# Patient Record
Sex: Male | Born: 1945 | Race: White | Hispanic: No | Marital: Married | Smoking: Former smoker
Health system: Southern US, Community
[De-identification: ages and names within clinical notes are randomized; demographics above are authoritative.]

## PROBLEM LIST (undated history)

## (undated) DIAGNOSIS — I10 Essential (primary) hypertension: Secondary | ICD-10-CM

## (undated) DIAGNOSIS — F32A Depression, unspecified: Secondary | ICD-10-CM

## (undated) DIAGNOSIS — I639 Cerebral infarction, unspecified: Secondary | ICD-10-CM

## (undated) DIAGNOSIS — K219 Gastro-esophageal reflux disease without esophagitis: Secondary | ICD-10-CM

## (undated) DIAGNOSIS — R339 Retention of urine, unspecified: Secondary | ICD-10-CM

## (undated) DIAGNOSIS — F329 Major depressive disorder, single episode, unspecified: Secondary | ICD-10-CM

## (undated) DIAGNOSIS — M549 Dorsalgia, unspecified: Secondary | ICD-10-CM

## (undated) DIAGNOSIS — H409 Unspecified glaucoma: Secondary | ICD-10-CM

## (undated) DIAGNOSIS — E785 Hyperlipidemia, unspecified: Secondary | ICD-10-CM

## (undated) HISTORY — PX: OTHER SURGICAL HISTORY: SHX169

## (undated) HISTORY — DX: Dorsalgia, unspecified: M54.9

## (undated) HISTORY — DX: Cerebral infarction, unspecified: I63.9

## (undated) HISTORY — DX: Essential (primary) hypertension: I10

## (undated) HISTORY — DX: Gastro-esophageal reflux disease without esophagitis: K21.9

## (undated) HISTORY — DX: Hyperlipidemia, unspecified: E78.5

## (undated) HISTORY — DX: Unspecified glaucoma: H40.9

## (undated) HISTORY — DX: Retention of urine, unspecified: R33.9

## (undated) HISTORY — PX: COLONOSCOPY: SHX174

## (undated) HISTORY — DX: Major depressive disorder, single episode, unspecified: F32.9

## (undated) HISTORY — DX: Depression, unspecified: F32.A

---

## 2003-05-21 ENCOUNTER — Encounter (INDEPENDENT_AMBULATORY_CARE_PROVIDER_SITE_OTHER): Payer: Self-pay | Admitting: Specialist

## 2003-05-21 ENCOUNTER — Ambulatory Visit (HOSPITAL_COMMUNITY): Admission: RE | Admit: 2003-05-21 | Discharge: 2003-05-21 | Payer: Self-pay | Admitting: Gastroenterology

## 2003-11-19 ENCOUNTER — Encounter: Admission: RE | Admit: 2003-11-19 | Discharge: 2003-11-19 | Payer: Self-pay | Admitting: Internal Medicine

## 2005-04-05 HISTORY — PX: KNEE ARTHROSCOPY: SUR90

## 2006-04-28 ENCOUNTER — Ambulatory Visit: Payer: Self-pay | Admitting: Family Medicine

## 2006-05-07 ENCOUNTER — Ambulatory Visit: Payer: Self-pay | Admitting: Family Medicine

## 2006-05-07 LAB — CONVERTED CEMR LAB
ALT: 19 units/L (ref 0–40)
AST: 25 units/L (ref 0–37)
Cholesterol: 250 mg/dL (ref 0–200)
Direct LDL: 157.7 mg/dL
Glucose, Bld: 89 mg/dL (ref 70–99)
HDL: 47.5 mg/dL (ref >39.0)
PSA: 3.21 ng/mL (ref 0.10–4.00)
Total CHOL/HDL Ratio: 5.3
Triglycerides: 115 mg/dL (ref 0–149)
VLDL: 23 mg/dL (ref 0–40)

## 2006-05-19 ENCOUNTER — Encounter (INDEPENDENT_AMBULATORY_CARE_PROVIDER_SITE_OTHER): Payer: Self-pay | Admitting: Family Medicine

## 2006-05-19 ENCOUNTER — Ambulatory Visit: Payer: Self-pay | Admitting: Family Medicine

## 2006-05-19 ENCOUNTER — Encounter: Payer: Self-pay | Admitting: Family Medicine

## 2006-05-20 ENCOUNTER — Ambulatory Visit (HOSPITAL_BASED_OUTPATIENT_CLINIC_OR_DEPARTMENT_OTHER): Admission: RE | Admit: 2006-05-20 | Discharge: 2006-05-20 | Payer: Self-pay | Admitting: Orthopedic Surgery

## 2006-06-18 ENCOUNTER — Telehealth (INDEPENDENT_AMBULATORY_CARE_PROVIDER_SITE_OTHER): Payer: Self-pay | Admitting: *Deleted

## 2006-07-27 ENCOUNTER — Ambulatory Visit: Payer: Self-pay | Admitting: Family Medicine

## 2006-08-02 ENCOUNTER — Telehealth (INDEPENDENT_AMBULATORY_CARE_PROVIDER_SITE_OTHER): Payer: Self-pay | Admitting: *Deleted

## 2006-08-02 LAB — CONVERTED CEMR LAB
ALT: 18 units/L (ref 0–53)
AST: 24 units/L (ref 0–37)
Cholesterol: 205 mg/dL (ref 0–200)
Direct LDL: 117 mg/dL
Glucose, Bld: 102 mg/dL — ABNORMAL HIGH (ref 70–99)
HDL: 48.2 mg/dL (ref >39.0)
PSA: 2.67 ng/mL (ref 0.10–4.00)
Total CHOL/HDL Ratio: 4.3
Triglycerides: 98 mg/dL (ref 0–149)
VLDL: 20 mg/dL (ref 0–40)

## 2006-10-14 ENCOUNTER — Ambulatory Visit: Payer: Self-pay | Admitting: Family Medicine

## 2006-10-14 DIAGNOSIS — M549 Dorsalgia, unspecified: Secondary | ICD-10-CM | POA: Insufficient documentation

## 2006-10-14 LAB — CONVERTED CEMR LAB: PSA: 1.92 ng/mL (ref 0.10–4.00)

## 2006-11-15 ENCOUNTER — Telehealth (INDEPENDENT_AMBULATORY_CARE_PROVIDER_SITE_OTHER): Payer: Self-pay | Admitting: *Deleted

## 2006-11-18 ENCOUNTER — Ambulatory Visit: Payer: Self-pay | Admitting: Family Medicine

## 2006-11-18 DIAGNOSIS — E785 Hyperlipidemia, unspecified: Secondary | ICD-10-CM | POA: Insufficient documentation

## 2006-11-18 LAB — CONVERTED CEMR LAB
Cholesterol, target level: 200 mg/dL
HDL goal, serum: 40 mg/dL
LDL Goal: 160 mg/dL

## 2007-01-19 ENCOUNTER — Telehealth (INDEPENDENT_AMBULATORY_CARE_PROVIDER_SITE_OTHER): Payer: Self-pay | Admitting: *Deleted

## 2008-01-30 ENCOUNTER — Ambulatory Visit: Payer: Self-pay | Admitting: Internal Medicine

## 2008-06-22 ENCOUNTER — Ambulatory Visit: Payer: Self-pay | Admitting: Internal Medicine

## 2008-12-24 ENCOUNTER — Encounter: Payer: Self-pay | Admitting: Family Medicine

## 2008-12-24 ENCOUNTER — Encounter: Payer: Self-pay | Admitting: Emergency Medicine

## 2008-12-24 ENCOUNTER — Ambulatory Visit: Payer: Self-pay | Admitting: Diagnostic Radiology

## 2008-12-25 ENCOUNTER — Inpatient Hospital Stay (HOSPITAL_COMMUNITY): Admission: EM | Admit: 2008-12-25 | Discharge: 2008-12-27 | Payer: Self-pay | Admitting: Emergency Medicine

## 2008-12-25 ENCOUNTER — Encounter: Payer: Self-pay | Admitting: Family Medicine

## 2008-12-25 ENCOUNTER — Telehealth: Payer: Self-pay | Admitting: Family Medicine

## 2008-12-25 DIAGNOSIS — Z8679 Personal history of other diseases of the circulatory system: Secondary | ICD-10-CM | POA: Insufficient documentation

## 2008-12-26 ENCOUNTER — Ambulatory Visit: Payer: Self-pay | Admitting: Vascular Surgery

## 2008-12-26 ENCOUNTER — Encounter (INDEPENDENT_AMBULATORY_CARE_PROVIDER_SITE_OTHER): Payer: Self-pay | Admitting: Internal Medicine

## 2008-12-26 ENCOUNTER — Encounter: Payer: Self-pay | Admitting: Family Medicine

## 2008-12-26 ENCOUNTER — Ambulatory Visit: Payer: Self-pay | Admitting: Cardiology

## 2008-12-26 ENCOUNTER — Telehealth (INDEPENDENT_AMBULATORY_CARE_PROVIDER_SITE_OTHER): Payer: Self-pay | Admitting: *Deleted

## 2008-12-27 ENCOUNTER — Ambulatory Visit: Payer: Self-pay | Admitting: Physical Medicine & Rehabilitation

## 2008-12-27 ENCOUNTER — Inpatient Hospital Stay (HOSPITAL_COMMUNITY)
Admission: RE | Admit: 2008-12-27 | Discharge: 2009-01-02 | Payer: Self-pay | Admitting: Physical Medicine & Rehabilitation

## 2009-01-02 ENCOUNTER — Encounter: Payer: Self-pay | Admitting: Family Medicine

## 2009-01-07 ENCOUNTER — Encounter
Admission: RE | Admit: 2009-01-07 | Discharge: 2009-03-05 | Payer: Self-pay | Admitting: Physical Medicine & Rehabilitation

## 2009-01-09 ENCOUNTER — Encounter (INDEPENDENT_AMBULATORY_CARE_PROVIDER_SITE_OTHER): Payer: Self-pay | Admitting: *Deleted

## 2009-01-18 ENCOUNTER — Ambulatory Visit: Payer: Self-pay | Admitting: Family Medicine

## 2009-01-18 DIAGNOSIS — I519 Heart disease, unspecified: Secondary | ICD-10-CM

## 2009-02-04 ENCOUNTER — Encounter
Admission: RE | Admit: 2009-02-04 | Discharge: 2009-02-05 | Payer: Self-pay | Admitting: Physical Medicine & Rehabilitation

## 2009-02-05 ENCOUNTER — Ambulatory Visit: Payer: Self-pay | Admitting: Physical Medicine & Rehabilitation

## 2009-02-05 ENCOUNTER — Encounter: Payer: Self-pay | Admitting: Family Medicine

## 2009-02-05 ENCOUNTER — Telehealth: Payer: Self-pay | Admitting: Family Medicine

## 2009-02-05 DIAGNOSIS — Z8659 Personal history of other mental and behavioral disorders: Secondary | ICD-10-CM

## 2009-02-06 ENCOUNTER — Ambulatory Visit: Payer: Self-pay | Admitting: Cardiology

## 2009-02-27 ENCOUNTER — Telehealth: Payer: Self-pay | Admitting: Cardiology

## 2009-03-18 ENCOUNTER — Ambulatory Visit: Payer: Self-pay | Admitting: Family Medicine

## 2009-03-22 ENCOUNTER — Telehealth: Payer: Self-pay | Admitting: Cardiology

## 2009-03-22 ENCOUNTER — Ambulatory Visit: Payer: Self-pay

## 2009-03-25 LAB — CONVERTED CEMR LAB
ALT: 27 units/L (ref 0–53)
AST: 30 units/L (ref 0–37)
Albumin: 3.6 g/dL (ref 3.5–5.2)
Alkaline Phosphatase: 64 units/L (ref 39–117)
BUN: 18 mg/dL (ref 6–23)
Bilirubin, Direct: 0.1 mg/dL (ref 0.0–0.3)
CO2: 31 meq/L (ref 19–32)
Calcium: 8.9 mg/dL (ref 8.4–10.5)
Chloride: 109 meq/L (ref 96–112)
Cholesterol: 150 mg/dL (ref 0–200)
Creatinine, Ser: 0.9 mg/dL (ref 0.4–1.5)
GFR calc non Af Amer: 89.91 mL/min (ref >60)
Glucose, Bld: 104 mg/dL — ABNORMAL HIGH (ref 70–99)
HDL: 61.6 mg/dL (ref >39.00)
LDL Cholesterol: 66 mg/dL (ref 0–99)
PSA: 2.59 ng/mL (ref 0.10–4.00)
Potassium: 4.2 meq/L (ref 3.5–5.1)
Sodium: 142 meq/L (ref 135–145)
Total Bilirubin: 0.8 mg/dL (ref 0.3–1.2)
Total CHOL/HDL Ratio: 2
Total Protein: 6.5 g/dL (ref 6.0–8.3)
Triglycerides: 110 mg/dL (ref 0.0–149.0)
VLDL: 22 mg/dL (ref 0.0–40.0)

## 2009-05-21 ENCOUNTER — Telehealth: Payer: Self-pay | Admitting: Cardiology

## 2009-05-23 ENCOUNTER — Encounter: Payer: Self-pay | Admitting: Cardiology

## 2009-05-30 ENCOUNTER — Encounter: Payer: Self-pay | Admitting: Family Medicine

## 2009-06-05 ENCOUNTER — Ambulatory Visit: Payer: Self-pay | Admitting: Cardiology

## 2009-06-05 ENCOUNTER — Ambulatory Visit (HOSPITAL_COMMUNITY): Admission: RE | Admit: 2009-06-05 | Discharge: 2009-06-05 | Payer: Self-pay | Admitting: Cardiology

## 2009-06-05 ENCOUNTER — Encounter: Payer: Self-pay | Admitting: Cardiology

## 2009-06-20 ENCOUNTER — Encounter: Payer: Self-pay | Admitting: Family Medicine

## 2009-07-01 ENCOUNTER — Telehealth (INDEPENDENT_AMBULATORY_CARE_PROVIDER_SITE_OTHER): Payer: Self-pay | Admitting: *Deleted

## 2009-07-09 ENCOUNTER — Ambulatory Visit: Payer: Self-pay | Admitting: Family Medicine

## 2009-07-15 LAB — CONVERTED CEMR LAB
ALT: 24 units/L (ref 0–53)
AST: 26 units/L (ref 0–37)
Albumin: 4 g/dL (ref 3.5–5.2)
Alkaline Phosphatase: 69 units/L (ref 39–117)
BUN: 25 mg/dL — ABNORMAL HIGH (ref 6–23)
Bilirubin, Direct: 0.2 mg/dL (ref 0.0–0.3)
CO2: 29 meq/L (ref 19–32)
Calcium: 9 mg/dL (ref 8.4–10.5)
Chloride: 106 meq/L (ref 96–112)
Cholesterol: 157 mg/dL (ref 0–200)
Creatinine, Ser: 0.9 mg/dL (ref 0.4–1.5)
GFR calc non Af Amer: 86.49 mL/min (ref >60)
Glucose, Bld: 102 mg/dL — ABNORMAL HIGH (ref 70–99)
HDL: 55.2 mg/dL (ref >39.00)
Hgb A1c MFr Bld: 5.8 % (ref 4.6–6.5)
LDL Cholesterol: 83 mg/dL (ref 0–99)
Potassium: 4.5 meq/L (ref 3.5–5.1)
Sodium: 141 meq/L (ref 135–145)
Total Bilirubin: 0.8 mg/dL (ref 0.3–1.2)
Total CHOL/HDL Ratio: 3
Total Protein: 6.5 g/dL (ref 6.0–8.3)
Triglycerides: 94 mg/dL (ref 0.0–149.0)
VLDL: 18.8 mg/dL (ref 0.0–40.0)

## 2009-07-19 ENCOUNTER — Ambulatory Visit: Payer: Self-pay | Admitting: Family Medicine

## 2009-07-19 DIAGNOSIS — M1990 Unspecified osteoarthritis, unspecified site: Secondary | ICD-10-CM | POA: Insufficient documentation

## 2009-07-19 DIAGNOSIS — I10 Essential (primary) hypertension: Secondary | ICD-10-CM | POA: Insufficient documentation

## 2009-07-25 ENCOUNTER — Encounter: Payer: Self-pay | Admitting: Cardiology

## 2009-07-26 ENCOUNTER — Encounter: Payer: Self-pay | Admitting: Family Medicine

## 2009-07-29 ENCOUNTER — Encounter: Payer: Self-pay | Admitting: Family Medicine

## 2009-07-29 ENCOUNTER — Emergency Department (HOSPITAL_COMMUNITY): Admission: EM | Admit: 2009-07-29 | Discharge: 2009-07-29 | Payer: Self-pay | Admitting: Emergency Medicine

## 2009-08-06 ENCOUNTER — Ambulatory Visit: Payer: Self-pay | Admitting: Family Medicine

## 2009-09-05 ENCOUNTER — Ambulatory Visit: Payer: Self-pay | Admitting: Family Medicine

## 2009-09-05 DIAGNOSIS — R350 Frequency of micturition: Secondary | ICD-10-CM

## 2009-09-05 HISTORY — PX: KNEE SURGERY: SHX244

## 2009-09-06 ENCOUNTER — Telehealth: Payer: Self-pay | Admitting: Family Medicine

## 2009-09-10 ENCOUNTER — Telehealth: Payer: Self-pay | Admitting: Family Medicine

## 2009-09-10 ENCOUNTER — Encounter: Payer: Self-pay | Admitting: Family Medicine

## 2009-09-13 ENCOUNTER — Ambulatory Visit: Payer: Self-pay | Admitting: Family Medicine

## 2009-09-13 ENCOUNTER — Telehealth (INDEPENDENT_AMBULATORY_CARE_PROVIDER_SITE_OTHER): Payer: Self-pay | Admitting: *Deleted

## 2009-09-16 LAB — CONVERTED CEMR LAB: Vit D, 25-Hydroxy: 34 ng/mL (ref 30–89)

## 2009-10-11 ENCOUNTER — Encounter: Payer: Self-pay | Admitting: Family Medicine

## 2009-12-10 ENCOUNTER — Ambulatory Visit: Payer: Self-pay | Admitting: Family Medicine

## 2009-12-10 DIAGNOSIS — M169 Osteoarthritis of hip, unspecified: Secondary | ICD-10-CM

## 2009-12-10 DIAGNOSIS — M1610 Unilateral primary osteoarthritis, unspecified hip: Secondary | ICD-10-CM | POA: Insufficient documentation

## 2009-12-10 DIAGNOSIS — R7309 Other abnormal glucose: Secondary | ICD-10-CM

## 2009-12-16 HISTORY — PX: TOTAL HIP ARTHROPLASTY: SHX124

## 2009-12-17 ENCOUNTER — Encounter: Payer: Self-pay | Admitting: Family Medicine

## 2009-12-18 ENCOUNTER — Telehealth (INDEPENDENT_AMBULATORY_CARE_PROVIDER_SITE_OTHER): Payer: Self-pay | Admitting: *Deleted

## 2009-12-19 ENCOUNTER — Encounter: Payer: Self-pay | Admitting: Family Medicine

## 2009-12-23 LAB — CONVERTED CEMR LAB
ALT: 19 units/L (ref 0–53)
AST: 27 units/L (ref 0–37)
Albumin: 3.8 g/dL (ref 3.5–5.2)
Alkaline Phosphatase: 53 units/L (ref 39–117)
BUN: 26 mg/dL — ABNORMAL HIGH (ref 6–23)
Basophils Absolute: 0.1 10*3/uL (ref 0.0–0.1)
Basophils Relative: 0.8 % (ref 0.0–3.0)
Bilirubin Urine: NEGATIVE
Bilirubin, Direct: 0.1 mg/dL (ref 0.0–0.3)
CO2: 28 meq/L (ref 19–32)
Calcium: 8.8 mg/dL (ref 8.4–10.5)
Chloride: 105 meq/L (ref 96–112)
Cholesterol: 156 mg/dL (ref 0–200)
Creatinine, Ser: 0.9 mg/dL (ref 0.4–1.5)
Creatinine,U: 183.4 mg/dL
Eosinophils Absolute: 0.2 10*3/uL (ref 0.0–0.7)
Eosinophils Relative: 3 % (ref 0.0–5.0)
GFR calc non Af Amer: 86.38 mL/min (ref >60.00)
Glucose, Bld: 105 mg/dL — ABNORMAL HIGH (ref 70–99)
HCT: 41.8 % (ref 39.0–52.0)
HDL: 51.6 mg/dL (ref >39.00)
Hemoglobin: 14.1 g/dL (ref 13.0–17.0)
Hgb A1c MFr Bld: 5.7 % (ref 4.6–6.5)
Ketones, ur: NEGATIVE mg/dL
LDL Cholesterol: 89 mg/dL (ref 0–99)
Leukocytes, UA: NEGATIVE
Lymphocytes Relative: 35.3 % (ref 12.0–46.0)
Lymphs Abs: 2.5 10*3/uL (ref 0.7–4.0)
MCHC: 33.8 g/dL (ref 30.0–36.0)
MCV: 93.3 fL (ref 78.0–100.0)
Microalb Creat Ratio: 0.7 mg/g (ref 0.0–30.0)
Microalb, Ur: 1.2 mg/dL (ref 0.0–1.9)
Monocytes Absolute: 0.6 10*3/uL (ref 0.1–1.0)
Monocytes Relative: 9.1 % (ref 3.0–12.0)
Neutro Abs: 3.6 10*3/uL (ref 1.4–7.7)
Neutrophils Relative %: 51.8 % (ref 43.0–77.0)
Nitrite: NEGATIVE
Platelets: 192 10*3/uL (ref 150.0–400.0)
Potassium: 4.3 meq/L (ref 3.5–5.1)
RBC: 4.49 M/uL (ref 4.22–5.81)
RDW: 13.9 % (ref 11.5–14.6)
Sodium: 141 meq/L (ref 135–145)
Specific Gravity, Urine: 1.025 (ref 1.000–1.030)
Total Bilirubin: 0.6 mg/dL (ref 0.3–1.2)
Total CHOL/HDL Ratio: 3
Total Protein, Urine: NEGATIVE mg/dL
Total Protein: 6.2 g/dL (ref 6.0–8.3)
Triglycerides: 76 mg/dL (ref 0.0–149.0)
Urine Glucose: NEGATIVE mg/dL
Urobilinogen, UA: 0.2 (ref 0.0–1.0)
VLDL: 15.2 mg/dL (ref 0.0–40.0)
WBC: 7 10*3/uL (ref 4.5–10.5)
pH: 6 (ref 5.0–8.0)

## 2009-12-24 ENCOUNTER — Ambulatory Visit: Payer: Self-pay | Admitting: Family Medicine

## 2010-01-30 ENCOUNTER — Encounter: Payer: Self-pay | Admitting: Family Medicine

## 2010-02-02 LAB — CONVERTED CEMR LAB
ALT: 23 units/L (ref 0–53)
AST: 27 units/L (ref 0–37)
Albumin: 3.8 g/dL (ref 3.5–5.2)
Alkaline Phosphatase: 58 units/L (ref 39–117)
BUN: 23 mg/dL (ref 6–23)
Basophils Absolute: 0 10*3/uL (ref 0.0–0.1)
Basophils Relative: 0.8 % (ref 0.0–3.0)
Bilirubin, Direct: 0.2 mg/dL (ref 0.0–0.3)
CO2: 29 meq/L (ref 19–32)
Calcium: 8.8 mg/dL (ref 8.4–10.5)
Chloride: 103 meq/L (ref 96–112)
Cholesterol: 135 mg/dL (ref 0–200)
Creatinine, Ser: 1 mg/dL (ref 0.4–1.5)
Eosinophils Absolute: 0.1 10*3/uL (ref 0.0–0.7)
Eosinophils Relative: 1.7 % (ref 0.0–5.0)
GFR calc non Af Amer: 84.35 mL/min (ref >60)
Glucose, Bld: 91 mg/dL (ref 70–99)
HCT: 42.3 % (ref 39.0–52.0)
HDL: 50.4 mg/dL (ref >39.00)
Hemoglobin: 14.4 g/dL (ref 13.0–17.0)
Hgb A1c MFr Bld: 5.8 % (ref 4.6–6.5)
LDL Cholesterol: 73 mg/dL (ref 0–99)
Lymphocytes Relative: 29.2 % (ref 12.0–46.0)
Lymphs Abs: 1.9 10*3/uL (ref 0.7–4.0)
MCHC: 34.1 g/dL (ref 30.0–36.0)
MCV: 93.6 fL (ref 78.0–100.0)
Monocytes Absolute: 0.6 10*3/uL (ref 0.1–1.0)
Monocytes Relative: 9.1 % (ref 3.0–12.0)
Neutro Abs: 3.8 10*3/uL (ref 1.4–7.7)
Neutrophils Relative %: 59.2 % (ref 43.0–77.0)
PSA: 2.64 ng/mL (ref 0.10–4.00)
Platelets: 201 10*3/uL (ref 150.0–400.0)
Potassium: 4.7 meq/L (ref 3.5–5.1)
RBC: 4.52 M/uL (ref 4.22–5.81)
RDW: 13.7 % (ref 11.5–14.6)
Sodium: 140 meq/L (ref 135–145)
TSH: 1.53 microintl units/mL (ref 0.35–5.50)
Total Bilirubin: 1 mg/dL (ref 0.3–1.2)
Total CHOL/HDL Ratio: 3
Total Protein: 6.2 g/dL (ref 6.0–8.3)
Triglycerides: 59 mg/dL (ref 0.0–149.0)
VLDL: 11.8 mg/dL (ref 0.0–40.0)
WBC: 6.4 10*3/uL (ref 4.5–10.5)

## 2010-02-04 NOTE — Assessment & Plan Note (Signed)
Summary: discuss issue from specialist/cbs   Vital Signs:  Patient profile:   65 year old male Height:      73.5 inches Weight:      196 pounds Temp:     98.4 degrees F oral Pulse rate:   68 / minute BP sitting:   130 / 100  (left arm)  Vitals Entered By: Jeremy Johann CMA (July 19, 2009 8:18 AM)  Serial Vital Signs/Assessments:  Time      Position  BP       Pulse  Resp  Temp     By 8:36 AM             128/100                        Loreen Freud DO  CC: discuss issue from specialist, blood sugars Comments REVIEWED MED LIST, PATIENT AGREED DOSE AND INSTRUCTION CORRECT    History of Present Illness: Pt here to discuss issues with ortho.  He is having surgery in [place], [place]---He is going to have hip resurfacing and no one here does that.  He will also need partial knee replacement on the Right.  No other complaints.  Preventive Screening-Counseling & Management  Alcohol-Tobacco     Alcohol drinks/day: <1     Smoking Status: never  Caffeine-Diet-Exercise     Does Patient Exercise: yes     Type of exercise: swimming, weights, bike     Exercise (avg: min/session): >60     Times/week: 5     Exercise Counseling: to improve exercise regimen  Current Medications (verified): 1)  Zoloft 100 Mg  Tabs (Sertraline Hcl) .... Take One Tablet Daily 2)  Lovaza 1 Gm Caps (Omega-3-Acid Ethyl Esters) .... 2 By Mouth Two Times A Day 3)  Glucosamine-Chondroitin 1500-1200 Mg/65ml  Liqd (Glucosamine-Chondroitin) 4)  Ecotrin 325 Mg Tbec (Aspirin) .Marland Kitchen.. 1 By Mouth Once Daily 5)  Sam-E 200 Mg Tabs (S-Adenosylmethionine) 6)  Multivitamins  Tabs (Multiple Vitamin) .Marland Kitchen.. 1 By Mouth Once Daily 7)  Crestor 20 Mg Tabs (Rosuvastatin Calcium) .Marland Kitchen.. 1 By Mouth At Bedtime. 8)  Lisinopril 10 Mg Tabs (Lisinopril) .Marland Kitchen.. 1 By Mouth Once Daily  Allergies (verified): No Known Drug Allergies  Past History:  Past medical, surgical, family and social histories (including risk factors) reviewed for relevance to  current acute and chronic problems.  Past Medical History: Reviewed history from 02/06/2009 and no changes required. 1. HYPERLIPIDEMIA (ICD-272.4) 2. 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  Carotid dopplers showed no evidence of stenosis.   3.  DEPRESSION, HX OF (ICD-V11.8) 4. BACK PAIN (ICD-724.5) 5. History of right eye retinal detachment.   6. Echo (12/10): Technically difficult study.  EF 50-55%.  Grade I diastolic dysfucntion.   Past Surgical History: Reviewed history from 02/05/2009 and no changes required.  Right knee arthroscopy.   Family History: Reviewed history from 02/05/2009 and no changes required. Family History of Stroke M 1st degree relative--  42s--- ICH Family History High cholesterol  Social History: Reviewed history from 02/06/2009 and no changes required. Married, lives at [place].  Quit tobacco 1969  Alcohol use-no Drug use-no Regular exercise-yes Occupation: Systems analyst  Review of Systems      See HPI  Physical Exam  General:  Well-developed,well-nourished,in no acute distress; alert,appropriate and cooperative throughout examination Lungs:  Normal respiratory effort, chest expands symmetrically. Lungs are clear to auscultation, no crackles or wheezes. Heart:  normal rate and no murmur.   Extremities:  No clubbing, cyanosis, edema, or deformity noted with normal full range of motion of all joints.   Psych:  Oriented X3 and normally interactive.     Impression & Recommendations:  Problem # 1:  UNSPECIFIED ESSENTIAL HYPERTENSION (ICD-401.9)  His updated medication list for this problem includes:    Lisinopril 10 Mg Tabs (Lisinopril) .Marland Kitchen... 1 by mouth once daily  BP today: 130/100 Prior BP: 138/90 (02/06/2009)  Prior 10 Yr Risk Heart Disease: Not enough information (11/18/2006)  Labs Reviewed: K+: 4.5  (07/09/2009) Creat: : 0.9 (07/09/2009)   Chol: 157 (07/09/2009)   HDL: 55.20 (07/09/2009)   LDL: 83 (07/09/2009)   TG: 94.0 (07/09/2009)  Problem # 2:  DEGENERATIVE JOINT DISEASE, ADVANCED (ICD-715.90)  rto for surgical clearance for R knee replacement and b/l hip resurfacing His updated medication list for this problem includes:    Ecotrin 325 Mg Tbec (Aspirin) .Marland Kitchen... 1 by mouth once daily  Discussed use of medications, application of heat or cold, and exercises.   Complete Medication List: 1)  Zoloft 100 Mg Tabs (Sertraline hcl) .... Take one tablet daily 2)  Lovaza 1 Gm Caps (Omega-3-acid ethyl esters) .... 2 by mouth two times a day 3)  Glucosamine-chondroitin 1500-1200 Mg/21ml Liqd (Glucosamine-chondroitin) 4)  Ecotrin 325 Mg Tbec (Aspirin) .Marland Kitchen.. 1 by mouth once daily 5)  Sam-e 200 Mg Tabs (S-adenosylmethionine) 6)  Multivitamins Tabs (Multiple vitamin) .Marland Kitchen.. 1 by mouth once daily 7)  Crestor 20 Mg Tabs (Rosuvastatin calcium) .Marland Kitchen.. 1 by mouth at bedtime. 8)  Lisinopril 10 Mg Tabs (Lisinopril) .Marland Kitchen.. 1 by mouth once daily  Patient Instructions: 1)  Please schedule a follow-up appointment in 2 weeks.  2)  Pt needs surgical clearance/CPE before september Prescriptions: LISINOPRIL 10 MG TABS (LISINOPRIL) 1 by mouth once daily  #30 x 2   Entered and Authorized by:   Loreen Freud DO   Signed by:   Loreen Freud DO on 07/19/2009   Method used:   Electronically to        [facility] [phone]* (retail)       [address]       [place], [place]  [address]       Ph: [phone]       Fax: [phone]   RxID:   [id number]

## 2010-02-04 NOTE — Progress Notes (Signed)
Summary: lab [phone]  Phone Note Outgoing Call   Call placed by: Army Fossa CMA,  July 01, 2009 8:41 AM Reason for Call: Confirm/change Appt Summary of Call: Pt needs to schedule labwork:  -272.4  790.6  bmp, hgba1c, hep, lipid    Follow-up for Phone Call        lm am to schedule appt  .Marland KitchenOkey [place]  June 30, 2009 10:43 PM  lmom to schedule fasting lab .Marland KitchenOkey [place]  July 03, 2009 1:43 PM  patient returned call - lab appt [phone] .Marland KitchenOkey [place]  July 04, 2009 9:27 AM

## 2010-02-04 NOTE — Miscellaneous (Signed)
Summary: Case Mgmt. Form/[facility]  Case Mgmt. Form/[facility]   Imported By: Lanelle Bal 01/10/2009 09:02:39  _____________________________________________________________________  External Attachment:    Type:   Image     Comment:   External Document

## 2010-02-04 NOTE — Letter (Signed)
Summary: Surgical Clearance  Surgical Clearance   Imported By: Marylou Mccoy 08/02/2009 15:57:30  _____________________________________________________________________  External Attachment:    Type:   Image     Comment:   External Document

## 2010-02-04 NOTE — Letter (Signed)
Summary: Claudette Laws MD  Claudette Laws MD   Imported By: Lanelle Bal 08/15/2009 09:37:35  _____________________________________________________________________  External Attachment:    Type:   Image     Comment:   External Document

## 2010-02-04 NOTE — Letter (Signed)
Summary: Surgical Clearance/[facility]  Surgical Clearance/[facility]   Imported By: Lanelle Bal 10/07/2009 13:03:30  _____________________________________________________________________  External Attachment:    Type:   Image     Comment:   External Document

## 2010-02-04 NOTE — Progress Notes (Signed)
Summary: NEEDS PRESCRIPTION FOR CRESTOR 20 MG  Phone Note Call from Patient Call back at Home Phone [phone]   Caller: Patient Summary of Call: PATIENT NEEDS PRESCRIPTION FOR CRESTOR 20 MG---GIVEN TO HIM WHEN HE WAS IN THE HOSPITAL  HE IS OUT OF MEDICATION --TOOK LAST ONE TWO NIGHTS AGO  PLEASE CALL IN [facility] ON [address], THEN CALL HIM TO CONFIRM THAT PRESCRIPTION HAS BEEN CALLED IN Initial call taken by: Jerolyn Shin,  February 05, 2009 11:47 AM  Follow-up for Phone Call        We have on lovaza? Army Fossa CMA  February 05, 2009 12:02 PM   Additional Follow-up for Phone Call Additional follow up Details #1::        He is probably on both--- ok to call in crestor 20 mg #30  2 refills--- ov 3 months with fasting labs Additional Follow-up by: Loreen Freud DO,  February 05, 2009 12:37 PM    Additional Follow-up for Phone Call Additional follow up Details #2::    Called in medication. Army Fossa CMA  February 05, 2009 3:32 PM  Pt is aware.   New/Updated Medications: CRESTOR 20 MG TABS (ROSUVASTATIN CALCIUM) 1 by mouth at bedtime. Prescriptions: CRESTOR 20 MG TABS (ROSUVASTATIN CALCIUM) 1 by mouth at bedtime.  #30 x 2   Entered by:   Army Fossa CMA   Authorized by:   Loreen Freud DO   Signed by:   Army Fossa CMA on 02/06/2009   Method used:   Electronically to        [facility] [phone]* (retail)       [address]       [place], [place]  [address]       Ph: [phone]       Fax: [phone]   RxID:   [id number]   Appended Document: NEEDS PRESCRIPTION FOR CRESTOR 20 MG Was not sent electronically- called in on 02/05/09. I called and cancelled the rx at [facility] [address].

## 2010-02-04 NOTE — Letter (Signed)
Summary: Generic Letter  [facility] [place]  [address] [place], [place] [address]   Phone: [phone]  Fax: [phone]    09/10/2009  RE: Antonio Stephens   DOB  12/12/1945 [address] [place], [place]  [address]  To Whom It May Concern:  The above patient is cleared for surgery.  Enclosed are all the records from his recent exam.  Feel free to call with any further questions.            Sincerely,   Loreen Freud DO

## 2010-02-04 NOTE — Progress Notes (Signed)
Summary: GASTRO REFERRAL  Phone Note Outgoing Call   Call placed by: Magdalen Spatz [facility],  September 06, 2009 3:07 PM Call placed to: Specialist Summary of Call: IN REFERENCE TO GASTROENTEROLOGY REFERRAL....Marland KitchenPER [facility] THIS PATIENT'S LAST COLON WAS WITH DR. GANEM IN 2005 & HE IS NOT DUE UNTIL 2015.  I S/W PT HE IS AWARE Magdalen Spatz [facility]  September 06, 2009 3:06 PM

## 2010-02-04 NOTE — Progress Notes (Signed)
Summary: ? schedule TEE  Phone Note Outgoing Call   Call placed by: Katina Dung, RN, BSN,  February 27, 2009 3:06 PM Call placed to: Patient Summary of Call: ?schedule TEE  Follow-up for Phone Call        discussed with pt--pt had appt with Dr Pearlean Brownie 02-21-09-Dr Pearlean Brownie was not available to see pt at that time and appt was reschedued to 03-08-09--pt will call me back after he sees [facility] 03-08-09 and let me know his decision about scheduling the TEE

## 2010-02-04 NOTE — Assessment & Plan Note (Signed)
Summary: rto 2 weeks/cbs   Vital Signs:  Patient profile:   65 year old male Height:      73.5 inches (186.69 cm) Weight:      194.25 pounds (88.30 kg) BMI:     25.37 Temp:     98.5 degrees F (36.94 degrees C) oral BP sitting:   122 / 86  (left arm) Cuff size:   regular  Vitals Entered By: Lucious Groves CMA (August 06, 2009 4:40 PM) CC: 2 eek rtn ov./kb Is Patient Diabetic? No Pain Assessment Patient in pain? no      Comments Patient ntoes that he is in a research study right now. He also made me aware that 1 week ago he had an episode of nausea and being off balance, at which time he took himself to the ER. Per patient, everything checked out fine./kb   History of Present Illness: Pt here f/u ER for dizziness---MRI of brain normal.  Pt has felt fine since.    Current Medications (verified): 1)  Zoloft 100 Mg  Tabs (Sertraline Hcl) .... Take One Tablet Daily 2)  Lovaza 1 Gm Caps (Omega-3-Acid Ethyl Esters) .... 2 By Mouth Two Times A Day 3)  Glucosamine-Chondroitin 1500-1200 Mg/47ml  Liqd (Glucosamine-Chondroitin) 4)  Ecotrin 325 Mg Tbec (Aspirin) .Marland Kitchen.. 1 By Mouth Once Daily 5)  Sam-E 200 Mg Tabs (S-Adenosylmethionine) 6)  Multivitamins  Tabs (Multiple Vitamin) .Marland Kitchen.. 1 By Mouth Once Daily 7)  Crestor 20 Mg Tabs (Rosuvastatin Calcium) .Marland Kitchen.. 1 By Mouth At Bedtime. 8)  Lisinopril 10 Mg Tabs (Lisinopril) .Marland Kitchen.. 1 By Mouth Once Daily  Allergies (verified): No Known Drug Allergies  Past History:  Past Surgical History: Last updated: 02/05/2009  Right knee arthroscopy.   Family History: Last updated: 02/05/2009 Family History of Stroke M 1st degree relative--  59s--- ICH Family History High cholesterol  Social History: Last updated: 02/06/2009 Married, lives at [place].  Quit tobacco 1969                                                                                Alcohol use-no Drug use-no Regular exercise-yes Occupation: Systems analyst  Risk Factors: Alcohol Use:  <1 (07/19/2009) Exercise: yes (07/19/2009)  Risk Factors: Smoking Status: never (07/19/2009)  Past Medical History: 1. HYPERLIPIDEMIA (ICD-272.4) 2. 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  Carotid dopplers showed no evidence of stenosis.   3.  DEPRESSION, HX OF (ICD-V11.8) 4. BACK PAIN (ICD-724.5) 5. History of right eye retinal detachment.   6. Echo (12/10): Technically difficult study.  EF 50-55%.  Grade I diastolic dysfucntion.  Hypertension  Family History: Reviewed history from 02/05/2009 and no changes required. Family History of Stroke M 1st degree relative--  94s--- ICH Family History High cholesterol  Social History: Reviewed history from 02/06/2009 and no changes required. Married, lives at [place].  Quit tobacco 1969  Alcohol use-no Drug use-no Regular exercise-yes Occupation: Systems analyst  Review of Systems      See HPI  Physical Exam  General:  Well-developed,well-nourished,in no acute distress; alert,appropriate and cooperative throughout examination Lungs:  Normal respiratory effort, chest expands symmetrically. Lungs are clear to auscultation, no crackles or wheezes. Heart:  Normal rate and regular rhythm. S1 and S2 normal without gallop, murmur, click, rub or other extra sounds. Extremities:  No clubbing, cyanosis, edema, or deformity noted with normal full range of motion of all joints.   Psych:  Oriented X3, normally interactive, good eye contact, not anxious appearing, and not depressed appearing.     Impression & Recommendations:  Problem # 1:  HYPERTENSION (ICD-401.9)  His updated medication list for this problem includes:    Lisinopril 10 Mg Tabs (Lisinopril) .Marland Kitchen... 1 by mouth once daily  BP today: 122/86 Prior BP: 130/100 (07/19/2009)  Prior 10 Yr Risk Heart Disease: Not enough information (11/18/2006)  Labs  Reviewed: K+: 4.5 (07/09/2009) Creat: : 0.9 (07/09/2009)   Chol: 157 (07/09/2009)   HDL: 55.20 (07/09/2009)   LDL: 83 (07/09/2009)   TG: 94.0 (07/09/2009)  Complete Medication List: 1)  Zoloft 100 Mg Tabs (Sertraline hcl) .... Take one tablet daily 2)  Lovaza 1 Gm Caps (Omega-3-acid ethyl esters) .... 2 by mouth two times a day 3)  Glucosamine-chondroitin 1500-1200 Mg/6ml Liqd (Glucosamine-chondroitin) 4)  Ecotrin 325 Mg Tbec (Aspirin) .Marland Kitchen.. 1 by mouth once daily 5)  Sam-e 200 Mg Tabs (S-adenosylmethionine) 6)  Multivitamins Tabs (Multiple vitamin) .Marland Kitchen.. 1 by mouth once daily 7)  Crestor 20 Mg Tabs (Rosuvastatin calcium) .Marland Kitchen.. 1 by mouth at bedtime. 8)  Lisinopril 10 Mg Tabs (Lisinopril) .Marland Kitchen.. 1 by mouth once daily  Patient Instructions: 1)  Please schedule a follow-up appointment in 3 months .

## 2010-02-04 NOTE — Progress Notes (Signed)
Summary: Surgical Clearance Statement  Phone Note From Other Clinic Call back at [phone], [phone]   Caller: Dr.Gross-Orthro, [place] Summary of Call: Needs a statment that patient is cleared for surgery faxed to: [phone], along with labs,ekg,last ov, and chest xray   Chrae Sharp [facility] CMA  September 13, 2009 10:20 AM   Follow-up for Phone Call        Per Kim(Dr.Lowne's assistant)she will fax all current info today Follow-up by: Shonna Chock CMA,  September 13, 2009 10:22 AM

## 2010-02-04 NOTE — Assessment & Plan Note (Signed)
Summary: follow up/drb   Vital Signs:  Patient profile:   65 year old male Height:      73.5 inches Weight:      191.50 pounds BMI:     25.01 Temp:     98.0 degrees F oral Pulse rate:   76 / minute Pulse rhythm:   regular BP sitting:   112 / 80  (left arm) Cuff size:   regular  Vitals Entered By: Army Fossa CMA (January 18, 2009 3:13 PM) CC: Follow up from hospital. (was started on crestor- will look for dosage in echart) pt states he still walks to the right some.    History of Present Illness: Pt here f/u hospital ---see d/c summary Pt in Rehab at [facility].   ECHO done in hospital---+ diastolic dysfunction----recc TEE Carotid dopplers done---normal  Preventive Screening-Counseling & Management  Alcohol-Tobacco     Alcohol drinks/day: <1     Smoking Status: never  Caffeine-Diet-Exercise     Does Patient Exercise: yes     Type of exercise: swimming, weights, bike     Exercise (avg: min/session): >60     Times/week: 5     Exercise Counseling: to improve exercise regimen      Drug Use:  no.    Current Medications (verified): 1)  Zoloft 100 Mg  Tabs (Sertraline Hcl) .... Take One Tablet Daily 2)  Lovaza 1 Gm Caps (Omega-3-Acid Ethyl Esters) .... 2 By Mouth Two Times A Day 3)  Glucosamine-Chondroitin 1500-1200 Mg/51ml  Liqd (Glucosamine-Chondroitin) 4)  Omega-3 350 Mg Caps (Omega-3 Fatty Acids) 5)  Ecotrin 325 Mg Tbec (Aspirin) .Marland Kitchen.. 1 By Mouth Once Daily 6)  Sam-E 200 Mg Tabs (S-Adenosylmethionine) 7)  Multivitamins  Tabs (Multiple Vitamin) .Marland Kitchen.. 1 By Mouth Once Daily  Allergies (verified): No Known Drug Allergies  Past History:  Family History: Last updated: 01/18/2009 Family History of Stroke M 1st degree relative--  7s--- ICH Family History High cholesterol  Social History: Last updated: 01/18/2009 Married Never Smoked Alcohol use-no Drug use-no Regular exercise-yes Occupation: Systems analyst  Risk Factors: Alcohol Use: <1 (01/18/2009) Exercise:  yes (01/18/2009)  Risk Factors: Smoking Status: never (01/18/2009)  Past medical, surgical, family and social histories (including risk factors) reviewed for relevance to current acute and chronic problems.  Past Medical History: Hyperlipidemia Cerebrovascular accident, hx of (12/25/2008)  Family History: Reviewed history and no changes required. Family History of Stroke M 1st degree relative--  27s--- ICH Family History High cholesterol  Social History: Reviewed history and no changes required. Married Never Smoked Alcohol use-no Drug use-no Regular exercise-yes Occupation: Systems analyst Drug Use:  no Does Patient Exercise:  yes Occupation:  employed  Review of Systems      See HPI  Physical Exam  General:  Well-developed,well-nourished,in no acute distress; alert,appropriate and cooperative throughout examination Eyes:  pupils equal, pupils round, and pupils reactive to light.   Lungs:  Normal respiratory effort, chest expands symmetrically. Lungs are clear to auscultation, no crackles or wheezes. Heart:  normal rate and no murmur.   Msk:  normal ROM.   Extremities:  No clubbing, cyanosis, edema, or deformity noted with normal full range of motion of all joints.   Neurologic:  alert & oriented X3, cranial nerves II-XII intact, strength normal in all extremities, and gait normal.   Psych:  Oriented X3, memory intact for recent and remote, normally interactive, good eye contact, not anxious appearing, and not depressed appearing.     Impression & Recommendations:  Problem # 1:  HYPERLIPIDEMIA (ICD-272.4) Assessment New Pt is on Crestor too---  we need dose His updated medication list for this problem includes:    Lovaza 1 Gm Caps (Omega-3-acid ethyl esters) .Marland Kitchen... 2 by mouth two times a day  Labs Reviewed: SGOT: 24 (07/27/2006)   SGPT: 18 (07/27/2006)  Lipid Goals: Chol Goal: 200 (11/18/2006)   HDL Goal: 40 (11/18/2006)   LDL Goal: 160 (11/18/2006)   TG Goal:  150 (11/18/2006)  Prior 10 Yr Risk Heart Disease: Not enough information (11/18/2006)   HDL:48.2 (07/27/2006), 47.5 (05/07/2006)  LDL:DEL (07/27/2006), DEL (05/07/2006)  Chol:205 (07/27/2006), 250 (05/07/2006)  Trig:98 (07/27/2006), 115 (05/07/2006)  Problem # 2:  CEREBROVASCULAR ACCIDENT, HX OF (ICD-V12.50) Assessment: Improved  f/u neuro  Orders: Cardiology Referral (Cardiology)  Complete Medication List: 1)  Zoloft 100 Mg Tabs (Sertraline hcl) .... Take one tablet daily 2)  Lovaza 1 Gm Caps (Omega-3-acid ethyl esters) .... 2 by mouth two times a day 3)  Glucosamine-chondroitin 1500-1200 Mg/60ml Liqd (Glucosamine-chondroitin) 4)  Omega-3 350 Mg Caps (Omega-3 fatty acids) 5)  Ecotrin 325 Mg Tbec (Aspirin) .Marland Kitchen.. 1 by mouth once daily 6)  Sam-e 200 Mg Tabs (S-adenosylmethionine) 7)  Multivitamins Tabs (Multiple vitamin) .Marland Kitchen.. 1 by mouth once daily  Patient Instructions: 1)  fasting labs  in March ----  272.4  lipid, hep, bmp  Prescriptions: LOVAZA 1 GM CAPS (OMEGA-3-ACID ETHYL ESTERS) 2 by mouth two times a day  #120 x 3   Entered and Authorized by:   Loreen Freud DO   Signed by:   Loreen Freud DO on 01/18/2009   Method used:   Electronically to        [facility] [phone]* (retail)       [address]       [place], [place]  [address]       Ph: [phone]       Fax: [phone]   RxID:   [id number]

## 2010-02-04 NOTE — Assessment & Plan Note (Signed)
Summary: cpx - lab/cbs   Vital Signs:  Patient profile:   65 year old male Height:      73.5 inches Weight:      194.4 pounds Temp:     98.8 degrees F oral Pulse rate:   72 / minute Pulse rhythm:   regular BP sitting:   142 / 90  (left arm)  Vitals Entered By: Almeta Monas CMA Duncan Dull) (September 05, 2009 9:11 AM) CC: cpx/fasting  Does patient need assistance? Functional Status Self care, Cook/clean, Shopping, Social activities Ambulation Normal Comments pt can do all ADLs and is able to read and write   Vision Screening:Left eye w/o correction: 20 / 15 Right Eye w/o correction: 20 / 20 Both eyes w/o correction:  20/ 15       Vision Comments: pt sees optho q1y wears reading glasses only Blurred vision on in the right eye  Vision Entered By: Almeta Monas CMA Duncan Dull) (September 05, 2009 10:10 AM) 40db HL: Left  Right  Audiometry Comment: grossly normal    History of Present Illness: Pt here for cpe and labs.  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  Pt c/o being able to feel ball of foot on Left foot and not the right ---it does not hurt.  It feels like there is a swelling there.      Preventive Screening-Counseling & Management  Alcohol-Tobacco     Alcohol drinks/day: <1     Smoking Status: never  Caffeine-Diet-Exercise     Does Patient Exercise: yes     Type of exercise: swimming, weights, bike     Exercise (avg: min/session): >60     Times/week: 5     Exercise Counseling: to improve exercise regimen  Hep-HIV-STD-Contraception     Dental Visit-last 6 months yes     Dental Care Counseling: to seek dental care; no dental care within six months  Safety-Violence-Falls     Seat Belt Use: yes     Firearms in the Home: firearms in the home     Firearm Counseling: not indicated; uses recommended firearm safety measures     Smoke Detectors: yes     Smoke Detector Counseling: no     Violence in the Home: no risk noted     Sexual Abuse: no  Fall Risk: no      Sexual History:  currently monogamous.    Current Medications (verified): 1)  Zoloft 100 Mg  Tabs (Sertraline Hcl) .... Take One Tablet Daily 2)  Lovaza 1 Gm Caps (Omega-3-Acid Ethyl Esters) .... 2 By Mouth Two Times A Day 3)  Glucosamine-Chondroitin 1500-1200 Mg/79ml  Liqd (Glucosamine-Chondroitin) 4)  Ecotrin 325 Mg Tbec (Aspirin) .Marland Kitchen.. 1 By Mouth Once Daily 5)  Sam-E 200 Mg Tabs (S-Adenosylmethionine) 6)  Multivitamins  Tabs (Multiple Vitamin) .Marland Kitchen.. 1 By Mouth Once Daily 7)  Crestor 20 Mg Tabs (Rosuvastatin Calcium) .Marland Kitchen.. 1 By Mouth At Bedtime. 8)  Lisinopril 10 Mg Tabs (Lisinopril) .Marland Kitchen.. 1 By Mouth Once Daily 9)  Fosamax 70 Mg Tabs (Alendronate Sodium) .Marland Kitchen.. 1 By Mouth Every Week 10)  Actos 45 Mg Tabs (Pioglitazone Hcl) .Marland Kitchen.. 1 By Mouth Daily  Allergies (verified): No Known Drug Allergies  Past History:  Past Surgical History:  Right knee arthroscopy. 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 Family History of Stroke M 1st degree relative--  28s--- ICH Family History High cholesterol  Social  History: Reviewed history from 02/06/2009 and no changes required. Married, lives at [place].  Quit tobacco 1969                                                                                Alcohol use-no Drug use-no Regular exercise-yes Occupation: Public librarian Care w/in 6 mos.:  yes Seat Belt Use:  yes Fall Risk:  no Sexual History:  currently monogamous  Review of Systems      See HPI General:  Denies chills, fatigue, fever, loss of appetite, malaise, sleep disorder, sweats, weakness, and weight loss. Eyes:  Denies blurring, discharge, double vision, eye irritation, eye pain, halos, itching, light sensitivity, red eye, vision loss-1 eye, and vision loss-both eyes. ENT:  Denies decreased hearing, difficulty swallowing, ear discharge, earache, hoarseness, nasal  congestion, nosebleeds, postnasal drainage, ringing in ears, sinus pressure, and sore throat. CV:  Denies bluish discoloration of lips or nails, chest pain or discomfort, difficulty breathing at night, difficulty breathing while lying down, fainting, fatigue, leg cramps with exertion, lightheadness, near fainting, palpitations, shortness of breath with exertion, swelling of feet, swelling of hands, and weight gain. Resp:  Denies chest discomfort, chest pain with inspiration, cough, coughing up blood, excessive snoring, hypersomnolence, morning headaches, pleuritic, shortness of breath, sputum productive, and wheezing. GI:  Denies abdominal pain, bloody stools, change in bowel habits, constipation, dark tarry stools, diarrhea, excessive appetite, gas, hemorrhoids, indigestion, loss of appetite, and nausea. GU:  Denies decreased libido, discharge, dysuria, erectile dysfunction, genital sores, hematuria, incontinence, nocturia, urinary frequency, and urinary hesitancy. MS:  Complains of joint pain; denies joint redness, joint swelling, loss of strength, low back pain, mid back pain, muscle aches, muscle , cramps, muscle weakness, stiffness, and thoracic pain. Derm:  Denies changes in color of skin, changes in nail beds, dryness, excessive perspiration, flushing, hair loss, insect bite(s), itching, lesion(s), poor wound healing, and rash. Neuro:  Denies brief paralysis, difficulty with concentration, disturbances in coordination, falling down, headaches, inability to speak, memory loss, numbness, poor balance, seizures, sensation of room spinning, tingling, tremors, visual disturbances, and weakness. Psych:  Denies alternate hallucination ( auditory/visual), anxiety, depression, easily angered, easily tearful, irritability, mental problems, panic attacks, sense of great danger, suicidal thoughts/plans, thoughts of violence, unusual visions or sounds, and thoughts /plans of harming others. Endo:  Denies cold  intolerance, excessive hunger, excessive thirst, excessive urination, heat intolerance, polyuria, and weight change. Heme:  Denies abnormal bruising, bleeding, enlarge lymph nodes, fevers, pallor, and skin discoloration. Allergy:  Denies hives or rash, itching eyes, persistent infections, seasonal allergies, and sneezing.  Physical Exam  General:  Well-developed,well-nourished,in no acute distress; alert,appropriate and cooperative throughout examination Head:  Normocephalic and atraumatic without obvious abnormalities. No apparent alopecia or balding. Eyes:  vision grossly intact, pupils equal, pupils round, pupils reactive to light, and no injection.  vision grossly intact, pupils equal, pupils round, pupils reactive to light, and no injection.   Ears:  External ear exam shows no significant lesions or deformities.  Otoscopic examination reveals clear canals, tympanic membranes are intact bilaterally without bulging, retraction, inflammation or discharge. Hearing is grossly normal bilaterally. Nose:  External nasal examination shows no deformity or inflammation. Nasal mucosa  are pink and moist without lesions or exudates. Mouth:  Oral mucosa and oropharynx without lesions or exudates.  Teeth in good repair. Neck:  No deformities, masses, or tenderness noted.no carotid bruits.  no carotid bruits.   Chest Wall:  No deformities, masses, tenderness or gynecomastia noted. Lungs:  Normal respiratory effort, chest expands symmetrically. Lungs are clear to auscultation, no crackles or wheezes. Heart:  Normal rate and regular rhythm. S1 and S2 normal without gallop, murmur, click, rub or other extra sounds. Abdomen:  Bowel sounds positive,abdomen soft and non-tender without masses, organomegaly or hernias noted. Rectal:  No external abnormalities noted. Normal sphincter tone. No rectal masses or tenderness.  heme negative brown stool Genitalia:  Testes bilaterally descended without nodularity, tenderness  or masses. No scrotal masses or lesions. No penis lesions or urethral discharge. Prostate:  Prostate gland firm and smooth, no enlargement, nodularity, tenderness, mass, asymmetry or induration. Msk:  normal ROM, no joint tenderness, no joint swelling, no joint warmth, no redness over joints, no joint deformities, no joint instability, and no crepitation.  normal ROM, no joint tenderness, no joint swelling, no joint warmth, no redness over joints, no joint deformities, no joint instability, and no crepitation.   Pulses:  R and L carotid,radial,femoral,dorsalis pedis and posterior tibial pulses are full and equal bilaterally Extremities:  No clubbing, cyanosis, edema, or deformity noted with normal full range of motion of all joints.   Neurologic:  No cranial nerve deficits noted. Station and gait are normal. Plantar reflexes are down-going bilaterally. DTRs are symmetrical throughout. Sensory, motor and coordinative functions appear intact. Skin:  Intact without suspicious lesions or rashes Cervical Nodes:  No lymphadenopathy noted Psych:  Cognition and judgment appear intact. Alert and cooperative with normal attention span and concentration. No apparent delusions, illusions, hallucinations   Impression & Recommendations:  Problem # 1:  PREVENTIVE HEALTH CARE (ICD-V70.0) ghm utd Orders: Venipuncture (52841) TLB-Lipid Panel (80061-LIPID) TLB-BMP (Basic Metabolic Panel-BMET) (80048-METABOL) TLB-CBC Platelet - w/Differential (85025-CBCD) TLB-Hepatic/Liver Function Pnl (80076-HEPATIC) TLB-TSH (Thyroid Stimulating Hormone) (32440-NUU) Gastroenterology Referral (GI) EKG w/ Interpretation (93000)  Reviewed preventive care protocols, scheduled due services, and updated immunizations.  Problem # 2:  FREQUENCY, URINARY (ICD-788.41)  Orders: TLB-PSA (Prostate Specific Antigen) (84153-PSA)  Problem # 3:  HYPERTENSION (ICD-401.9)  His updated medication list for this problem includes:     Lisinopril 20 Mg Tabs (Lisinopril) .Marland Kitchen... 1 by mouth once daily  Orders: Venipuncture (72536) TLB-Lipid Panel (80061-LIPID) TLB-BMP (Basic Metabolic Panel-BMET) (80048-METABOL) TLB-CBC Platelet - w/Differential (85025-CBCD) TLB-Hepatic/Liver Function Pnl (80076-HEPATIC) TLB-TSH (Thyroid Stimulating Hormone) (84443-TSH) EKG w/ Interpretation (93000)  His updated medication list for this problem includes:    Lisinopril 10 Mg Tabs (Lisinopril) .Marland Kitchen... 1 by mouth once daily  BP today: 142/90 Prior BP: 122/86 (08/06/2009)  Prior 10 Yr Risk Heart Disease: Not enough information (11/18/2006)  Labs Reviewed: K+: 4.5 (07/09/2009) Creat: : 0.9 (07/09/2009)   Chol: 157 (07/09/2009)   HDL: 55.20 (07/09/2009)   LDL: 83 (07/09/2009)   TG: 94.0 (07/09/2009)  Problem # 4:  DIASTOLIC DYSFUNCTION (ICD-429.9)  Orders: EKG w/ Interpretation (93000)  Problem # 5:  HYPERLIPIDEMIA (ICD-272.4)  His updated medication list for this problem includes:    Lovaza 1 Gm Caps (Omega-3-acid ethyl esters) .Marland Kitchen... 2 by mouth two times a day    Crestor 20 Mg Tabs (Rosuvastatin calcium) .Marland Kitchen... 1 by mouth at bedtime.  Orders: Venipuncture (64403) TLB-Lipid Panel (80061-LIPID) TLB-BMP (Basic Metabolic Panel-BMET) (80048-METABOL) TLB-CBC Platelet - w/Differential (85025-CBCD) TLB-Hepatic/Liver Function Pnl (80076-HEPATIC) TLB-TSH (  Thyroid Stimulating Hormone) (84443-TSH) EKG w/ Interpretation (93000)  His updated medication list for this problem includes:    Lovaza 1 Gm Caps (Omega-3-acid ethyl esters) .Marland Kitchen... 2 by mouth two times a day    Crestor 20 Mg Tabs (Rosuvastatin calcium) .Marland Kitchen... 1 by mouth at bedtime.  Labs Reviewed: SGOT: 26 (07/09/2009)   SGPT: 24 (07/09/2009)  Lipid Goals: Chol Goal: 200 (11/18/2006)   HDL Goal: 40 (11/18/2006)   LDL Goal: 160 (11/18/2006)   TG Goal: 150 (11/18/2006)  Prior 10 Yr Risk Heart Disease: Not enough information (11/18/2006)   HDL:55.20 (07/09/2009), 61.60 (03/18/2009)   LDL:83 (07/09/2009), 66 (03/18/2009)  Chol:157 (07/09/2009), 150 (03/18/2009)  Trig:94.0 (07/09/2009), 110.0 (03/18/2009)  Problem # 6:  CEREBROVASCULAR ACCIDENT, HX OF (ICD-V12.50)  Orders: EKG w/ Interpretation (93000)  Complete Medication List: 1)  Zoloft 100 Mg Tabs (Sertraline hcl) .... Take one tablet daily 2)  Lovaza 1 Gm Caps (Omega-3-acid ethyl esters) .... 2 by mouth two times a day 3)  Glucosamine-chondroitin 1500-1200 Mg/67ml Liqd (Glucosamine-chondroitin) 4)  Ecotrin 325 Mg Tbec (Aspirin) .Marland Kitchen.. 1 by mouth once daily 5)  Sam-e 200 Mg Tabs (S-adenosylmethionine) 6)  Multivitamins Tabs (Multiple vitamin) .Marland Kitchen.. 1 by mouth once daily 7)  Crestor 20 Mg Tabs (Rosuvastatin calcium) .Marland Kitchen.. 1 by mouth at bedtime. 8)  Lisinopril 20 Mg Tabs (Lisinopril) .Marland Kitchen.. 1 by mouth once daily 9)  Fosamax 70 Mg Tabs (Alendronate sodium) .Marland Kitchen.. 1 by mouth every week 10)  Actos 45 Mg Tabs (Pioglitazone hcl) .Marland Kitchen.. 1 by mouth daily  Other Orders: Tdap => 30yrs IM (40102) Admin 1st Vaccine (72536) Pneumococcal Vaccine (64403) Admin of Any Addtl Vaccine (47425)  Patient Instructions: 1)  Please schedule a follow-up appointment in 3 months .  Prescriptions: LISINOPRIL 20 MG TABS (LISINOPRIL) 1 by mouth once daily  #30 x 2   Entered and Authorized by:   Loreen Freud DO   Signed by:   Loreen Freud DO on 09/05/2009   Method used:   Electronically to        [id number] (retail)       [address]       [place], [place]  [address]       Ph: [phone]       Fax: [phone]   RxID:   [id number]    EKG  Procedure date:  09/05/2009  Findings:      Normal sinus rhythm with rate of:  68 bpm ,   Left axis deviation.     Flu Vaccine Next Due:  Refused     Orders Added: 1)  Venipuncture [36415] 2)  TLB-Lipid Panel [80061-LIPID] 3)  TLB-BMP (Basic Metabolic Panel-BMET) [80048-METABOL] 4)  TLB-CBC Platelet - w/Differential [85025-CBCD] 5)   TLB-Hepatic/Liver Function Pnl [80076-HEPATIC] 6)  TLB-TSH (Thyroid Stimulating Hormone) [84443-TSH] 7)  Gastroenterology Referral [GI] 8)  TLB-PSA (Prostate Specific Antigen) [84153-PSA] 9)  Tdap => 81yrs IM [90715] 10)  Admin 1st Vaccine [90471] 11)  Pneumococcal Vaccine [90732] 12)  Admin of Any Addtl Vaccine [90472] 13)  Est. Patient 65& > [99397] 14)  EKG w/ Interpretation [93000]    Immunizations Administered:  Tetanus Vaccine:    Vaccine Type: Tdap    Site: right deltoid    Mfr: Merck    Dose: 0.5 ml    Route: IM    Given by: Almeta Monas CMA (AAMA)    Exp. Date: 09/26/2011    Lot #: [id number]  VIS given: 11/23/07 version given September 05, 2009.  Pneumonia Vaccine:    Vaccine Type: Pneumovax    Site: left deltoid    Mfr: Merck    Dose: 0.5 ml    Route: IM    Given by: Almeta Monas CMA (AAMA)    Exp. Date: 03/20/2011    Lot #: [id number]    VIS given: 08/03/95 version given September 05, 2009.   Appended Document: cpx - lab/cbs  Laboratory Results   Urine Tests   Date/Time Reported: September 05, 2009 10:28 AM   Routine Urinalysis   Color: yellow Appearance: Clear Glucose: negative   (Normal Range: Negative) Bilirubin: negative   (Normal Range: Negative) Ketone: negative   (Normal Range: Negative) Spec. Gravity: 1.020   (Normal Range: 1.003-1.035) Blood: negative   (Normal Range: Negative) pH: 5.0   (Normal Range: 5.0-8.0) Protein: negative   (Normal Range: Negative) Urobilinogen: negative   (Normal Range: 0-1) Nitrite: negative   (Normal Range: Negative) Leukocyte Esterace: negative   (Normal Range: Negative)    Comments: Floydene Flock  September 05, 2009 10:28 AM      Appended Document: Orders Update    Clinical Lists Changes  Problems: Added new problem of PREOPERATIVE EXAMINATION (ICD-V72.84) Orders: Added new Service order of Venipuncture [phone]) - Signed Added new Test order of T-Vitamin D (25-Hydroxy) (84132-44010) -  Signed Added new Test order of T-2 View CXR (71020TC) - Signed

## 2010-02-04 NOTE — Progress Notes (Signed)
Summary: CALLING ABOUT PROCDURE--TEE  Phone Note Call from Patient Call back at [phone]   Summary of Call: PT REQUEST CALL ABOUT A PROCDEURE. Initial call taken by: Judie Grieve,  March 22, 2009 10:37 AM  Follow-up for Phone Call        discussed with Dr Almon Hercules recently saw Dr Pearlean Brownie 03-18-09 and a TTE was ordered and not a TEE--I am trying to get records from Dr Pearlean Brownie and  have left a message at his office--I have talked with pt and he knows I am working on this and will follow-up with him after Dr Shirlee Latch has had an opportunity to review the records Katina Dung, RN, BSN  March 22, 2009 2:44 PM  received note from Dr Pearlean Brownie from 03-18-09--note reviewed by Dr Shirlee Latch and TEE is recommended-I discussed scheduling TEE with pt-pt wants to complete monitor(around the middle of April per pt) and think about scheduling TEE-I left it with pt that he would call me if he decided he wanted to proceed with TEE--Dr Shirlee Latch aware pf pt's decision--I will cancel TTE already scheduled for April 11(pt had TTE echo 12/26/08 at [facility])

## 2010-02-04 NOTE — Letter (Signed)
Summary: Primary Care Appointment Letter  [facility] [place]  [address] [place], [place] [address]   Phone: [phone]  Fax: [phone]    01/09/2009 MRN: [id number]  Antonio Stephens [address] [place], [place]  [address]  Dear Mr. Roskelley,   Your Primary Care Physician Loreen Freud DO has indicated that:    ___X____it is time to schedule an appointment. (To follow up on your hospital visit.)    _______you missed your appointment on______ and need to call and          reschedule.    _______you need to have lab work done.    _______you need to schedule an appointment discuss lab or test results.    _______you need to call to reschedule your appointment that is                       scheduled on _________.     Please call our office as soon as possible. Our phone number is [phone]. Please press option 1. Our office is open 8a-12noon and 1p-5p, Monday through Friday.     Thank you,    [facility] Scheduler

## 2010-02-04 NOTE — Letter (Signed)
Summary: Generic Letter  [facility] [place]  [address] [place], [place] [address]   Phone: [phone]  Fax: [phone]    07/26/2009  re: Antonio Stephens DOB [birth year] [address] [place], [place]  [address]  Dear Dr Earlene Plater,  The above patient is medically cleared for surgery. I have discussed this with his cardiology office as well and since Antonio Stephens has had no new symptoms / chest pain etc they agree he can have surgery.  If you need to discuss this further feel free to call us [phone].  His cardiologist is Dr Elly Modena  [phone].           Sincerely,   Loreen Freud DO

## 2010-02-04 NOTE — Progress Notes (Signed)
Summary: Pt needs vit-d and chest xray (lmom 9/6, 9/7)  Phone Note Outgoing Call   Call placed by: Almeta Monas CMA Duncan Dull),  September 10, 2009 12:41 PM Details for Reason: Pt needs addtl labs and Chest xray for Surgery Summary of Call: Recieved letter from Dr. Michaell Cowing requesting clearance for surgery, current labs, EKG and Chest X-ray. Left message to call back  pt needs a chest x-ray and Vit-d levels drawn. Almeta Monas CMA Duncan Dull)  September 10, 2009 12:45 PM  [email or web address] Barban  September 11, 2009 9:26 AM  Spk with pt and he declined x-ray, said he had one done in July at [facility]. Scheduled appt for lab visit in the morning for Vit-d check. Adv pt that I have the order for his x-ray so If the one done at [facility] was unacceptable then we will send him for another. Pt voiced understanding. X-ray report printed. Please advise Almeta Monas CMA Duncan Dull)  September 12, 2009 8:29 AM    Follow-up for Phone Call        as long as surgeon is ok with everything he has that is fine.  Follow-up by: Loreen Freud DO,  September 12, 2009 9:52 AM

## 2010-02-04 NOTE — Letter (Signed)
Summary: TEE Instructions  [facility], Main Office  [address]   [place], [place] [address]   Phone: [phone]  Fax: [phone]      TEE Instructions    You are scheduled for a TEE on  Wednesday June1 ,2011 with Dr. Shirlee Latch.  Please arrive at the [facility] at 9:30 a.m.  on the day of your procedure.  1)   Diet:     A)  Nothing to eat or drink after midnight except your medications with a sip of water.    2)  Must have a responsible person to drive you home.  3)   Bring your current insurance cards and current list of all your medications.   *Special Note:  Every effort is made to have your procedure done on time.  Occasionally there are emergencies that present themselves at the hospital that may cause delays.  Please be patient if a delay does occur.  *If you have any questions after you get home, please call the office at [phone].

## 2010-02-04 NOTE — Progress Notes (Signed)
Summary: TEE  Phone Note From Other Clinic   Caller: Nurse Summary of Call: Per Andrey Campanile from [facility] neuro. Pt wants to schedule TEE. Please call office with info [phone] Initial call taken by: Edman Circle,  May 21, 2009 3:45 PM  Follow-up for Phone Call        [facility] [place] [facility] Katina Dung, RN, BSN  May 21, 2009 3:52 PM  [facility] pt--pt had declined to schedule TEE in the past--see notes in EMR  from 03-22-09 Katina Dung, RN, BSN  May 21, 2009 4:10 PM  [facility] Katina Dung, RN, BSN  May 22, 2009 12:38 PM  talked with Andrey Campanile at [facility] by telephone--she states pt is willing to schedule TEE at this time-I will continue to try to reach by to get this scheduled Katina Dung, RN, BSN  May 22, 2009 4:55 PM  Katina Dung, RN, BSN  May 22, 2009 4:55 PM   Additional Follow-up for Phone Call Additional follow up Details #1::        LMTCB pt--Anne Sharren Bridge, RN, BSN  May 23, 2009 9:39 AM  TEE scheduled for 06-05-09 at 10:30am--LMTCB for pt--I talked with pt by telephone--he is aware TEE scheduled for 06-05-09--I have mailed instructions to pt talked with Andrey Campanile at [facility] -she is aware TEE scheduled for 06-05-09

## 2010-02-04 NOTE — Letter (Signed)
Summary: [facility]   Imported By: Lanelle Bal 10/17/2009 13:44:59  _____________________________________________________________________  External Attachment:    Type:   Image     Comment:   External Document

## 2010-02-04 NOTE — Letter (Signed)
Summary: Letter Regarding Insulin Resistance Intervention After Stroke Tr  Letter Regarding Insulin Resistance Intervention After Stroke Trial/[facility]   Imported By: Lanelle Bal 07/11/2009 11:19:24  _____________________________________________________________________  External Attachment:    Type:   Image     Comment:   External Document

## 2010-02-04 NOTE — Assessment & Plan Note (Signed)
Summary: np6/diastolic dysfunction/jml   Primary Provider:  Loreen Freud DO  CC:  new patient/diastolic dysfunction/ .  History of Present Illness: 65 yo with recent stroke presents for cardiology evaluation.  Patient developed a flu-like illness in 12/10 with severe coughing.  During this illness, he developed right-sided weakness and was admitted to the hospital. 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  He underwent rehab and has been doing quite well with minimal residual limitation.  While in the hospital, he had an echo showing EF 50-55% with mild diastolic dysfunction.  He has questions about diastolic dysfunction.   Currently, patient is doing very well.  He is walking for exercise with no exertional chest pain and no shortness of breath.   ECG: NSR, normal  Labs (12/10): creatinine 0.97, LDL 116, HDL 39  Current Medications (verified): 1)  Zoloft 100 Mg  Tabs (Sertraline Hcl) .... Take One Tablet Daily 2)  Lovaza 1 Gm Caps (Omega-3-Acid Ethyl Esters) .... 2 By Mouth Two Times A Day 3)  Glucosamine-Chondroitin 1500-1200 Mg/69ml  Liqd (Glucosamine-Chondroitin) 4)  Ecotrin 325 Mg Tbec (Aspirin) .Marland Kitchen.. 1 By Mouth Once Daily 5)  Sam-E 200 Mg Tabs (S-Adenosylmethionine) 6)  Multivitamins  Tabs (Multiple Vitamin) .Marland Kitchen.. 1 By Mouth Once Daily 7)  Crestor 20 Mg Tabs (Rosuvastatin Calcium) .Marland Kitchen.. 1 By Mouth At Bedtime.  Allergies (verified): No Known Drug Allergies  Past History:  Past Medical History: 1. HYPERLIPIDEMIA (ICD-272.4) 2. 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  Carotid dopplers showed no evidence of stenosis.   3.  DEPRESSION, HX OF (ICD-V11.8) 4. BACK PAIN (ICD-724.5) 5. History of right eye retinal detachment.   6. Echo (12/10): Technically difficult study.  EF 50-55%.  Grade I diastolic dysfucntion.   Family History: Reviewed history from 02/05/2009 and no changes required. Family History of Stroke M 1st  degree relative--  38s--- I[facility] Family History High cholesterol  Social History: Married, lives at [place].  Quit tobacco 1969                                                                                Alcohol use-no Drug use-no Regular exercise-yes Occupation: Systems analyst  Review of Systems       All systems reviewed and negative except as per HPI.   Vital Signs:  Patient profile:   65 year old male Height:      73.5 inches Weight:      188 pounds Pulse rate:   83 / minute Pulse rhythm:   regular BP sitting:   138 / 90  (left arm) Cuff size:   large  Vitals Entered By: Judithe Modest CMA (February 06, 2009 3:05 PM)  Physical Exam  General:  Well developed, well nourished, in no acute distress. Head:  normocephalic and atraumatic Nose:  no deformity, discharge, inflammation, or lesions Mouth:  Teeth, gums and palate normal. Oral mucosa normal. Neck:  Neck supple, no JVD. No masses, thyromegaly or abnormal cervical nodes. Lungs:  Clear bilaterally to auscultation and percussion. Heart:  Non-displaced PMI, chest non-tender; regular rate and rhythm, S1, S2 without murmurs, rubs or gallops. Carotid upstroke normal, no bruit.  pulses,  no bruits. Pedals normal pulses. No edema, no varicosities. Abdomen:  Bowel sounds positive; abdomen soft and non-tender without masses, organomegaly, or hernias noted. No hepatosplenomegaly. Msk:  Back normal, normal gait. Muscle strength and tone normal. Extremities:  No clubbing or cyanosis. Neurologic:  Alert and oriented x 3. Skin:  Intact without lesions or rashes. Psych:  Normal affect.   Impression & Recommendations:  Problem # 1:  DIASTOLIC DYSFUNCTION (ICD-429.9) The patient has questions about diastolic dysfunction.  He has only grade I diastolic dysfunction (delayed relaxation).  This is very common in his age group and should not cause any exertional symptoms. EF is 50-55% with global low normal systolic function.  I am  unsure of the significance of the borderline systolic function but it is also unlikely to cause any uneaerl def  Problem # 2:  CEREBROVASCULAR ACCIDENT, HX OF (ICD-V12.50) Patient had a recent stroke.  Carotid dopplers and intracranial MRA showed no significant atherosclerotic disease.  Interestingly, the patient was coughing hard due to acute bronchitis at the time of the event.  It is possible that coughing could have pushed open a PFO, allowing potential paradoxical embolism/right to left shunt.  I think that it will be a good idea to do a TEE to look for PFO and also to get a better view of the heart.  He and his wife will think about this.  They may want to see Dr. Pearlean Brownie before doing the study.   Problem # 3:  HYPERLIPIDEMIA (ICD-272.4) Presumed vascular disease given CVA.  Would treat lipids aggressively.   Other Orders: EKG w/ Interpretation (93000)  Patient Instructions: 1)  Call and let me know your decision about the TEE --Luana Shu [phone]

## 2010-02-04 NOTE — Letter (Signed)
Summary: [facility]  [facility]   Imported By: Lanelle Bal 03/25/2009 08:53:45  _____________________________________________________________________  External Attachment:    Type:   Image     Comment:   External Document

## 2010-02-04 NOTE — Letter (Signed)
Summary: Surgical Clearance/[facility]  Surgical Clearance/[facility]   Imported By: Lanelle Bal 08/22/2009 09:34:53  _____________________________________________________________________  External Attachment:    Type:   Image     Comment:   External Document

## 2010-02-04 NOTE — Letter (Signed)
Summary: Physician's Orders  [facility]ician's Orders   Imported By: Debby Freiberg 06/28/2009 16:30:18  _____________________________________________________________________  External Attachment:    Type:   Image     Comment:   External Document

## 2010-02-06 NOTE — Letter (Signed)
Summary: [facility]   Imported By: Lanelle Bal 12/26/2009 11:45:29  _____________________________________________________________________  External Attachment:    Type:   Image     Comment:   External Document

## 2010-02-06 NOTE — Progress Notes (Signed)
----   Converted from flag ---- ---- 12/10/2009 9:47 AM, Doristine Devoid CMA wrote: fax labs to [facility] fax:[phone] ------------------------------  done

## 2010-02-06 NOTE — Medication Information (Signed)
Summary: Noncompliance with Lovaza/[employer]  Noncompliance with Lovaza/[employer]   Imported By: Lanelle Bal 12/27/2009 12:44:31  _____________________________________________________________________  External Attachment:    Type:   Image     Comment:   External Document

## 2010-02-06 NOTE — Assessment & Plan Note (Signed)
Summary: discuss med//fd   Vital Signs:  Patient profile:   65 year old male Weight:      197 pounds BMI:     25.73 Pulse rate:   100 / minute Pulse rhythm:   regular BP sitting:   120 / 70  (left arm) Cuff size:   regular  Vitals Entered By: Almeta Monas CMA Duncan Dull) (December 24, 2009 2:07 PM) CC: here to discuss meds   History of Present Illness: Pt here to review labs.  Current Medications (verified): 1)  Zoloft 100 Mg  Tabs (Sertraline Hcl) .... Take One Tablet Daily 2)  Lovaza 1 Gm Caps (Omega-3-Acid Ethyl Esters) .... 2 By Mouth Two Times A Day 3)  Glucosamine-Chondroitin 1500-1200 Mg/53ml  Liqd (Glucosamine-Chondroitin) 4)  Ecotrin 325 Mg Tbec (Aspirin) .Marland Kitchen.. 1 By Mouth Once Daily 5)  Sam-E 200 Mg Tabs (S-Adenosylmethionine) 6)  Multivitamins  Tabs (Multiple Vitamin) .Marland Kitchen.. 1 By Mouth Once Daily 7)  Crestor 20 Mg Tabs (Rosuvastatin Calcium) .Marland Kitchen.. 1 By Mouth At Bedtime. 8)  Lisinopril 20 Mg Tabs (Lisinopril) .Marland Kitchen.. 1 By Mouth Once Daily 9)  Fosamax 70 Mg Tabs (Alendronate Sodium) .Marland Kitchen.. 1 By Mouth Every Week 10)  Actos 45 Mg Tabs (Pioglitazone Hcl) .Marland Kitchen.. 1 By Mouth Daily 11)  Calcium Antacid Ultra Max St 1000 Mg Chew (Calcium Carbonate Antacid) .Marland Kitchen.. 1 By Mouth Two Times A Day 12)  Iron 28 Mg Tabs (Ferrous Sulfate) .... 3 By Mouth Once Daily 13)  Protonix 40 Mg Tbec (Pantoprazole Sodium) .Marland Kitchen.. 1 By Mouth Once Daily 14)  Xarelto 10 Mg Tabs (Rivaroxaban) .Marland Kitchen.. 1 By Mouth Once Daily 15)  Celebrex 200 Mg Caps (Celecoxib) .Marland Kitchen.. 1 By Mouth Once Daily 16)  Nucynta Er 50 Mg Xr12h-Tab (Tapentadol Hcl) .Marland Kitchen.. 1 By Mouth Q12 H  Allergies (verified): No Known Drug Allergies  Past History:  Past medical, surgical, family and social histories (including risk factors) reviewed for relevance to current acute and chronic problems.  Past Medical History: Reviewed history from 08/06/2009 and no changes required. 1. HYPERLIPIDEMIA (ICD-272.4) 2. CEREBROVASCULAR ACCIDENT, HX OF (ICD-V12.50): Right  lateral medullary stroke (12/10).  Carotid dopplers showed no evidence of stenosis.   3.  DEPRESSION, HX OF (ICD-V11.8) 4. BACK PAIN (ICD-724.5) 5. History of right eye retinal detachment.   6. Echo (12/10): Technically difficult study.  EF 50-55%.  Grade I diastolic dysfucntion.  Hypertension  Past Surgical History:  Right knee arthroscopy. 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 Family History of Stroke M 1st degree relative--  36s--- I[facility] Family History High cholesterol  Social History: Reviewed history from 02/06/2009 and no changes required. Married, lives at [place].  Quit tobacco 1969                                                                                Alcohol use-no Drug use-no Regular exercise-yes Occupation: Systems analyst  Review of Systems      See HPI  Physical Exam  General:  Well-developed,well-nourished,in no acute distress; alert,appropriate and cooperative throughout examination Psych:  Oriented X3 and good eye contact.  Impression & Recommendations:  Problem # 1:  HYPERTENSION (ICD-401.9)  His updated medication list for this problem includes:    Lisinopril 20 Mg Tabs (Lisinopril) .Marland Kitchen... 1 by mouth once daily  BP today: 120/70 Prior BP: 142/90 (09/05/2009)  Prior 10 Yr Risk Heart Disease: Not enough information (11/18/2006)  Labs Reviewed: K+: 4.3 (12/10/2009) Creat: : 0.9 (12/10/2009)   Chol: 156 (12/10/2009)   HDL: 51.60 (12/10/2009)   LDL: 89 (12/10/2009)   TG: 76.0 (12/10/2009)  Problem # 2:  HYPERLIPIDEMIA (ICD-272.4)  His updated medication list for this problem includes:    Lovaza 1 Gm Caps (Omega-3-acid ethyl esters) .Marland Kitchen... 2 by mouth two times a day    Crestor 20 Mg Tabs (Rosuvastatin calcium) .Marland Kitchen... 1 by mouth at bedtime.  Labs Reviewed: SGOT: 27 (12/10/2009)   SGPT: 19 (12/10/2009)  Lipid  Goals: Chol Goal: 200 (11/18/2006)   HDL Goal: 40 (11/18/2006)   LDL Goal: 160 (11/18/2006)   TG Goal: 150 (11/18/2006)  Prior 10 Yr Risk Heart Disease: Not enough information (11/18/2006)   HDL:51.60 (12/10/2009), 50.40 (09/05/2009)  LDL:89 (12/10/2009), 73 (09/05/2009)  Chol:156 (12/10/2009), 135 (09/05/2009)  Trig:76.0 (12/10/2009), 59.0 (09/05/2009)  Problem # 3:  CEREBROVASCULAR ACCIDENT, HX OF (ICD-V12.50) pt is in a study with actos with Dr Pearlean Brownie  Complete Medication List: 1)  Zoloft 100 Mg Tabs (Sertraline hcl) .... Take one tablet daily 2)  Lovaza 1 Gm Caps (Omega-3-acid ethyl esters) .... 2 by mouth two times a day 3)  Glucosamine-chondroitin 1500-1200 Mg/30ml Liqd (Glucosamine-chondroitin) 4)  Ecotrin 325 Mg Tbec (Aspirin) .Marland Kitchen.. 1 by mouth once daily 5)  Sam-e 200 Mg Tabs (S-adenosylmethionine) 6)  Multivitamins Tabs (Multiple vitamin) .Marland Kitchen.. 1 by mouth once daily 7)  Crestor 20 Mg Tabs (Rosuvastatin calcium) .Marland Kitchen.. 1 by mouth at bedtime. 8)  Lisinopril 20 Mg Tabs (Lisinopril) .Marland Kitchen.. 1 by mouth once daily 9)  Fosamax 70 Mg Tabs (Alendronate sodium) .Marland Kitchen.. 1 by mouth every week 10)  Actos 45 Mg Tabs (Pioglitazone hcl) .Marland Kitchen.. 1 by mouth daily 11)  Calcium Antacid Ultra Max St 1000 Mg Chew (Calcium carbonate antacid) .Marland Kitchen.. 1 by mouth two times a day 12)  Iron 28 Mg Tabs (Ferrous sulfate) .... 3 by mouth once daily 13)  Protonix 40 Mg Tbec (Pantoprazole sodium) .Marland Kitchen.. 1 by mouth once daily 14)  Xarelto 10 Mg Tabs (Rivaroxaban) .Marland Kitchen.. 1 by mouth once daily 15)  Celebrex 200 Mg Caps (Celecoxib) .Marland Kitchen.. 1 by mouth once daily 16)  Nucynta Er 50 Mg Xr12h-tab (Tapentadol hcl) .Marland Kitchen.. 1 by mouth q12 h Prescriptions: PROTONIX 40 MG TBEC (PANTOPRAZOLE SODIUM) 1 by mouth once daily  #30 x 0   Entered and Authorized by:   Loreen Freud DO   Signed by:   Loreen Freud DO on 12/24/2009   Method used:   Historical   RxID:   [id number]    Orders Added: 1)  Est. Patient Level III [28413]

## 2010-02-20 NOTE — Letter (Signed)
Summary: [facility]   Imported By: Sherian Rein 02/11/2010 11:15:01  _____________________________________________________________________  External Attachment:    Type:   Image     Comment:   External Document

## 2010-03-22 LAB — URINE CULTURE
Colony Count: NO GROWTH
Culture: NO GROWTH

## 2010-03-22 LAB — POCT I-STAT, CHEM 8
BUN: 16 mg/dL (ref 6–23)
Calcium, Ion: 1.14 mmol/L (ref 1.12–1.32)
Creatinine, Ser: 0.9 mg/dL (ref 0.4–1.5)
Sodium: 139 mEq/L (ref 135–145)
TCO2: 28 mmol/L (ref 0–100)

## 2010-03-22 LAB — CBC
HCT: 44.2 % (ref 39.0–52.0)
Hemoglobin: 15 g/dL (ref 13.0–17.0)
MCH: 31.7 pg (ref 26.0–34.0)
MCHC: 34 g/dL (ref 30.0–36.0)
MCV: 93.2 fL (ref 78.0–100.0)
Platelets: 191 10*3/uL (ref 150–400)
RBC: 4.75 MIL/uL (ref 4.22–5.81)
RDW: 13.7 % (ref 11.5–15.5)
WBC: 7.6 10*3/uL (ref 4.0–10.5)

## 2010-03-22 LAB — DIFFERENTIAL
Basophils Absolute: 0 10*3/uL (ref 0.0–0.1)
Basophils Relative: 0 % (ref 0–1)
Eosinophils Absolute: 0.1 10*3/uL (ref 0.0–0.7)
Eosinophils Relative: 1 % (ref 0–5)
Lymphocytes Relative: 26 % (ref 12–46)
Lymphs Abs: 2 10*3/uL (ref 0.7–4.0)
Monocytes Absolute: 0.6 10*3/uL (ref 0.1–1.0)
Monocytes Relative: 8 % (ref 3–12)
Neutro Abs: 4.9 10*3/uL (ref 1.7–7.7)
Neutrophils Relative %: 64 % (ref 43–77)

## 2010-03-22 LAB — POCT CARDIAC MARKERS
Myoglobin, poc: 51.3 ng/mL (ref 12–200)
Troponin i, poc: <0.05 ng/mL (ref 0.00–0.09)

## 2010-03-22 LAB — URINALYSIS, ROUTINE W REFLEX MICROSCOPIC
Bilirubin Urine: NEGATIVE
Glucose, UA: NEGATIVE mg/dL
Hgb urine dipstick: NEGATIVE
Ketones, ur: NEGATIVE mg/dL
Nitrite: NEGATIVE
Protein, ur: NEGATIVE mg/dL
Specific Gravity, Urine: 1.012 (ref 1.005–1.030)
Urobilinogen, UA: 0.2 mg/dL (ref 0.0–1.0)
pH: 7 (ref 5.0–8.0)

## 2010-04-07 LAB — APTT: aPTT: 28 seconds (ref 24–37)

## 2010-04-07 LAB — CK TOTAL AND CKMB (NOT AT ARMC)
CK, MB: 1.5 ng/mL (ref 0.3–4.0)
Relative Index: INVALID (ref 0.0–2.5)
Total CK: 46 U/L (ref 7–232)

## 2010-04-07 LAB — COMPREHENSIVE METABOLIC PANEL
ALT: 24 U/L (ref 0–53)
ALT: 31 U/L (ref 0–53)
AST: 21 U/L (ref 0–37)
AST: 21 U/L (ref 0–37)
Albumin: 3.3 g/dL — ABNORMAL LOW (ref 3.5–5.2)
Alkaline Phosphatase: 67 U/L (ref 39–117)
Alkaline Phosphatase: 74 U/L (ref 39–117)
BUN: 18 mg/dL (ref 6–23)
CO2: 25 mEq/L (ref 19–32)
CO2: 31 mEq/L (ref 19–32)
Calcium: 8.9 mg/dL (ref 8.4–10.5)
Chloride: 103 mEq/L (ref 96–112)
Chloride: 107 mEq/L (ref 96–112)
Creatinine, Ser: 0.9 mg/dL (ref 0.4–1.5)
Creatinine, Ser: 0.97 mg/dL (ref 0.4–1.5)
GFR calc Af Amer: >60 mL/min (ref >60)
GFR calc Af Amer: >60 mL/min (ref >60)
GFR calc non Af Amer: >60 mL/min (ref >60)
GFR calc non Af Amer: >60 mL/min (ref >60)
Glucose, Bld: 107 mg/dL — ABNORMAL HIGH (ref 70–99)
Potassium: 3.8 mEq/L (ref 3.5–5.1)
Potassium: 3.9 mEq/L (ref 3.5–5.1)
Sodium: 142 mEq/L (ref 135–145)
Total Bilirubin: 0.8 mg/dL (ref 0.3–1.2)
Total Bilirubin: 1 mg/dL (ref 0.3–1.2)
Total Protein: 6 g/dL (ref 6.0–8.3)

## 2010-04-07 LAB — PROTIME-INR
INR: 1 (ref 0.00–1.49)
Prothrombin Time: 13.1 seconds (ref 11.6–15.2)

## 2010-04-07 LAB — HEMOGLOBIN A1C: Mean Plasma Glucose: 114 mg/dL

## 2010-04-07 LAB — CBC
HCT: 43.5 % (ref 39.0–52.0)
HCT: 43.8 % (ref 39.0–52.0)
Hemoglobin: 14.8 g/dL (ref 13.0–17.0)
Hemoglobin: 15.1 g/dL (ref 13.0–17.0)
MCHC: 34 g/dL (ref 30.0–36.0)
MCHC: 34.6 g/dL (ref 30.0–36.0)
MCHC: 34.6 g/dL (ref 30.0–36.0)
MCV: 92.1 fL (ref 78.0–100.0)
MCV: 92.3 fL (ref 78.0–100.0)
MCV: 92.6 fL (ref 78.0–100.0)
MCV: 93.8 fL (ref 78.0–100.0)
Platelets: 236 10*3/uL (ref 150–400)
Platelets: 239 10*3/uL (ref 150–400)
Platelets: 248 10*3/uL (ref 150–400)
RBC: 4.64 MIL/uL (ref 4.22–5.81)
RBC: 4.71 MIL/uL (ref 4.22–5.81)
RBC: 4.75 MIL/uL (ref 4.22–5.81)
RBC: 4.78 MIL/uL (ref 4.22–5.81)
RDW: 12.9 % (ref 11.5–15.5)
RDW: 13 % (ref 11.5–15.5)
RDW: 13.1 % (ref 11.5–15.5)
WBC: 9.1 10*3/uL (ref 4.0–10.5)
WBC: 9.7 10*3/uL (ref 4.0–10.5)
WBC: 9.8 10*3/uL (ref 4.0–10.5)

## 2010-04-07 LAB — DIFFERENTIAL
Basophils Absolute: 0 10*3/uL (ref 0.0–0.1)
Basophils Absolute: 0 10*3/uL (ref 0.0–0.1)
Basophils Relative: 0 % (ref 0–1)
Basophils Relative: 1 % (ref 0–1)
Basophils Relative: 1 % (ref 0–1)
Eosinophils Absolute: 0 10*3/uL (ref 0.0–0.7)
Eosinophils Absolute: 0 10*3/uL (ref 0.0–0.7)
Eosinophils Absolute: 0.2 10*3/uL (ref 0.0–0.7)
Eosinophils Absolute: 0.2 10*3/uL (ref 0.0–0.7)
Eosinophils Relative: 0 % (ref 0–5)
Eosinophils Relative: 0 % (ref 0–5)
Eosinophils Relative: 2 % (ref 0–5)
Eosinophils Relative: 2 % (ref 0–5)
Lymphocytes Relative: 24 % (ref 12–46)
Lymphocytes Relative: 7 % — ABNORMAL LOW (ref 12–46)
Lymphocytes Relative: 7 % — ABNORMAL LOW (ref 12–46)
Lymphs Abs: 0.7 10*3/uL (ref 0.7–4.0)
Lymphs Abs: 2.2 10*3/uL (ref 0.7–4.0)
Lymphs Abs: 2.3 10*3/uL (ref 0.7–4.0)
Monocytes Absolute: 0.4 10*3/uL (ref 0.1–1.0)
Monocytes Absolute: 0.9 10*3/uL (ref 0.1–1.0)
Monocytes Absolute: 0.9 10*3/uL (ref 0.1–1.0)
Monocytes Relative: 1 % — ABNORMAL LOW (ref 3–12)
Monocytes Relative: 10 % (ref 3–12)
Monocytes Relative: 9 % (ref 3–12)
Neutro Abs: 5.8 10*3/uL (ref 1.7–7.7)
Neutrophils Relative %: 63 % (ref 43–77)
Neutrophils Relative %: 92 % — ABNORMAL HIGH (ref 43–77)

## 2010-04-07 LAB — BASIC METABOLIC PANEL
CO2: 34 mEq/L — ABNORMAL HIGH (ref 19–32)
Chloride: 103 mEq/L (ref 96–112)
Creatinine, Ser: 0.94 mg/dL (ref 0.4–1.5)
GFR calc Af Amer: >60 mL/min (ref >60)
Potassium: 3.7 mEq/L (ref 3.5–5.1)

## 2010-04-07 LAB — URINALYSIS, ROUTINE W REFLEX MICROSCOPIC
Bilirubin Urine: NEGATIVE
Ketones, ur: 40 mg/dL — AB
Leukocytes, UA: NEGATIVE
Nitrite: NEGATIVE
Protein, ur: 30 mg/dL — AB
Urobilinogen, UA: 1 mg/dL (ref 0.0–1.0)

## 2010-04-07 LAB — LIPID PANEL
Cholesterol: 177 mg/dL (ref 0–200)
LDL Cholesterol: 116 mg/dL — ABNORMAL HIGH (ref 0–99)
Total CHOL/HDL Ratio: 4.5 RATIO

## 2010-04-07 LAB — TROPONIN I: Troponin I: 0.02 ng/mL (ref 0.00–0.06)

## 2010-05-23 NOTE — Op Note (Signed)
NAME:  Antonio Stephens, Antonio Stephens NO.:  [id number]   MEDICAL RECORD NO.:  [id number]          PATIENT TYPE:  AMB   LOCATION:  [facility]                         FACILITY:  [facility]   PHYSICIAN:  Deidre Ala, M.D.    DATE OF BIRTH:  [birth year]   DATE OF PROCEDURE:  05/20/2006  DATE OF DISCHARGE:                               OPERATIVE REPORT   PREOPERATIVE DIAGNOSIS:  Degenerative osteoarthritis, medial right knee,  with some significant degenerative medial meniscus tear.   POSTOPERATIVE DIAGNOSES:  1. Posterior medial meniscus tear, stellate, unstable.  2. Lateral degenerative meniscal fraying.  3. Degenerative joint disease, grade 3-4, medial tibial plateau and      posterior patella.  4. Tight lateral retinaculum.  5. Medial and lateral plicas.   PROCEDURES:  1. Right knee operative arthroscopy with partial medial lateral      meniscectomies.  2. Abrasion and ablation chondroplasties.  3. Lateral retinacular release.  4. Medial and lateral plica excision.   SURGEON:  1. Charlesetta Shanks, M.D.   ASSISTANT:  Phineas Semen, P.A.   ANESTHESIA:  General with LMA.   CULTURES:  None.   DRAINS:  None.   ESTIMATED BLOOD LOSS:  Minimal.   TOURNIQUET TIME:  35 minutes.   PATHOLOGIC FINDINGS AND HISTORY:  Antonio Stephens is a 65 year old active male  who came in with knee pain with some catching and giving away.  We  treated him with cortisone injection.  He was sent by Sharlet Salina,  M.D., at [facility].  Due to persistence of symptoms, an MRI scan  was obtained showing a significant degenerative medial meniscus tear  with osteoarthritis of the medial compartment with a Baker cyst present  but not enlarged or distended.  He elected to proceed with diagnostic  and operative arthroscopy.  At surgery he had a stellate posterior horn  unstable medial meniscus tear with grade 3-4 changes on the medial  tibial plateau, posterior medial femoral condyle, and this was all  debrided  and smoothed with the ablator and shaver.  The lateral meniscus  had degenerative inner rim fraying.  ACL was intact.  He had osteophytes  off the patella with grade 2-3 changes, posterior patella, with a tight  lateral retinaculum, huge medial and lateral plicas, and superior pouch  synovitis.  All of this was debrided, abrasion and ablation  chondroplasty carried out and menisci smoothed to stable rims.   PROCEDURE:  With adequate anesthesia obtained using LMA technique, 1 g  Ancef given IV prophylaxis, the patient was placed in the supine  position.  The right lower extremity was prepped from the malleoli to  the leg holder in the standard fashion.  After standard prepping and  draping, Esmarch exsanguination was used.  The tourniquet was let up to  350 mmHg.  Superior lateral inflow portal was made.  The knee was  insufflated with normal saline with arthroscopic pump.  Medial and  lateral scope portals were then made and the joint was thoroughly  inspected.  I then shaved the medial plica back to the sidewall and  lysed the medial  band.  I then evaluated the medial meniscus and probed  it using a basket.  I saucerized the posterior horn and used a shaver to  smooth and used the ablator on 1 to smooth the rim and the medial  femoral condyle and medial tibial plateau.  I checked the ACL.  I then  reversed portals.  I shaved the inner rim lateral meniscus, smoothed  with the ablator, shaved out the lateral plica.  I then assessed tilt  and track and did an arthroscopic lateral retinacular release from  vastus lateralis to the joint line.  I then shaved out the pouch of  synovitis and smoothed the posterior patella with the ablator on 1.  The  knee was then irrigated through the scope, 0.5% Marcaine with morphine  was injected in and about the joint.  The portals were left open.  A  bulky sterile compressive dressing was applied with a lateral foam pad  for tamponade and Easy Rap placed.   The patient then having tolerated  the procedure well was awakened, taken to the recovery room in  satisfactory condition, to be discharged per outpatient routine, given  Percocet for pain and told call the office for appointment for recheck  tomorrow.           ______________________________  V. Charlesetta Shanks, M.D.     VEP/MEDQ  D:  05/20/2006  T:  05/20/2006  Job:  [id number]   cc:   Sharlet Salina, M.D.  Fax: [phone]

## 2010-05-23 NOTE — Op Note (Signed)
NAME:  Antonio Stephens, Antonio Stephens                   ACCOUNT NO.:  [id number]   MEDICAL RECORD NO.:  [id number]                   PATIENT TYPE:  AMB   LOCATION:  [facility]O                                 FACILITY:  [facility]   PHYSICIAN:  Graylin Shiver, M.D.                DATE OF BIRTH:  12/02/1945   DATE OF PROCEDURE:  05/21/2003  DATE OF DISCHARGE:                                 OPERATIVE REPORT   PROCEDURE:  Colonoscopy with biopsies.   INDICATION:  Screening.   Informed consent was obtained after explanation of the risks of bleeding,  infection, and perforation.   PREMEDICATION:  Fentanyl 100 mcg IV, Versed 8 mg IV.   DESCRIPTION OF PROCEDURE:  With the patient in the left lateral decubitus  position a rectal exam was performed and no masses were felt.  The Olympus  colonoscope was inserted into the rectum and advanced around the colon to  the cecum.  Cecal landmarks were identified.  The cecum revealed a small 4-  mm sessile polyp biopsied with cold forceps.  The ascending colon showed a  small 3-mm sessile polyp biopsied off with cold forceps.  The transverse  colon looked normal.  The descending colon, sigmoid and rectum looked  normal.  He tolerated the procedure well without complications.   IMPRESSION:  Two small colon polyps.   PLAN:  The pathology will be checked.   Diagnosis code is 211.3.                                               Graylin Shiver, M.D.    SFG/MEDQ  D:  05/21/2003  T:  05/21/2003  Job:  [id number]   cc:   Sharlet Salina, M.D.  [address]  [place]  [place] [address]  Fax: [phone]

## 2010-05-30 ENCOUNTER — Encounter: Payer: Self-pay | Admitting: Internal Medicine

## 2010-05-30 ENCOUNTER — Ambulatory Visit (INDEPENDENT_AMBULATORY_CARE_PROVIDER_SITE_OTHER): Payer: 59 | Admitting: Internal Medicine

## 2010-05-30 DIAGNOSIS — M549 Dorsalgia, unspecified: Secondary | ICD-10-CM

## 2010-05-30 MED ORDER — CYCLOBENZAPRINE HCL 10 MG PO TABS: 10.0000 mg | ORAL_TABLET | Freq: Two times a day (BID) | ORAL | Status: DC | PRN

## 2010-05-30 NOTE — Progress Notes (Signed)
  Subjective:    Patient ID: Antonio Stephens, male    DOB: Dec 01, 1945, 65 y.o.   MRN: [id number]  HPI Developed lower back pain bilaterally today after he leg presses. Historycally,  muscle relaxant helps, would like a Rx. Overall, the pain is getting better.  Past Medical History  Diagnosis Date  . Hyperlipidemia   . 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 Depression   . Back pain   . Hypertension    Past Surgical History  Procedure Date  . Knee arthroscopy 04/2005    right  . Inguinal herniorrhaphy   . 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   . Total hip arthroplasty 12/16/09  . Knee surgery 09-2009    partial replacemente , R     Review of Systems No bladder or bowel incontinence. No lower extremity edema No lower extremity paresthesias.     Objective:   Physical Exam Alert, oriented, no apparent distress. Abdomen not distended, not tender to palpation. Extremities without edema. No TTP on the lower back Neurological exam: Gait is normal, mild distress when he stands up or lay down in the table. Lower extremities with normal motor exam and normal DTRs.       Assessment & Plan:

## 2010-05-30 NOTE — Assessment & Plan Note (Signed)
Back pain after he did leg presses. Pain is getting better, neurological exam normal, the patient requested a muscle relaxant and I think that's reasonable. Drowsiness discussed. See instructions. Recommend gradual return to exercise, the patient is considering a personal trainer I think that's a good idea as he needs to build up his core muscles

## 2010-05-30 NOTE — Patient Instructions (Signed)
Rest, no heavy lifting Flexeril twice d ay as needed  (drowsiness) Tylenol as needed for pain Call if no better in 10 days to 2 weeks

## 2010-06-05 ENCOUNTER — Other Ambulatory Visit: Payer: Self-pay | Admitting: Family Medicine

## 2010-08-04 ENCOUNTER — Other Ambulatory Visit: Payer: Self-pay | Admitting: Family Medicine

## 2010-09-12 ENCOUNTER — Other Ambulatory Visit: Payer: Self-pay

## 2010-09-12 MED ORDER — LISINOPRIL 20 MG PO TABS: 20.0000 mg | ORAL_TABLET | Freq: Every day | ORAL | Status: DC

## 2010-09-15 ENCOUNTER — Encounter: Payer: Self-pay | Admitting: Family Medicine

## 2010-09-15 ENCOUNTER — Ambulatory Visit (INDEPENDENT_AMBULATORY_CARE_PROVIDER_SITE_OTHER): Payer: Managed Care, Other (non HMO) | Admitting: Family Medicine

## 2010-09-15 VITALS — BP 120/80 | HR 69 | Temp 98.6°F | Ht 73.5 in | Wt 195.0 lb

## 2010-09-15 DIAGNOSIS — E785 Hyperlipidemia, unspecified: Secondary | ICD-10-CM

## 2010-09-15 DIAGNOSIS — Z0000 Encounter for general adult medical examination without abnormal findings: Secondary | ICD-10-CM

## 2010-09-15 DIAGNOSIS — R7309 Other abnormal glucose: Secondary | ICD-10-CM

## 2010-09-15 DIAGNOSIS — R739 Hyperglycemia, unspecified: Secondary | ICD-10-CM

## 2010-09-15 LAB — CBC WITH DIFFERENTIAL/PLATELET
Basophils Relative: 0.8 % (ref 0.0–3.0)
Eosinophils Relative: 1.8 % (ref 0.0–5.0)
HCT: 42.7 % (ref 39.0–52.0)
Hemoglobin: 14.4 g/dL (ref 13.0–17.0)
MCV: 93.2 fl (ref 78.0–100.0)
Monocytes Absolute: 0.5 10*3/uL (ref 0.1–1.0)
Neutrophils Relative %: 63.7 % (ref 43.0–77.0)
RBC: 4.58 Mil/uL (ref 4.22–5.81)
WBC: 7.2 10*3/uL (ref 4.5–10.5)

## 2010-09-15 LAB — PSA: PSA: 2.39 ng/mL (ref 0.10–4.00)

## 2010-09-15 LAB — LIPID PANEL
Cholesterol: 159 mg/dL (ref 0–200)
LDL Cholesterol: 96 mg/dL (ref 0–99)
Total CHOL/HDL Ratio: 3

## 2010-09-15 LAB — POCT URINALYSIS DIPSTICK
Clarity, UA: NEGATIVE
Glucose, UA: NEGATIVE
Leukocytes, UA: NEGATIVE
Nitrite, UA: NEGATIVE
Spec Grav, UA: 1.005
Urobilinogen, UA: 0.2

## 2010-09-15 LAB — HEPATIC FUNCTION PANEL
ALT: 21 U/L (ref 0–53)
AST: 27 U/L (ref 0–37)
Total Bilirubin: 1 mg/dL (ref 0.3–1.2)

## 2010-09-15 LAB — BASIC METABOLIC PANEL
Chloride: 104 mEq/L (ref 96–112)
Creatinine, Ser: 1 mg/dL (ref 0.4–1.5)
Potassium: 4.5 mEq/L (ref 3.5–5.1)
Sodium: 140 mEq/L (ref 135–145)

## 2010-09-15 NOTE — Patient Instructions (Signed)
Preventative Care for Adults, Male   MAINTAIN REGULAR HEALTH EXAMS   A routine yearly physical is a good way to check in with your primary care provider about your health and preventive screening. It is also an opportunity to share updates about your health and any concerns you have and receive a thorough all-over exam.   If you smoke or chew tobacco, find out from your caregiver how to quit. It can literally save your life, no matter how long you have been a tobacco user. If you do not use tobacco, never begin.   Maintain a healthy diet and normal weight. Increased weight leads to problems with blood pressure and diabetes. Decrease saturated fat in the diet and increase regular exercise. Get information about proper diet from your caregiver if necessary. Eat a variety of foods, including fruit, vegetables, animal or vegetable protein, such as meat, fish, chicken, and eggs, or beans, lentils, tofu, and grains, such as rice.   High blood pressure causes heart and blood vessel problems. Fat leaves deposits in your arteries that can block them. This causes heart disease and vessel disease elsewhere in your body. Check your blood pressure regularly and keep it within normal limits. Men over age 50 or those who have a family history of high blood pressure should have it checked at least every year.   Aerobic exercise helps maintain good heart health. 30 minutes of moderate-intensity exercise is recommended. For example, a brisk walk that increases your heart rate and breathing. This walk should be done on most days of the week. Persistent high blood pressure should be treated with medicine if weight loss and exercise do not work.   For many men aged 20 and older, having a cholesterol test of the blood every 5 years is recommended. If your cholesterol is found to be borderline high, or if you have heart disease or certain other medical conditions, then you may need to have it monitored more frequently.   Avoid smoking,  drinking alcohol in excess (more than two drinks per day) or use of street drugs. Do not share needles with anyone. Ask for professional help if you need assistance or instructions on stopping the use of alcohol, cigarettes, and/or drugs.   Maintain normal blood lipids and cholesterol by minimizing your intake of saturated fat. Eat a well rounded diet otherwise, with plenty of fruit and vegetables. The National Institutes of Health encourage men to eat 5-9 servings of fruit and vegetables each day. Your caregiver can help you keep your risk of heart disease or stroke at a lower level.   Ask your caregiver if you are in need of earlier testing because of: a strong family history of heart disease, or you have signs of elevated testosterone (male sex hormone) levels. This can predispose you to earlier heart disease. Ask if you should have a stress test if your history suggests this. A stress test is a test done on a treadmill that looks for heart disease. This test can find disease prior to there being a problem.   Diabetes screening assesses your blood sugar level after a fasting once every 3 years after age 45 if previous tests were normal.   Most routine colon cancer screening begins at the age of 50. On a yearly basis, doctors may provide special easy to use take-home tests to check for hidden blood in the stool. Sigmoidoscopy or colonoscopy can detect the earliest forms of colon cancer and is life saving. These test use a   small camera at the end of a tube to directly examine the colon. Speak to your caregiver about this at age 50, when routine screening begins (and is repeated every 5 years unless early forms of pre-cancerous polyps or small growths are found).   At the age of 50 men usually start screening for prostate cancer every year. Screening may begin at a younger age for those with higher risk. Those at higher risk include African-Americans or having a family history of prostate cancer. There are two types  of tests for prostate cancer:   Prostate-specific antigen (PSA) testing. Recent studies raise questions about prostate cancer using PSA and you should discuss this with your caregiver.   Digital rectal exam (in which your doctor’s lubricated and gloved finger feels for enlargement of the prostate through the anus).   Practice safe sex. Use condoms. Condoms are used for birth control and to help reduce the spread of sexually transmitted infections (or STIs). Unsafe sex is having an unprotected physical relationship with someone who is bisexual, homosexual, uses intravenous street drugs, or going with someone who has sexual relations with high-risk groups. Practicing safe sex helps you avoid getting an STI. Some of the STIs are gonorrhea (the clap), chlamydia, syphilis, trichimonas, herpes, HPV (human papiloma virus) and HIV (human immunodeficiency virus) which causes AIDS. The herpes, HIV and HPV are viral illnesses that have no cure. These can result in disability, cancer and death.   It is not safe for someone who has AIDS or is HIV positive to have unprotected sex with someone else who is positive. The reason for this is the fact that there are many different strains of HIV. If you have a strain that is readily treated with medications and then suddenly introduce a strain from a partner that has no further treatment options, you may suddenly have a strain of HIV that is untreatable. Even if you are both positive for HIV, it is still necessary to practice safe sex.   Use sunscreen with a SPF (or skin protection factor) of 15 or greater. Apply sunscreen liberally and repeatedly throughout the day. Being outside in the sun when your shadow caused by the sun is shorter than you are, means you are being exposed to sun at greater intensity. Lighter skinned people are at a greater risk of skin cancer. Don’t forget to also wear sunglasses in order to protect your eyes from too much damaging sunlight. Damaging sunlight can  accelerate cataract formation.   Once a month do a whole body skin exam or review, using a mirror to look at your back. Notify your caregivers of changes in moles, especially if there are changes in shapes, colors, a size larger than a pencil eraser, an irregular border, or development of new moles.   Keep carbon monoxide and smoke detectors in your home functioning at all times. Change the batteries every 6 months or use a model that plugs into the wall.   Do a monthly exam of your testicles. Gently roll each testicle between your thumb and fingers, feeling for any abnormal lumps. The best time to do this is after a hot shower or bath when the tissues are looser. Notify your caregivers of any lumps, tenderness or changes in size or shape immediately.   Stay up to date with your tetanus shots and other required immunizations. You should have a booster for tetanus every 10 years. Be sure to get your flu shot every year, since 5%-20% of the [place] population   comes down with the flu. The flu vaccine changes each year, so being vaccinated once is not enough. Get your shot in the fall, before the flu season peaks. The table below lists important vaccines to get. Other vaccines to consider include:   Hepatitis A virus (to prevent a form of infection of the liver by a virus acquired from food), Varicella Zoster (a virus that causes shingles).   Meningococcal (against bacteria which cause a form of meningitis).   Brush your teeth twice a day with fluoride toothpaste, and floss once a day. Good oral hygiene prevents tooth decay and gum disease. The problems can be painful, unattractive, and can cause other health problems. Visit your dentist for a routine oral and dental check up and preventive care every 6-12 months.   The Body Mass Index or BMI is a way of measuring how much of your body is fat. Having a BMI above 27 increases the risk of heart disease, diabetes, hypertension, stroke and other problems related to obesity.  Your caregiver can help determine your BMI and based on it develop an exercise and dietary program to help you achieve or maintain this important measurement at a healthful level.   Wear seat belts whenever in a vehicle, whether a passenger or driver, and even for very short drives of a few minutes.   If you bicycle, wear a helmet at all times.   Below is a summary of the most important preventative healthcare services that adult males should seek on a regular basis throughout their lives:   Preventative Care for Adult Males    Preventative Service  Ages 19-39  Ages 40-64  Ages 65 and over    Schedule of medical visits  Every 5 years  Every 5 years     Schedule of dental visits  Every 6-12 months  Every 6-12 months     Health risk assessment and lifestyle counseling  Every 3-5 years  Every 3-5 years  Every 3-5 years    Blood pressure check**  Every 2 years  Every 2 years  Every 2 years    Total cholesterol check including HDL**  Every 5 years beginning at age 35  Every 5 years  Every 5 years through age 75, then optional.    Flexible sigmoidoscopy or colonoscopy**   Every 5 years beginning at age 50  Every 5 years through age 80, then optional.    Prostate screening   Every year beginning at age 50  Every Year    Testicular exam  Monthly  Monthly  Monthly    FOBT (fecal occult blood test)   Every year beginning at age 50  Every year until 80, then optional.    Skin self-exam  Monthly  Monthly  Monthly    Tetanus-diphtheria (Td) immunization  Every 10 years  Every 10 years  Every 10 years    Influenza immunization**  Every year  Every year  Every year    Pneumococcal immunization**  Optional  Optional  Every 5 years    Hepatitis B immunization**  Series of 3 immunizations   (if not done previously, usually given at 0, 1 to 2, and 4 to 6 months)  Check with your caregiver if vaccination not previously given.  Check with your caregiver if vaccination not previously given.    **Family history and personal history of  risk and conditions may change your physician's recommendations.    Document Released: 02/17/2001 Document Re-Released: 03/18/2009   Exit[employer]® Patient Information ©  2011 [employer].

## 2010-09-15 NOTE — Progress Notes (Signed)
Subjective:    Antonio Stephens is a 65 y.o. male who presents for Medicare Annual/Subsequent preventive examination.   Preventive Screening-Counseling & Management  Tobacco History  Smoking status  . Former Smoker -- .2 years  . Types: Cigarettes  . Quit date: 09/15/1967  Smokeless tobacco  . Not on file    Problems Prior to Visit 1.   Current Problems (verified) Patient Active Problem List  Diagnoses  . HYPERLIPIDEMIA  . Unspecified essential hypertension  . DIASTOLIC DYSFUNCTION  . LOC OSTEOARTHROS NOT SPEC PRIM/SEC PELV RGN&THI  . DEGENERATIVE JOINT DISEASE, ADVANCED  . BACK PAIN  . FREQUENCY, URINARY  . HYPERGLYCEMIA, FASTING  . DEPRESSION, HX OF  . CEREBROVASCULAR ACCIDENT, HX OF    Medications Prior to Visit Current Outpatient Prescriptions on File Prior to Visit  Medication Sig Dispense Refill  . alendronate (FOSAMAX) 70 MG tablet Take 70 mg by mouth every 7 (seven) days. Take with a full glass of water on an empty stomach.       Marland Kitchen aspirin 325 MG EC tablet Take 325 mg by mouth daily.        . CRESTOR 20 MG tablet TAKE 1 TABLET BY MOUTH AT BEDTIME  90 tablet  0  . Glucosamine-Chondroit-Vit C-Mn (GLUCOSAMINE CHONDR 1500 COMPLX PO) Take 1 tablet by mouth 2 (two) times daily.       Marland Kitchen lisinopril (PRINIVIL,ZESTRIL) 20 MG tablet Take 1 tablet (20 mg total) by mouth daily.  30 tablet  2  . multivitamin (THERAGRAN) per tablet Take 1 tablet by mouth daily.        Marland Kitchen omega-3 acid ethyl esters (LOVAZA) 1 G capsule 2 by mouth two times a day.       . sertraline (ZOLOFT) 100 MG tablet Take 100 mg by mouth daily.          Current Medications (verified) Current Outpatient Prescriptions  Medication Sig Dispense Refill  . alendronate (FOSAMAX) 70 MG tablet Take 70 mg by mouth every 7 (seven) days. Take with a full glass of water on an empty stomach.       Marland Kitchen aspirin 325 MG EC tablet Take 325 mg by mouth daily.        . Calcium Carbonate-Vit D-Min (CALCIUM 1200 PO) Take 1  tablet by mouth 2 (two) times daily.        . cholecalciferol (VITAMIN D) 1000 UNITS tablet Take 1,000 Units by mouth 2 (two) times daily.        . CRESTOR 20 MG tablet TAKE 1 TABLET BY MOUTH AT BEDTIME  90 tablet  0  . Glucosamine-Chondroit-Vit C-Mn (GLUCOSAMINE CHONDR 1500 COMPLX PO) Take 1 tablet by mouth 2 (two) times daily.       Marland Kitchen lisinopril (PRINIVIL,ZESTRIL) 20 MG tablet Take 1 tablet (20 mg total) by mouth daily.  30 tablet  2  . multivitamin (THERAGRAN) per tablet Take 1 tablet by mouth daily.        Marland Kitchen omega-3 acid ethyl esters (LOVAZA) 1 G capsule 2 by mouth two times a day.       . sertraline (ZOLOFT) 100 MG tablet Take 100 mg by mouth daily.           Allergies (verified) Review of patient's allergies indicates no known allergies.   PAST HISTORY  Family History Family History  Problem Relation Age of Onset  . Alzheimer's disease Mother 66  . Cancer Father 66    sm cell carcinoma lung  . Cancer Sister  58    breast  . Hyperlipidemia Paternal Grandfather     Social History History  Substance Use Topics  . Smoking status: Former Smoker -- .2 years    Types: Cigarettes    Quit date: 09/15/1967  . Smokeless tobacco: Not on file  . Alcohol Use: Not on file    Are there smokers in your home (other than you)?  No  Risk Factors Current exercise habits: 2-3 x a week Dietary issues discussed: none Cardiac risk factors: dyslipidemia, hypertension and male gender.  Depression Screen (Note: if answer to either of the following is "Yes", a more complete depression screening is indicated)   Q1: Over the past two weeks, have you felt down, depressed or hopeless? No  Q2: Over the past two weeks, have you felt [place] interest or pleasure in doing things? No  Have you lost interest or pleasure in daily life? No  Do you often feel hopeless? No  Do you cry easily over simple problems? No  Activities of Daily Living In your present state of health, do you have any difficulty  performing the following activities?:  Driving? No Managing money?  No Feeding yourself? No Getting from bed to chair? No Climbing a flight of stairs? No Preparing food and eating?: No Bathing or showering? No Getting dressed: No Getting to the toilet? No Using the toilet:No Moving around from place to place: No In the past year have you fallen or had a near fall?:No   Are you sexually active?  Yes  Do you have more than one partner?  No  Hearing Difficulties: No Do you often ask people to speak up or repeat themselves? No Do you experience ringing or noises in your ears? No Do you have difficulty understanding soft or whispered voices? No   Do you feel that you have a problem with memory? No  Do you often misplace items? No  Do you feel safe at home?  No  Cognitive Testing  Alert? Yes  Normal Appearance?Yes  Oriented to person? Yes  Place? Yes   Time? Yes  Recall of three objects?  Yes  Can perform simple calculations? Yes  Displays appropriate judgment?Yes  Can read the correct time from a watch face?Yes   Advanced Directives have been discussed with the patient? Yes   List the Names of Other Physician/Practitioners you currently use: 1.  na  Indicate any recent Medical Services you may have received from other than [facility] providers in the past year (date may be approximate).  Immunization History  Administered Date(s) Administered  . Pneumococcal Polysaccharide 09/05/2009  . Td 09/05/2009    Screening Tests Health Maintenance  Topic Date Due  . Colonoscopy  11/10/1993  . Zostavax  11/11/2003  . Influenza Vaccine  10/06/2010  . Tetanus/tdap  09/06/2019  . Pneumococcal Polysaccharide Vaccine Age 14 And Over  Completed    All answers were reviewed with the patient and necessary referrals were made:  Loreen Freud, DO   09/15/2010   History reviewed: allergies, current medications, past family history, past medical history, past social history, past surgical  history and problem list  Review of Systems  Review of Systems  Constitutional: Negative for activity change, appetite change and fatigue.  HENT: Negative for hearing loss, congestion, tinnitus and ear discharge.   Eyes: Negative for visual disturbance (see optho q1y -- vision corrected to 20/20 with glasses).  Respiratory: Negative for cough, chest tightness and shortness of breath.   Cardiovascular: Negative for chest  pain, palpitations and leg swelling.  Gastrointestinal: Negative for abdominal pain, diarrhea, constipation and abdominal distention.  Genitourinary: Negative for urgency, frequency, decreased urine volume and difficulty urinating.  Musculoskeletal: Negative for back pain, arthralgias and gait problem.  Skin: Negative for color change, pallor and rash.  Neurological: Negative for dizziness, light-headedness, numbness and headaches.  Hematological: Negative for adenopathy. Does not bruise/bleed easily.  Psychiatric/Behavioral: Negative for suicidal ideas, confusion, sleep disturbance, self-injury, dysphoric mood, decreased concentration and agitation.  Pt is able to read and write and can do all ADLs No risk for falling No abuse/ violence in home    Objective:     Vision by Snellen chart: right ZOX:WRUEA, left VWU:JWJXB Blood pressure 120/80, pulse 69, temperature 98.6 F (37 C), temperature source Oral, height 6' 1.5" (1.867 m), weight 195 lb (88.451 kg), SpO2 97.00%. Body mass index is 25.38 kg/(m^2).  BP 120/80  Pulse 69  Temp(Src) 98.6 F (37 C) (Oral)  Ht 6' 1.5" (1.867 m)  Wt 195 lb (88.451 kg)  BMI 25.38 kg/m2  SpO2 97%  General Appearance:    Alert, cooperative, no distress, appears stated age  Head:    Normocephalic, without obvious abnormality, atraumatic  Eyes:    PERRL, conjunctiva/corneas clear, EOM's intact, fundi    benign, both eyes       Ears:    Normal TM's and external ear canals, both ears  Nose:   Nares normal, septum midline, mucosa  normal, no drainage    or sinus tenderness  Throat:   Lips, mucosa, and tongue normal; teeth and gums normal  Neck:   Supple, symmetrical, trachea midline, no adenopathy;       thyroid:  No enlargement/tenderness/nodules; no carotid   bruit or JVD  Back:     Symmetric, no curvature, ROM normal, no CVA tenderness  Lungs:     Clear to auscultation bilaterally, respirations unlabored  Chest wall:    No tenderness or deformity  Heart:    Regular rate and rhythm, S1 and S2 normal, no murmur, rub   or gallop  Abdomen:     Soft, non-tender, bowel sounds active all four quadrants,    no masses, no organomegaly  Genitalia:    Normal male without lesion, discharge or tenderness  Rectal:    Normal tone, normal prostate, no masses or tenderness;   guaiac negative stool  Extremities:   Extremities normal, atraumatic, no cyanosis or edema  Pulses:   2+ and symmetric all extremities  Skin:   Skin color, texture, turgor normal, no rashes or lesions  Lymph nodes:   Cervical, supraclavicular, and axillary nodes normal  Neurologic:   CNII-XII intact. Normal strength, sensation and reflexes      throughout       Assessment:     preventative care  hyperlipidemia-- con't meds,  Check labs   hyperglycemia-- check labs   Plan:     During the course of the visit the patient was educated and counseled about appropriate screening and preventive services including:    Pneumococcal vaccine   Screening electrocardiogram  Prostate cancer screening  Advanced directives: has an advanced directive - a copy HAS NOT been provided.  Diet review for nutrition referral? Yes ____  Not Indicated ___x_   Patient Instructions (the written plan) was given to the patient.  Medicare Attestation I have personally reviewed: The patient's medical and social history Their use of alcohol, tobacco or illicit drugs Their current medications and supplements The patient's functional ability including  ADLs,fall risks,  home safety risks, cognitive, and hearing and visual impairment Diet and physical activities Evidence for depression or mood disorders  The patient's weight, height, BMI, and visual acuity have been recorded in the chart.  I have made referrals, counseling, and provided education to the patient based on review of the above and I have provided the patient with a written personalized care plan for preventive services.     Loreen Freud, DO   09/15/2010

## 2010-09-21 ENCOUNTER — Other Ambulatory Visit: Payer: Self-pay | Admitting: Family Medicine

## 2010-11-10 ENCOUNTER — Other Ambulatory Visit: Payer: Self-pay | Admitting: Family Medicine

## 2010-12-10 ENCOUNTER — Ambulatory Visit (INDEPENDENT_AMBULATORY_CARE_PROVIDER_SITE_OTHER): Payer: Managed Care, Other (non HMO) | Admitting: *Deleted

## 2010-12-10 DIAGNOSIS — Z23 Encounter for immunization: Secondary | ICD-10-CM

## 2010-12-12 ENCOUNTER — Telehealth: Payer: Self-pay

## 2010-12-12 NOTE — Telephone Encounter (Signed)
Call from patient and he stated that he was given the shingles vaccine and now he is having a soreness in the throat and nasal drainage and wanted to know if he was having an allergic reaction. I advise it sounded like he was coming down with something else and if he would like an apt he could see Dr.Hopper today at 4:45 and patient declined. No fever and no other symptoms I advised he is not better he could follow up at the Saturday clinic and he agreed.    Kp

## 2010-12-21 ENCOUNTER — Other Ambulatory Visit: Payer: Self-pay | Admitting: Family Medicine

## 2011-05-26 ENCOUNTER — Other Ambulatory Visit: Payer: Self-pay | Admitting: Family Medicine

## 2011-06-23 ENCOUNTER — Other Ambulatory Visit: Payer: Self-pay | Admitting: Family Medicine

## 2011-08-26 ENCOUNTER — Other Ambulatory Visit: Payer: Self-pay | Admitting: Family Medicine

## 2011-09-23 ENCOUNTER — Other Ambulatory Visit: Payer: Self-pay | Admitting: Family Medicine

## 2011-09-23 DIAGNOSIS — Z125 Encounter for screening for malignant neoplasm of prostate: Secondary | ICD-10-CM

## 2011-09-23 DIAGNOSIS — E785 Hyperlipidemia, unspecified: Secondary | ICD-10-CM

## 2011-09-23 DIAGNOSIS — R739 Hyperglycemia, unspecified: Secondary | ICD-10-CM

## 2011-09-23 DIAGNOSIS — Z0000 Encounter for general adult medical examination without abnormal findings: Secondary | ICD-10-CM

## 2011-09-23 DIAGNOSIS — I10 Essential (primary) hypertension: Secondary | ICD-10-CM

## 2011-09-24 ENCOUNTER — Other Ambulatory Visit (INDEPENDENT_AMBULATORY_CARE_PROVIDER_SITE_OTHER): Payer: Managed Care, Other (non HMO)

## 2011-09-24 DIAGNOSIS — R739 Hyperglycemia, unspecified: Secondary | ICD-10-CM

## 2011-09-24 DIAGNOSIS — R7309 Other abnormal glucose: Secondary | ICD-10-CM

## 2011-09-24 DIAGNOSIS — I10 Essential (primary) hypertension: Secondary | ICD-10-CM

## 2011-09-24 DIAGNOSIS — Z125 Encounter for screening for malignant neoplasm of prostate: Secondary | ICD-10-CM

## 2011-09-24 DIAGNOSIS — E785 Hyperlipidemia, unspecified: Secondary | ICD-10-CM

## 2011-09-24 LAB — URINALYSIS
Bilirubin Urine: NEGATIVE
Hgb urine dipstick: NEGATIVE
Ketones, ur: NEGATIVE
Leukocytes, UA: NEGATIVE
Nitrite: NEGATIVE

## 2011-09-24 LAB — CBC WITH DIFFERENTIAL/PLATELET
Eosinophils Absolute: 0.3 10*3/uL (ref 0.0–0.7)
Lymphocytes Relative: 29 % (ref 12.0–46.0)
MCHC: 33.2 g/dL (ref 30.0–36.0)
MCV: 93.8 fl (ref 78.0–100.0)
Monocytes Absolute: 0.8 10*3/uL (ref 0.1–1.0)
Neutrophils Relative %: 57.9 % (ref 43.0–77.0)
Platelets: 236 10*3/uL (ref 150.0–400.0)
RBC: 4.55 Mil/uL (ref 4.22–5.81)
WBC: 8.6 10*3/uL (ref 4.5–10.5)

## 2011-09-24 LAB — HEMOGLOBIN A1C: Hgb A1c MFr Bld: 5.6 % (ref 4.6–6.5)

## 2011-09-24 LAB — HEPATIC FUNCTION PANEL
Bilirubin, Direct: 0.1 mg/dL (ref 0.0–0.3)
Total Bilirubin: 0.8 mg/dL (ref 0.3–1.2)

## 2011-09-24 LAB — LIPID PANEL
HDL: 46.5 mg/dL (ref >39.00)
LDL Cholesterol: 90 mg/dL (ref 0–99)
VLDL: 18 mg/dL (ref 0.0–40.0)

## 2011-09-24 LAB — BASIC METABOLIC PANEL
BUN: 23 mg/dL (ref 6–23)
Calcium: 8.6 mg/dL (ref 8.4–10.5)
Chloride: 103 mEq/L (ref 96–112)
Creatinine, Ser: 1 mg/dL (ref 0.4–1.5)

## 2011-09-24 LAB — PSA: PSA: 3.97 ng/mL (ref 0.10–4.00)

## 2011-09-24 NOTE — Progress Notes (Signed)
Labs only

## 2011-10-05 ENCOUNTER — Other Ambulatory Visit: Payer: Self-pay | Admitting: Family Medicine

## 2011-10-05 MED ORDER — LISINOPRIL 20 MG PO TABS: ORAL_TABLET | ORAL | Status: DC

## 2011-10-05 MED ORDER — ROSUVASTATIN CALCIUM 20 MG PO TABS: 20.0000 mg | ORAL_TABLET | Freq: Every day | ORAL | Status: DC

## 2011-10-05 NOTE — Telephone Encounter (Signed)
refill lisniopril & crestor both last fill 9.19.13---last ov 9.10.12 V70

## 2011-10-16 ENCOUNTER — Ambulatory Visit (INDEPENDENT_AMBULATORY_CARE_PROVIDER_SITE_OTHER): Payer: Managed Care, Other (non HMO) | Admitting: Family Medicine

## 2011-10-16 ENCOUNTER — Encounter: Payer: Self-pay | Admitting: Family Medicine

## 2011-10-16 VITALS — BP 124/82 | HR 85 | Temp 97.9°F | Ht 72.0 in | Wt 195.2 lb

## 2011-10-16 DIAGNOSIS — B731 Onchocerciasis without eye disease: Secondary | ICD-10-CM

## 2011-10-16 DIAGNOSIS — B7309 Onchocerciasis with other eye involvement: Secondary | ICD-10-CM

## 2011-10-16 DIAGNOSIS — B351 Tinea unguium: Secondary | ICD-10-CM

## 2011-10-16 DIAGNOSIS — H3090 Unspecified chorioretinal inflammation, unspecified eye: Secondary | ICD-10-CM

## 2011-10-16 NOTE — Assessment & Plan Note (Signed)
Refer to podiatry for eval of foot

## 2011-10-16 NOTE — Patient Instructions (Addendum)
Ringworm, Nail  A fungal infection of the nail (tinea unguium/onychomycosis) is common. It is common as the visible part of the nail is composed of dead cells which have no blood supply to help prevent infection. It occurs because fungi are everywhere and will pick any opportunity to grow on any dead material.  Because nails are very slow growing they require up to 2 years of treatment with anti-fungal medications. The entire nail back to the base is infected. This includes approximately  of the nail which you cannot see.  If your caregiver has prescribed a medication by mouth, take it every day and as directed. No progress will be seen for at least 6 to 9 months. Do not be disappointed! Because fungi live on dead cells with [place] or no exposure to blood supply, medication delivery to the infection is slow; thus the cure is slow. It is also why you can observe no progress in the first 6 months. The nail becoming cured is the base of the nail, as it has the blood supply. Topical medication such as creams and ointments are usually not effective. Important in successful treatment of nail fungus is closely following the medication regimen that your doctor prescribes.  Sometimes you and your caregiver may elect to speed up this process by surgical removal of all the nails. Even this may still require 6 to 9 months of additional oral medications.  See your caregiver as directed. Remember there will be no visible improvement for at least 6 months. See your caregiver sooner if other signs of infection (redness and swelling) develop.  Document Released: 12/20/1999 Document Revised: 03/16/2011 Document Reviewed: 02/28/2008  [employer] Patient Information 2013 [employer].

## 2011-10-16 NOTE — Progress Notes (Signed)
  Subjective:    Patient ID: Antonio Stephens, male    DOB: [birth year], 66 y.o.   MRN: [id number]  HPI Pt here c/o numb or full feeling in ball of his L foot.  He also c/o possible fungus and tenderness big toe on same foot.   No other complaints.     Review of Systems As above    Objective:   Physical Exam  Constitutional: He is oriented to person, place, and time. He appears well-developed.  Cardiovascular: Regular rhythm.   Musculoskeletal:       L foot---probably fungus big toenail              No swelling ball of foot or tenderness with palp              Monofilament neg  Neurological: He is alert and oriented to person, place, and time.  Psychiatric: He has a normal mood and affect. His behavior is normal. Thought content normal.          Assessment & Plan:

## 2011-10-21 ENCOUNTER — Other Ambulatory Visit: Payer: Self-pay | Admitting: Family Medicine

## 2011-10-21 MED ORDER — LISINOPRIL 20 MG PO TABS: ORAL_TABLET | ORAL | Status: DC

## 2011-10-21 NOTE — Telephone Encounter (Signed)
Refill lisinopril 20mg  tablets #30 TK 1 T PO D --last fill 10.13.13-last ov 10.11.13 acute

## 2011-10-21 NOTE — Telephone Encounter (Signed)
Rx sent.    MW 

## 2011-10-27 ENCOUNTER — Encounter: Payer: Self-pay | Admitting: Family Medicine

## 2011-11-04 ENCOUNTER — Ambulatory Visit (INDEPENDENT_AMBULATORY_CARE_PROVIDER_SITE_OTHER): Payer: Managed Care, Other (non HMO) | Admitting: Family Medicine

## 2011-11-04 ENCOUNTER — Telehealth: Payer: Self-pay | Admitting: Family Medicine

## 2011-11-04 ENCOUNTER — Encounter: Payer: Self-pay | Admitting: Family Medicine

## 2011-11-04 VITALS — BP 122/66 | HR 70 | Temp 98.6°F | Wt 193.6 lb

## 2011-11-04 DIAGNOSIS — I10 Essential (primary) hypertension: Secondary | ICD-10-CM

## 2011-11-04 DIAGNOSIS — Z0000 Encounter for general adult medical examination without abnormal findings: Secondary | ICD-10-CM

## 2011-11-04 DIAGNOSIS — E785 Hyperlipidemia, unspecified: Secondary | ICD-10-CM

## 2011-11-04 MED ORDER — ROSUVASTATIN CALCIUM 20 MG PO TABS: 20.0000 mg | ORAL_TABLET | Freq: Every day | ORAL | Status: DC

## 2011-11-04 NOTE — Telephone Encounter (Signed)
Last lipid check was 09/24/11- instructions indicate recheck in 6 months (03/2012). RX ok'd til March

## 2011-11-04 NOTE — Assessment & Plan Note (Signed)
Check labs Cont' meds 

## 2011-11-04 NOTE — Progress Notes (Deleted)
Subjective:    Antonio Stephens is a 66 y.o. male who presents for Medicare Annual/Subsequent preventive examination.   Preventive Screening-Counseling & Management  Tobacco History  Smoking status  . Former Smoker -- .2 years  . Types: Cigarettes  . Quit date: 09/15/1967  Smokeless tobacco  . Not on file    Problems Prior to Visit 1.   Current Problems (verified) Patient Active Problem List  Diagnosis  . HYPERLIPIDEMIA  . Unspecified essential hypertension  . DIASTOLIC DYSFUNCTION  . LOC OSTEOARTHROS NOT SPEC PRIM/SEC PELV RGN&THI  . DEGENERATIVE JOINT DISEASE, ADVANCED  . BACK PAIN  . FREQUENCY, URINARY  . HYPERGLYCEMIA, FASTING  . DEPRESSION, HX OF  . CEREBROVASCULAR ACCIDENT, HX OF  . Onychomycosis    Medications Prior to Visit Current Outpatient Prescriptions on File Prior to Visit  Medication Sig Dispense Refill  . alendronate (FOSAMAX) 70 MG tablet Take 70 mg by mouth every 7 (seven) days. Take with a full glass of water on an empty stomach.       Marland Kitchen aspirin 325 MG EC tablet Take 325 mg by mouth daily.        . Calcium Carbonate-Vit D-Min (CALCIUM 1200 PO) Take 1 tablet by mouth 2 (two) times daily.        . cholecalciferol (VITAMIN D) 1000 UNITS tablet Take 1,000 Units by mouth 2 (two) times daily.        . Glucosamine-Chondroit-Vit C-Mn (GLUCOSAMINE CHONDR 1500 COMPLX PO) Take 1 tablet by mouth 2 (two) times daily.       Marland Kitchen lisinopril (PRINIVIL,ZESTRIL) 20 MG tablet 1 tab by mouth daily---office visit due now  30 tablet  0  . LOVAZA 1 G capsule TAKE 2 CAPSULES TWICE A DAY  120 capsule  6  . multivitamin (THERAGRAN) per tablet Take 1 tablet by mouth daily.        . rosuvastatin (CRESTOR) 20 MG tablet Take 1 tablet (20 mg total) by mouth daily.  30 tablet  0  . sertraline (ZOLOFT) 100 MG tablet Take 100 mg by mouth daily.          Current Medications (verified) Current Outpatient Prescriptions  Medication Sig Dispense Refill  . alendronate (FOSAMAX) 70 MG  tablet Take 70 mg by mouth every 7 (seven) days. Take with a full glass of water on an empty stomach.       Marland Kitchen aspirin 325 MG EC tablet Take 325 mg by mouth daily.        . Calcium Carbonate-Vit D-Min (CALCIUM 1200 PO) Take 1 tablet by mouth 2 (two) times daily.        . cholecalciferol (VITAMIN D) 1000 UNITS tablet Take 1,000 Units by mouth 2 (two) times daily.        . Glucosamine-Chondroit-Vit C-Mn (GLUCOSAMINE CHONDR 1500 COMPLX PO) Take 1 tablet by mouth 2 (two) times daily.       Marland Kitchen lisinopril (PRINIVIL,ZESTRIL) 20 MG tablet 1 tab by mouth daily---office visit due now  30 tablet  0  . LOVAZA 1 G capsule TAKE 2 CAPSULES TWICE A DAY  120 capsule  6  . multivitamin (THERAGRAN) per tablet Take 1 tablet by mouth daily.        . rosuvastatin (CRESTOR) 20 MG tablet Take 1 tablet (20 mg total) by mouth daily.  30 tablet  0  . sertraline (ZOLOFT) 100 MG tablet Take 100 mg by mouth daily.           Allergies (verified) Review of  patient's allergies indicates no known allergies.   PAST HISTORY  Family History Family History  Problem Relation Age of Onset  . Alzheimer's disease Mother 66  . Cancer Father 66    sm cell carcinoma lung  . Cancer Sister 56    breast  . Hyperlipidemia Paternal Grandfather     Social History History  Substance Use Topics  . Smoking status: Former Smoker -- .2 years    Types: Cigarettes    Quit date: 09/15/1967  . Smokeless tobacco: Not on file  . Alcohol Use: Not on file    Are there smokers in your home (other than you)?  No  Risk Factors Current exercise habits: Home exercise routine includes gym and pool.  Dietary issues discussed: na   Cardiac risk factors: advanced age (older than 28 for men, 77 for women), dyslipidemia, hypertension and male gender.  Depression Screen (Note: if answer to either of the following is "Yes", a more complete depression screening is indicated)   Q1: Over the past two weeks, have you felt down, depressed or hopeless?  No  Q2: Over the past two weeks, have you felt [place] interest or pleasure in doing things? No  Have you lost interest or pleasure in daily life? No  Do you often feel hopeless? No  Do you cry easily over simple problems? No  Activities of Daily Living In your present state of health, do you have any difficulty performing the following activities?:  Driving? No Managing money?  No Feeding yourself? No Getting from bed to chair? No Climbing a flight of stairs? No Preparing food and eating?: No Bathing or showering? No Getting dressed: No Getting to the toilet? No Using the toilet:No Moving around from place to place: No In the past year have you fallen or had a near fall?:No   Are you sexually active?  Yes  Do you have more than one partner?  No  Hearing Difficulties: No Do you often ask people to speak up or repeat themselves? No Do you experience ringing or noises in your ears? No Do you have difficulty understanding soft or whispered voices? No   Do you feel that you have a problem with memory? No  Do you often misplace items? No  Do you feel safe at home?  Yes  Cognitive Testing  Alert? Yes  Normal Appearance?Yes  Oriented to person? Yes  Place? Yes   Time? Yes  Recall of three objects?  Yes  Can perform simple calculations? Yes  Displays appropriate judgment?Yes  Can read the correct time from a watch face?Yes   Advanced Directives have been discussed with the patient? Yes   List the Names of Other Physician/Practitioners you currently use: 1.    Indicate any recent Medical Services you may have received from other than [facility] providers in the past year (date may be approximate).  Immunization History  Administered Date(s) Administered  . Pneumococcal Polysaccharide 09/05/2009  . Td 09/05/2009  . Zoster 12/10/2010    Screening Tests Health Maintenance  Topic Date Due  . Colonoscopy  11/10/1993  . Influenza Vaccine  09/06/2011  . Tetanus/tdap  09/06/2019  .  Pneumococcal Polysaccharide Vaccine Age 46 And Over  Completed  . Zostavax  Completed    All answers were reviewed with the patient and necessary referrals were made:  Loreen Freud, DO   11/04/2011   History reviewed: allergies, current medications, past family history, past medical history, past social history, past surgical history and problem list  Review of Systems Review of Systems  Constitutional: Negative for activity change, appetite change and fatigue.  HENT: Negative for hearing loss, congestion, tinnitus and ear discharge.  dentist q8m Eyes: Negative for visual disturbance (see optho q1y -- vision corrected to 20/20 with glasses).  Respiratory: Negative for cough, chest tightness and shortness of breath.   Cardiovascular: Negative for chest pain, palpitations and leg swelling.  Gastrointestinal: Negative for abdominal pain, diarrhea, constipation and abdominal distention.  Genitourinary: Negative for urgency, frequency, decreased urine volume and difficulty urinating.  Musculoskeletal: Negative for back pain, arthralgias and gait problem.  Skin: Negative for color change, pallor and rash.  Neurological: Negative for dizziness, light-headedness, numbness and headaches.  Hematological: Negative for adenopathy. Does not bruise/bleed easily.  Psychiatric/Behavioral: Negative for suicidal ideas, confusion, sleep disturbance, self-injury, dysphoric mood, decreased concentration and agitation.        Objective:     Vision by Snellen chart: right eye: Blood pressure 122/66, pulse 70, temperature 98.6 F (37 C), temperature source Oral, weight 193 lb 9.6 oz (87.816 kg), SpO2 95.00%. There is no height on file to calculate BMI.  {Exam, AVWU:98119}     Assessment:     ***     Plan:     During the course of the visit the patient was educated and counseled about appropriate screening and preventive services including:    {plan:19837}  Diet review for nutrition  referral? Yes ____  Not Indicated ____   Patient Instructions (the written plan) was given to the patient.  Medicare Attestation I have personally reviewed: The patient's medical and social history Their use of alcohol, tobacco or illicit drugs Their current medications and supplements The patient's functional ability including ADLs,fall risks, home safety risks, cognitive, and hearing and visual impairment Diet and physical activities Evidence for depression or mood disorders  The patient's weight, height, BMI, and visual acuity have been recorded in the chart.  I have made referrals, counseling, and provided education to the patient based on review of the above and I have provided the patient with a written personalized care plan for preventive services.     Loreen Freud, DO   11/04/2011

## 2011-11-04 NOTE — Telephone Encounter (Signed)
Refill: Crestor 20 mg tablets. TK 1 T PO D. Qty 30. Last fill 10-31-11

## 2011-11-04 NOTE — Patient Instructions (Addendum)
Preventive Care for Adults, Male A healthy lifestyle and preventive care can promote health and wellness. Preventive health guidelines for men include the following key practices:  A routine yearly physical is a good way to check with your caregiver about your health and preventative screening. It is a chance to share any concerns and updates on your health, and to receive a thorough exam.  Visit your dentist for a routine exam and preventative care every 6 months. Brush your teeth twice a day and floss once a day. Good oral hygiene prevents tooth decay and gum disease.  The frequency of eye exams is based on your age, health, family medical history, use of contact lenses, and other factors. Follow your caregiver's recommendations for frequency of eye exams.  Eat a healthy diet. Foods like vegetables, fruits, whole grains, low-fat dairy products, and lean protein foods contain the nutrients you need without too many calories. Decrease your intake of foods high in solid fats, added sugars, and salt. Eat the right amount of calories for you.Get information about a proper diet from your caregiver, if necessary.  Regular physical exercise is one of the most important things you can do for your health. Most adults should get at least 150 minutes of moderate-intensity exercise (any activity that increases your heart rate and causes you to sweat) each week. In addition, most adults need muscle-strengthening exercises on 2 or more days a week.  Maintain a healthy weight. The body mass index (BMI) is a screening tool to identify possible weight problems. It provides an estimate of body fat based on height and weight. Your caregiver can help determine your BMI, and can help you achieve or maintain a healthy weight.For adults 20 years and older:  A BMI below 18.5 is considered underweight.  A BMI of 18.5 to 24.9 is normal.  A BMI of 25 to 29.9 is considered overweight.  A BMI of 30 and above is  considered obese.  Maintain normal blood lipids and cholesterol levels by exercising and minimizing your intake of saturated fat. Eat a balanced diet with plenty of fruit and vegetables. Blood tests for lipids and cholesterol should begin at age 20 and be repeated every 5 years. If your lipid or cholesterol levels are high, you are over 50, or you are a high risk for heart disease, you may need your cholesterol levels checked more frequently.Ongoing high lipid and cholesterol levels should be treated with medicines if diet and exercise are not effective.  If you smoke, find out from your caregiver how to quit. If you do not use tobacco, do not start.  If you choose to drink alcohol, do not exceed 2 drinks per day. One drink is considered to be 12 ounces (355 mL) of beer, 5 ounces (148 mL) of wine, or 1.5 ounces (44 mL) of liquor.  Avoid use of street drugs. Do not share needles with anyone. Ask for help if you need support or instructions about stopping the use of drugs.  High blood pressure causes heart disease and increases the risk of stroke. Your blood pressure should be checked at least every 1 to 2 years. Ongoing high blood pressure should be treated with medicines, if weight loss and exercise are not effective.  If you are 45 to 68 years old, ask your caregiver if you should take aspirin to prevent heart disease.  Diabetes screening involves taking a blood sample to check your fasting blood sugar level. This should be done once every 3 years,   after age 45, if you are within normal weight and without risk factors for diabetes. Testing should be considered at a younger age or be carried out more frequently if you are overweight and have at least 1 risk factor for diabetes.  Colorectal cancer can be detected and often prevented. Most routine colorectal cancer screening begins at the age of 50 and continues through age 75. However, your caregiver may recommend screening at an earlier age if you  have risk factors for colon cancer. On a yearly basis, your caregiver may provide home test kits to check for hidden blood in the stool. Use of a small camera at the end of a tube, to directly examine the colon (sigmoidoscopy or colonoscopy), can detect the earliest forms of colorectal cancer. Talk to your caregiver about this at age 50, when routine screening begins. Direct examination of the colon should be repeated every 5 to 10 years through age 75, unless early forms of pre-cancerous polyps or small growths are found.  Hepatitis C blood testing is recommended for all people born from 1945 through 1965 and any individual with known risks for hepatitis C.  Practice safe sex. Use condoms and avoid high-risk sexual practices to reduce the spread of sexually transmitted infections (STIs). STIs include gonorrhea, chlamydia, syphilis, trichomonas, herpes, HPV, and human immunodeficiency virus (HIV). Herpes, HIV, and HPV are viral illnesses that have no cure. They can result in disability, cancer, and death.  A one-time screening for abdominal aortic aneurysm (AAA) and surgical repair of large AAAs by sound wave imaging (ultrasonography) is recommended for ages 65 to 75 years who are current or former smokers.  Healthy men should no longer receive prostate-specific antigen (PSA) blood tests as part of routine cancer screening. Consult with your caregiver about prostate cancer screening.  Testicular cancer screening is not recommended for adult males who have no symptoms. Screening includes self-exam, caregiver exam, and other screening tests. Consult with your caregiver about any symptoms you have or any concerns you have about testicular cancer.  Use sunscreen with skin protection factor (SPF) of 30 or more. Apply sunscreen liberally and repeatedly throughout the day. You should seek shade when your shadow is shorter than you. Protect yourself by wearing long sleeves, pants, a wide-brimmed hat, and  sunglasses year round, whenever you are outdoors.  Once a month, do a whole body skin exam, using a mirror to look at the skin on your back. Notify your caregiver of new moles, moles that have irregular borders, moles that are larger than a pencil eraser, or moles that have changed in shape or color.  Stay current with required immunizations.  Influenza. You need a dose every fall (or winter). The composition of the flu vaccine changes each year, so being vaccinated once is not enough.  Pneumococcal polysaccharide. You need 1 to 2 doses if you smoke cigarettes or if you have certain chronic medical conditions. You need 1 dose at age 65 (or older) if you have never been vaccinated.  Tetanus, diphtheria, pertussis (Tdap, Td). Get 1 dose of Tdap vaccine if you are younger than age 65 years, are over 65 and have contact with an infant, are a healthcare worker, or simply want to be protected from whooping cough. After that, you need a Td booster dose every 10 years. Consult your caregiver if you have not had at least 3 tetanus and diphtheria-containing shots sometime in your life or have a deep or dirty wound.  HPV. This vaccine is recommended   for males 13 through 68 years of age. This vaccine may be given to men 22 through 68 years of age who have not completed the 3 dose series. It is recommended for men through age 26 who have sex with men or whose immune system is weakened because of HIV infection, other illness, or medications. The vaccine is given in 3 doses over 6 months.  Measles, mumps, rubella (MMR). You need at least 1 dose of MMR if you were born in 1957 or later. You may also need a 2nd dose.  Meningococcal. If you are age 19 to 21 years and a first-year college student living in a residence hall, or have one of several medical conditions, you need to get vaccinated against meningococcal disease. You may also need additional booster doses.  Zoster (shingles). If you are age 60 years or  older, you should get this vaccine.  Varicella (chickenpox). If you have never had chickenpox or you were vaccinated but received only 1 dose, talk to your caregiver to find out if you need this vaccine.  Hepatitis A. You need this vaccine if you have a specific risk factor for hepatitis A virus infection, or you simply wish to be protected from this disease. The vaccine is usually given as 2 doses, 6 to 18 months apart.  Hepatitis B. You need this vaccine if you have a specific risk factor for hepatitis B virus infection or you simply wish to be protected from this disease. The vaccine is given in 3 doses, usually over 6 months. Preventative Service / Frequency Ages 19 to 39  Blood pressure check.** / Every 1 to 2 years.  Lipid and cholesterol check.** / Every 5 years beginning at age 20.  Hepatitis C blood test.** / For any individual with known risks for hepatitis C.  Skin self-exam. / Monthly.  Influenza immunization.** / Every year.  Pneumococcal polysaccharide immunization.** / 1 to 2 doses if you smoke cigarettes or if you have certain chronic medical conditions.  Tetanus, diphtheria, pertussis (Tdap,Td) immunization. / A one-time dose of Tdap vaccine. After that, you need a Td booster dose every 10 years.  HPV immunization. / 3 doses over 6 months, if 26 and younger.  Measles, mumps, rubella (MMR) immunization. / You need at least 1 dose of MMR if you were born in 1957 or later. You may also need a 2nd dose.  Meningococcal immunization. / 1 dose if you are age 19 to 21 years and a first-year college student living in a residence hall, or have one of several medical conditions, you need to get vaccinated against meningococcal disease. You may also need additional booster doses.  Varicella immunization.** / Consult your caregiver.  Hepatitis A immunization.** / Consult your caregiver. 2 doses, 6 to 18 months apart.  Hepatitis B immunization.** / Consult your caregiver. 3 doses  usually over 6 months. Ages 40 to 64  Blood pressure check.** / Every 1 to 2 years.  Lipid and cholesterol check.** / Every 5 years beginning at age 20.  Fecal occult blood test (FOBT) of stool. / Every year beginning at age 50 and continuing until age 75. You may not have to do this test if you get colonoscopy every 10 years.  Flexible sigmoidoscopy** or colonoscopy.** / Every 5 years for a flexible sigmoidoscopy or every 10 years for a colonoscopy beginning at age 50 and continuing until age 75.  Hepatitis C blood test.** / For all people born from 1945 through 1965 and any   individual with known risks for hepatitis C.  Skin self-exam. / Monthly.  Influenza immunization.** / Every year.  Pneumococcal polysaccharide immunization.** / 1 to 2 doses if you smoke cigarettes or if you have certain chronic medical conditions.  Tetanus, diphtheria, pertussis (Tdap/Td) immunization.** / A one-time dose of Tdap vaccine. After that, you need a Td booster dose every 10 years.  Measles, mumps, rubella (MMR) immunization. / You need at least 1 dose of MMR if you were born in 1957 or later. You may also need a 2nd dose.  Varicella immunization.**/ Consult your caregiver.  Meningococcal immunization.** / Consult your caregiver.  Hepatitis A immunization.** / Consult your caregiver. 2 doses, 6 to 18 months apart.  Hepatitis B immunization.** / Consult your caregiver. 3 doses, usually over 6 months. Ages 65 and over  Blood pressure check.** / Every 1 to 2 years.  Lipid and cholesterol check.**/ Every 5 years beginning at age 20.  Fecal occult blood test (FOBT) of stool. / Every year beginning at age 50 and continuing until age 75. You may not have to do this test if you get colonoscopy every 10 years.  Flexible sigmoidoscopy** or colonoscopy.** / Every 5 years for a flexible sigmoidoscopy or every 10 years for a colonoscopy beginning at age 50 and continuing until age 75.  Hepatitis C blood  test.** / For all people born from 1945 through 1965 and any individual with known risks for hepatitis C.  Abdominal aortic aneurysm (AAA) screening.** / A one-time screening for ages 65 to 75 years who are current or former smokers.  Skin self-exam. / Monthly.  Influenza immunization.** / Every year.  Pneumococcal polysaccharide immunization.** / 1 dose at age 65 (or older) if you have never been vaccinated.  Tetanus, diphtheria, pertussis (Tdap, Td) immunization. / A one-time dose of Tdap vaccine if you are over 65 and have contact with an infant, are a healthcare worker, or simply want to be protected from whooping cough. After that, you need a Td booster dose every 10 years.  Varicella immunization. ** / Consult your caregiver.  Meningococcal immunization.** / Consult your caregiver.  Hepatitis A immunization. ** / Consult your caregiver. 2 doses, 6 to 18 months apart.  Hepatitis B immunization.** / Check with your caregiver. 3 doses, usually over 6 months. **Family history and personal history of risk and conditions may change your caregiver's recommendations. Document Released: 02/17/2001 Document Revised: 03/16/2011 Document Reviewed: 05/19/2010 [employer] Patient Information 2013 [employer].  

## 2011-11-04 NOTE — Assessment & Plan Note (Signed)
Stable con't meds 

## 2011-11-04 NOTE — Progress Notes (Signed)
Subjective:    Patient ID: Antonio Stephens, male    DOB: 1945-09-08, 66 y.o.   MRN: [id number]  HPI Pt here for cpe and labs.  No complaints.   Review of Systems    Review of Systems  Constitutional: Negative for activity change, appetite change and fatigue.  HENT: Negative for hearing loss, congestion, tinnitus and ear discharge.  dentist q44m Eyes: Negative for visual disturbance (see optho q1y -- vision corrected to 20/20 with glasses).  Respiratory: Negative for cough, chest tightness and shortness of breath.   Cardiovascular: Negative for chest pain, palpitations and leg swelling.  Gastrointestinal: Negative for abdominal pain, diarrhea, constipation and abdominal distention.  Genitourinary: Negative for urgency, frequency, decreased urine volume and difficulty urinating.  Musculoskeletal: Negative for back pain, arthralgias and gait problem.  Skin: Negative for color change, pallor and rash.  Neurological: Negative for dizziness, light-headedness, numbness and headaches.  Hematological: Negative for adenopathy. Does not bruise/bleed easily.  Psychiatric/Behavioral: Negative for suicidal ideas, confusion, sleep disturbance, self-injury, dysphoric mood, decreased concentration and agitation.     Past Medical History  Diagnosis Date  . Hyperlipidemia   . 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 Depression   . Back pain   . Hypertension    History  Substance Use Topics  . Smoking status: Former Smoker -- .2 years    Types: Cigarettes    Quit date: 09/15/1967  . Smokeless tobacco: Not on file  . Alcohol Use: Not on file   Family History  Problem Relation Age of Onset  . Alzheimer's disease Mother 26  . Cancer Father 66    sm cell carcinoma lung  . Cancer Sister 97    breast  . Hyperlipidemia Paternal Grandfather    Current Outpatient Prescriptions on File Prior to Visit  Medication Sig  Dispense Refill  . alendronate (FOSAMAX) 70 MG tablet Take 70 mg by mouth every 7 (seven) days. Take with a full glass of water on an empty stomach.       Marland Kitchen aspirin 325 MG EC tablet Take 325 mg by mouth daily.        . Calcium Carbonate-Vit D-Min (CALCIUM 1200 PO) Take 1 tablet by mouth 2 (two) times daily.        . cholecalciferol (VITAMIN D) 1000 UNITS tablet Take 1,000 Units by mouth 2 (two) times daily.        . Glucosamine-Chondroit-Vit C-Mn (GLUCOSAMINE CHONDR 1500 COMPLX PO) Take 1 tablet by mouth 2 (two) times daily.       Marland Kitchen lisinopril (PRINIVIL,ZESTRIL) 20 MG tablet 1 tab by mouth daily---office visit due now  30 tablet  0  . LOVAZA 1 G capsule TAKE 2 CAPSULES TWICE A DAY  120 capsule  6  . multivitamin (THERAGRAN) per tablet Take 1 tablet by mouth daily.        . sertraline (ZOLOFT) 100 MG tablet Take 100 mg by mouth daily.        Marland Kitchen DISCONTD: rosuvastatin (CRESTOR) 20 MG tablet Take 1 tablet (20 mg total) by mouth daily.  30 tablet  0   Past Surgical History  Procedure Date  . Knee arthroscopy 04/2005    right  . Inguinal herniorrhaphy   . 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   . Total hip arthroplasty 12/16/09  . Knee surgery 09-2009    partial replacemente , R   Family History  Problem Relation  Age of Onset  . Alzheimer's disease Mother 55  . Cancer Father 66    sm cell carcinoma lung  . Cancer Sister 20    breast  . Hyperlipidemia Paternal Grandfather     Objective:   Physical Exam  BP 122/66  Pulse 70  Temp 98.6 F (37 C) (Oral)  Wt 193 lb 9.6 oz (87.816 kg)  SpO2 95%  General Appearance:    Alert, cooperative, no distress, appears stated age  Head:    Normocephalic, without obvious abnormality, atraumatic  Eyes:    PERRL, conjunctiva/corneas clear, EOM's intact, fundi    benign, both eyes       Ears:    Normal TM's and external ear canals, both ears  Nose:   Nares normal, septum midline, mucosa normal, no drainage   or sinus tenderness  Throat:   Lips, mucosa, and  tongue normal; teeth and gums normal  Neck:   Supple, symmetrical, trachea midline, no adenopathy;       thyroid:  No enlargement/tenderness/nodules; no carotid   bruit or JVD  Back:     Symmetric, no curvature, ROM normal, no CVA tenderness  Lungs:     Clear to auscultation bilaterally, respirations unlabored  Chest wall:    No tenderness or deformity  Heart:    Regular rate and rhythm, S1 and S2 normal, no murmur, rub   or gallop  Abdomen:     Soft, non-tender, bowel sounds active all four quadrants,    no masses, no organomegaly  Genitalia:    Normal male without lesion, discharge or tenderness  Rectal:    Normal tone, normal prostate, no masses or tenderness;   guaiac negative stool  Extremities:   Extremities normal, atraumatic, no cyanosis or edema  Pulses:   2+ and symmetric all extremities  Skin:   Skin color, texture, turgor normal, no rashes or lesions  Lymph nodes:   Cervical, supraclavicular, and axillary nodes normal  Neurologic:   CNII-XII intact. Normal strength, sensation and reflexes      throughout        Assessment & Plan:  cpe--  Check labs             ghm utd

## 2011-11-05 ENCOUNTER — Other Ambulatory Visit (INDEPENDENT_AMBULATORY_CARE_PROVIDER_SITE_OTHER): Payer: Managed Care, Other (non HMO)

## 2011-11-05 DIAGNOSIS — I10 Essential (primary) hypertension: Secondary | ICD-10-CM

## 2011-11-05 DIAGNOSIS — E785 Hyperlipidemia, unspecified: Secondary | ICD-10-CM

## 2011-11-05 LAB — CBC WITH DIFFERENTIAL/PLATELET
Basophils Absolute: 0 10*3/uL (ref 0.0–0.1)
Eosinophils Absolute: 0.2 10*3/uL (ref 0.0–0.7)
Hemoglobin: 14.6 g/dL (ref 13.0–17.0)
Lymphocytes Relative: 29.5 % (ref 12.0–46.0)
MCHC: 32.8 g/dL (ref 30.0–36.0)
Neutro Abs: 4.6 10*3/uL (ref 1.4–7.7)
Platelets: 211 10*3/uL (ref 150.0–400.0)
RDW: 14.6 % (ref 11.5–14.6)

## 2011-11-05 LAB — BASIC METABOLIC PANEL
BUN: 28 mg/dL — ABNORMAL HIGH (ref 6–23)
CO2: 27 mEq/L (ref 19–32)
Calcium: 8.9 mg/dL (ref 8.4–10.5)
Creatinine, Ser: 1.2 mg/dL (ref 0.4–1.5)
Glucose, Bld: 92 mg/dL (ref 70–99)
Sodium: 138 mEq/L (ref 135–145)

## 2011-11-05 LAB — LIPID PANEL
Total CHOL/HDL Ratio: 3
Triglycerides: 93 mg/dL (ref 0.0–149.0)

## 2011-11-05 LAB — HEPATIC FUNCTION PANEL
AST: 26 U/L (ref 0–37)
Alkaline Phosphatase: 51 U/L (ref 39–117)
Total Bilirubin: 0.7 mg/dL (ref 0.3–1.2)

## 2011-11-12 ENCOUNTER — Telehealth: Payer: Self-pay

## 2011-11-12 NOTE — Telephone Encounter (Signed)
After reviewing pt paper chart I seen he was scheduled for colonoscopy 05/21/03 but I didn't see any results. I called and advised pt of this info and gave him [phone] the number of the  GI. Pt stated understanding. I also gave pt lab results and mailed them.     MW

## 2011-11-12 NOTE — Telephone Encounter (Signed)
See labs See colon-- I believe it was 2005

## 2011-11-12 NOTE — Telephone Encounter (Signed)
Pt wanted lab results from 11/05/11 but not showing resulted. Pt asking when last colonoscopy?  Plz advise     MW  BJ:[phone]

## 2011-11-12 NOTE — Telephone Encounter (Signed)
Mailed pt results.   MW

## 2011-12-10 ENCOUNTER — Encounter: Payer: Managed Care, Other (non HMO) | Admitting: Family Medicine

## 2011-12-14 ENCOUNTER — Other Ambulatory Visit: Payer: Managed Care, Other (non HMO)

## 2011-12-18 ENCOUNTER — Encounter: Payer: Managed Care, Other (non HMO) | Admitting: Family Medicine

## 2011-12-22 ENCOUNTER — Other Ambulatory Visit: Payer: Self-pay | Admitting: Family Medicine

## 2012-04-20 ENCOUNTER — Telehealth: Payer: Self-pay | Admitting: Family Medicine

## 2012-04-20 MED ORDER — ROSUVASTATIN CALCIUM 20 MG PO TABS: ORAL_TABLET | ORAL | Status: DC

## 2012-04-20 NOTE — Telephone Encounter (Signed)
CRESTOR 20 MG TABLETS QTY: 3 LAST REFILL: 4.7.14 TK 1 T PO D

## 2012-05-04 ENCOUNTER — Encounter: Payer: Self-pay | Admitting: Family Medicine

## 2012-05-04 ENCOUNTER — Ambulatory Visit (INDEPENDENT_AMBULATORY_CARE_PROVIDER_SITE_OTHER): Payer: Medicare HMO | Admitting: Family Medicine

## 2012-05-04 VITALS — BP 120/70 | HR 76 | Temp 98.0°F | Wt 191.8 lb

## 2012-05-04 DIAGNOSIS — F329 Major depressive disorder, single episode, unspecified: Secondary | ICD-10-CM

## 2012-05-04 DIAGNOSIS — R252 Cramp and spasm: Secondary | ICD-10-CM

## 2012-05-04 DIAGNOSIS — F32A Depression, unspecified: Secondary | ICD-10-CM

## 2012-05-04 DIAGNOSIS — Z8679 Personal history of other diseases of the circulatory system: Secondary | ICD-10-CM

## 2012-05-04 DIAGNOSIS — E785 Hyperlipidemia, unspecified: Secondary | ICD-10-CM

## 2012-05-04 DIAGNOSIS — I10 Essential (primary) hypertension: Secondary | ICD-10-CM

## 2012-05-04 LAB — BASIC METABOLIC PANEL
CO2: 31 mEq/L (ref 19–32)
Chloride: 103 mEq/L (ref 96–112)
Sodium: 138 mEq/L (ref 135–145)

## 2012-05-04 LAB — HEPATIC FUNCTION PANEL
ALT: 19 U/L (ref 0–53)
Alkaline Phosphatase: 44 U/L (ref 39–117)
Bilirubin, Direct: 0.1 mg/dL (ref 0.0–0.3)
Total Protein: 6.6 g/dL (ref 6.0–8.3)

## 2012-05-04 LAB — LIPID PANEL
HDL: 55.7 mg/dL (ref >39.00)
Total CHOL/HDL Ratio: 3

## 2012-05-04 LAB — MAGNESIUM: Magnesium: 2.2 mg/dL (ref 1.5–2.5)

## 2012-05-04 MED ORDER — LISINOPRIL 20 MG PO TABS: ORAL_TABLET | ORAL | Status: DC

## 2012-05-04 MED ORDER — ROSUVASTATIN CALCIUM 20 MG PO TABS: ORAL_TABLET | ORAL | Status: DC

## 2012-05-04 NOTE — Patient Instructions (Addendum)

## 2012-05-04 NOTE — Progress Notes (Signed)
  Subjective:    Patient here for follow-up of elevated blood pressure.  He is exercising and is adherent to a low-salt diet.  Blood pressure is well controlled at home. Cardiac symptoms: none. Patient denies: chest pain, chest pressure/discomfort, claudication, dyspnea, exertional chest pressure/discomfort, fatigue, irregular heart beat, lower extremity edema, near-syncope, orthopnea, palpitations, paroxysmal nocturnal dyspnea, syncope and tachypnea. Cardiovascular risk factors: advanced age (older than 68 for men, 56 for women), dyslipidemia, hypertension and male gender. Use of agents associated with hypertension: none. History of target organ damage: stroke.  The following portions of the patient's history were reviewed and updated as appropriate: allergies, current medications, past family history, past medical history, past social history, past surgical history and problem list.  Review of Systems Pertinent items are noted in HPI.     Objective:    BP 120/70  Pulse 76  Temp(Src) 98 F (36.7 C) (Oral)  Wt 191 lb 12.8 oz (87 kg)  BMI 26.01 kg/m2  SpO2 97% General appearance: alert, cooperative, appears stated age and no distress Lungs: clear to auscultation bilaterally Heart: S1, S2 normal Extremities: extremities normal, atraumatic, no cyanosis or edema    Assessment:    Hypertension, normal blood pressure . Evidence of target organ damage: stroke.    Plan:    Medication: no change. Dietary sodium restriction. Regular aerobic exercise. Check blood pressures 2-3 times weekly and record. Follow up: 6 months and as needed.

## 2012-05-05 NOTE — Assessment & Plan Note (Signed)
con't meds  Check labs 

## 2012-05-05 NOTE — Assessment & Plan Note (Signed)
Cont meds   

## 2012-05-25 ENCOUNTER — Ambulatory Visit (INDEPENDENT_AMBULATORY_CARE_PROVIDER_SITE_OTHER): Payer: Self-pay | Admitting: *Deleted

## 2012-05-25 DIAGNOSIS — Z8679 Personal history of other diseases of the circulatory system: Secondary | ICD-10-CM

## 2012-05-25 NOTE — Progress Notes (Signed)
Participant in the office today for IRIS 3rd. Annual Visit. MMSE was successfully completed.  Blood was drawn and shipped. Participant to continue with follow-up per IRIS protocol.

## 2012-12-31 ENCOUNTER — Other Ambulatory Visit: Payer: Self-pay | Admitting: Family Medicine

## 2013-01-02 ENCOUNTER — Other Ambulatory Visit: Payer: Self-pay | Admitting: Family Medicine

## 2013-01-29 ENCOUNTER — Other Ambulatory Visit: Payer: Self-pay | Admitting: Family Medicine

## 2013-01-31 ENCOUNTER — Telehealth: Payer: Self-pay

## 2013-01-31 NOTE — Telephone Encounter (Signed)
Left a message for call back. Identifiable.  Flu-due Tdap- 09/05/09 PNA- 09/05/09 Shingles- 12/10/10 CCS- 05/21/03 PSA- 11/05/11- 3.76

## 2013-02-02 ENCOUNTER — Encounter: Payer: Self-pay | Admitting: Family Medicine

## 2013-02-02 ENCOUNTER — Ambulatory Visit (INDEPENDENT_AMBULATORY_CARE_PROVIDER_SITE_OTHER): Payer: Medicare PPO | Admitting: Family Medicine

## 2013-02-02 VITALS — BP 110/74 | HR 84 | Temp 98.6°F | Ht 72.0 in | Wt 196.0 lb

## 2013-02-02 DIAGNOSIS — Z8679 Personal history of other diseases of the circulatory system: Secondary | ICD-10-CM

## 2013-02-02 DIAGNOSIS — E785 Hyperlipidemia, unspecified: Secondary | ICD-10-CM

## 2013-02-02 DIAGNOSIS — Z0000 Encounter for general adult medical examination without abnormal findings: Secondary | ICD-10-CM

## 2013-02-02 DIAGNOSIS — D171 Benign lipomatous neoplasm of skin and subcutaneous tissue of trunk: Secondary | ICD-10-CM

## 2013-02-02 DIAGNOSIS — I10 Essential (primary) hypertension: Secondary | ICD-10-CM

## 2013-02-02 DIAGNOSIS — Z125 Encounter for screening for malignant neoplasm of prostate: Secondary | ICD-10-CM

## 2013-02-02 DIAGNOSIS — D1779 Benign lipomatous neoplasm of other sites: Secondary | ICD-10-CM

## 2013-02-02 NOTE — Assessment & Plan Note (Signed)
Check labs con't meds 

## 2013-02-02 NOTE — Assessment & Plan Note (Signed)
Check lab con't meds See avs

## 2013-02-02 NOTE — Assessment & Plan Note (Signed)
con't meds No new symptoms

## 2013-02-02 NOTE — Progress Notes (Signed)
Subjective:    Antonio Stephens is a 68 y.o. male who presents for Medicare Annual/Subsequent preventive examination.   Preventive Screening-Counseling & Management  Tobacco History  Smoking status  . Former Smoker -- .2 years  . Types: Cigarettes  . Quit date: 09/15/1967  Smokeless tobacco  . Not on file    Problems Prior to Visit 1. Lipoma -- R side under scapula  Current Problems (verified) Patient Active Problem List   Diagnosis Date Noted  . Onychomycosis 10/16/2011  . LOC OSTEOARTHROS NOT SPEC PRIM/SEC PELV RGN&THI 12/10/2009  . HYPERGLYCEMIA, FASTING 12/10/2009  . FREQUENCY, URINARY 09/05/2009  . Unspecified essential hypertension 07/19/2009  . DEGENERATIVE JOINT DISEASE, ADVANCED 07/19/2009  . DEPRESSION, HX OF 02/05/2009  . DIASTOLIC DYSFUNCTION 0000000  . CEREBROVASCULAR ACCIDENT, HX OF 12/25/2008  . HYPERLIPIDEMIA 11/18/2006  . BACK PAIN 10/14/2006    Medications Prior to Visit Current Outpatient Prescriptions on File Prior to Visit  Medication Sig Dispense Refill  . aspirin 325 MG EC tablet Take 325 mg by mouth daily.        . Calcium Carbonate-Vit D-Min (CALCIUM 1200 PO) Take 1 tablet by mouth 2 (two) times daily.        . cholecalciferol (VITAMIN D) 1000 UNITS tablet Take 1,000 Units by mouth 2 (two) times daily.        . CRESTOR 20 MG tablet TAKE 1 TABLET BY MOUTH EVERY DAY  30 tablet  0  . DENAVIR 1 % cream Apply 1 application topically. As needed for cold sores      . Glucosamine-Chondroit-Vit C-Mn (GLUCOSAMINE CHONDR 1500 COMPLX PO) Take 1 tablet by mouth 2 (two) times daily.       Marland Kitchen lisinopril (PRINIVIL,ZESTRIL) 20 MG tablet 1 tab by mouth daily---office visit due now  30 tablet  0  . multivitamin (THERAGRAN) per tablet Take 1 tablet by mouth daily.        . sertraline (ZOLOFT) 100 MG tablet Take 100 mg by mouth daily.        . Turmeric 500 MG CAPS Take 1 capsule by mouth daily.       No current facility-administered medications on file prior  to visit.    Current Medications (verified) Current Outpatient Prescriptions  Medication Sig Dispense Refill  . aspirin 325 MG EC tablet Take 325 mg by mouth daily.        . Calcium Carbonate-Vit D-Min (CALCIUM 1200 PO) Take 1 tablet by mouth 2 (two) times daily.        . cholecalciferol (VITAMIN D) 1000 UNITS tablet Take 1,000 Units by mouth 2 (two) times daily.        . CRESTOR 20 MG tablet TAKE 1 TABLET BY MOUTH EVERY DAY  30 tablet  0  . DENAVIR 1 % cream Apply 1 application topically. As needed for cold sores      . Garlic-Calcium (BL GARLIC PO) Take 99991111 mg by mouth daily.      . Glucosamine-Chondroit-Vit C-Mn (GLUCOSAMINE CHONDR 1500 COMPLX PO) Take 1 tablet by mouth 2 (two) times daily.       Marland Kitchen lisinopril (PRINIVIL,ZESTRIL) 20 MG tablet 1 tab by mouth daily---office visit due now  30 tablet  0  . multivitamin (THERAGRAN) per tablet Take 1 tablet by mouth daily.        . Omega 3 1000 MG CAPS Take 1 capsule by mouth 2 (two) times daily.      . sertraline (ZOLOFT) 100 MG tablet Take 100 mg by  mouth daily.        . Turmeric 500 MG CAPS Take 1 capsule by mouth daily.       No current facility-administered medications for this visit.     Allergies (verified) Review of patient's allergies indicates no known allergies.   PAST HISTORY  Family History Family History  Problem Relation Age of Onset  . Alzheimer's disease Mother 37  . Cancer Father 14    sm cell carcinoma lung  . Cancer Sister 73    breast  . Hyperlipidemia Paternal Grandfather     Social History History  Substance Use Topics  . Smoking status: Former Smoker -- .2 years    Types: Cigarettes    Quit date: 09/15/1967  . Smokeless tobacco: Not on file  . Alcohol Use: Not on file    Are there smokers in your home (other than you)?  No  Risk Factors Current exercise habits: Gym/ health club routine includes trainer.  Dietary issues discussed: na   Cardiac risk factors: advanced age (older than 76 for men,  6 for women), dyslipidemia, hypertension and male gender.  Depression Screen (Note: if answer to either of the following is "Yes", a more complete depression screening is indicated)   Q1: Over the past two weeks, have you felt down, depressed or hopeless? No  Q2: Over the past two weeks, have you felt [place] interest or pleasure in doing things? No  Have you lost interest or pleasure in daily life? No  Do you often feel hopeless? No  Do you cry easily over simple problems? No  Activities of Daily Living In your present state of health, do you have any difficulty performing the following activities?:  Driving? No Managing money?  No Feeding yourself? No Getting from bed to chair? No Climbing a flight of stairs? No Preparing food and eating?: No Bathing or showering? No Getting dressed: No Getting to the toilet? No Using the toilet:No Moving around from place to place: No In the past year have you fallen or had a near fall?:No   Are you sexually active?  No  Do you have more than one partner?  No  Hearing Difficulties: No Do you often ask people to speak up or repeat themselves? No Do you experience ringing or noises in your ears? No Do you have difficulty understanding soft or whispered voices? No   Do you feel that you have a problem with memory? No  Do you often misplace items? No  Do you feel safe at home?  Yes  Cognitive Testing  Alert? Yes  Normal Appearance?Yes  Oriented to person? Yes  Place? Yes   Time? Yes  Recall of three objects?  Yes  Can perform simple calculations? Yes  Displays appropriate judgment?Yes  Can read the correct time from a watch face?Yes   Advanced Directives have been discussed with the patient? Yes   List the Names of Other Physician/Practitioners you currently use: 1.  oph-- beavis 2  Dentist-- [facility]kerson 3. OrthoAlmetta Lovely, [place]  Indicate any recent Medical Services you may have received from other than [facility] providers in the  past year (date may be approximate).  Immunization History  Administered Date(s) Administered  . Pneumococcal Polysaccharide-23 09/05/2009  . Td 09/05/2009  . Zoster 12/10/2010    Screening Tests Health Maintenance  Topic Date Due  . Influenza Vaccine  02/02/2014  . Colonoscopy  05/20/2013  . Tetanus/tdap  09/06/2019  . Pneumococcal Polysaccharide Vaccine Age 7 And Over  Completed  .  Zostavax  Completed    All answers were reviewed with the patient and necessary referrals were made:  Garnet Koyanagi, DO   02/02/2013   History reviewed:  He  has a past medical history of Hyperlipidemia; CVA (cerebral vascular accident); Depression; Back pain; and Hypertension. He  does not have any pertinent problems on file. He  has past surgical history that includes Knee arthroscopy (04/2005); inguinal Herniorrhaphy; 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; Total hip arthroplasty (12/16/09); and Knee surgery (09-2009). His family history includes Alzheimer's disease (age of onset: 91) in his mother; Cancer (age of onset: 83) in his sister; Cancer (age of onset: 74) in his father; Hyperlipidemia in his paternal grandfather. He  reports that he quit smoking about 45 years ago. His smoking use included Cigarettes. He smoked 0.00 packs per day for .2 years. He does not have any smokeless tobacco history on file. He reports that he does not use illicit drugs. His alcohol history is not on file. He has a current medication list which includes the following prescription(s): aspirin, calcium carbonate-vit d-min, cholecalciferol, crestor, denavir, garlic-calcium, glucosamine-chondroit-vit c-mn, lisinopril, multivitamin, omega 3, sertraline, and turmeric. Current Outpatient Prescriptions on File Prior to Visit  Medication Sig Dispense Refill  . aspirin 325 MG EC tablet Take 325 mg by mouth daily.        . Calcium Carbonate-Vit D-Min (CALCIUM 1200 PO) Take 1 tablet by mouth 2 (two) times daily.        . cholecalciferol (VITAMIN  D) 1000 UNITS tablet Take 1,000 Units by mouth 2 (two) times daily.        . CRESTOR 20 MG tablet TAKE 1 TABLET BY MOUTH EVERY DAY  30 tablet  0  . DENAVIR 1 % cream Apply 1 application topically. As needed for cold sores      . Glucosamine-Chondroit-Vit C-Mn (GLUCOSAMINE CHONDR 1500 COMPLX PO) Take 1 tablet by mouth 2 (two) times daily.       Marland Kitchen lisinopril (PRINIVIL,ZESTRIL) 20 MG tablet 1 tab by mouth daily---office visit due now  30 tablet  0  . multivitamin (THERAGRAN) per tablet Take 1 tablet by mouth daily.        . sertraline (ZOLOFT) 100 MG tablet Take 100 mg by mouth daily.        . Turmeric 500 MG CAPS Take 1 capsule by mouth daily.       No current facility-administered medications on file prior to visit.   He has No Known Allergies.  Review of Systems  Review of Systems  Constitutional: Negative for activity change, appetite change and fatigue.  HENT: Negative for hearing loss, congestion, tinnitus and ear discharge.   Eyes: Negative for visual disturbance (see optho q1y -- vision corrected to 20/20 with glasses).  Respiratory: Negative for cough, chest tightness and shortness of breath.   Cardiovascular: Negative for chest pain, palpitations and leg swelling.  Gastrointestinal: Negative for abdominal pain, diarrhea, constipation and abdominal distention.  Genitourinary: Negative for urgency, frequency, decreased urine volume and difficulty urinating.  Musculoskeletal: Negative for back pain, arthralgias and gait problem.  Skin: Negative for color change, pallor and rash.  Neurological: Negative for dizziness, light-headedness, numbness and headaches.  Hematological: Negative for adenopathy. Does not bruise/bleed easily.  Psychiatric/Behavioral: Negative for suicidal ideas, confusion, sleep disturbance, self-injury, dysphoric mood, decreased concentration and agitation.  Pt is able to read and write and can do all ADLs No risk for falling No abuse/ violence in home      Objective:  Vision by Snellen chart: opth Blood pressure 110/74, pulse 84, temperature 98.6 F (37 C), temperature source Oral, height 6' (1.829 m), weight 196 lb (88.905 kg), SpO2 95.00%. Body mass index is 26.58 kg/(m^2).  BP 110/74  Pulse 84  Temp(Src) 98.6 F (37 C) (Oral)  Ht 6' (1.829 m)  Wt 196 lb (88.905 kg)  BMI 26.58 kg/m2  SpO2 95% General appearance: alert, cooperative, appears stated age and no distress Head: Normocephalic, without obvious abnormality, atraumatic Eyes: conjunctivae/corneas clear. PERRL, EOM's intact. Fundi benign. Ears: normal TM's and external ear canals both ears Nose: Nares normal. Septum midline. Mucosa normal. No drainage or sinus tenderness. Throat: lips, mucosa, and tongue normal; teeth and gums normal Neck: no adenopathy, no carotid bruit, no JVD, supple, symmetrical, trachea midline and thyroid not enlarged, symmetric, no tenderness/mass/nodules Back: symmetric, no curvature. ROM normal. No CVA tenderness. Lungs: clear to auscultation bilaterally Chest wall: no tenderness Heart: regular rate and rhythm, S1, S2 normal, no murmur, click, rub or gallop Abdomen: soft, non-tender; bowel sounds normal; no masses,  no organomegaly Male genitalia: normal, penis: no lesions or discharge. testes: no masses or tenderness. no hernias Rectal: normal tone, normal prostate, no masses or tenderness and soft brown guaiac negative stool noted Extremities: extremities normal, atraumatic, no cyanosis or edema Pulses: 2+ and symmetric Skin: Skin color, texture, turgor normal. No rashes or lesions Lymph nodes: Cervical, supraclavicular, and axillary nodes normal. Neurologic: Alert and oriented X 3, normal strength and tone. Normal symmetric reflexes. Normal coordination and gait  psych--no depression, no anxiety   Assessment:     cpe      Plan:    check labs See AVS During the course of the visit the patient was educated and counseled about  appropriate screening and preventive services including:    Pneumococcal vaccine   Influenza vaccine  Td vaccine  Prostate cancer screening  Colorectal cancer screening  Diabetes screening  Glaucoma screening  Advanced directives: has an advanced directive - a copy HAS NOT been provided.  Diet review for nutrition referral? Yes ____  Not Indicated ___x_   Patient Instructions (the written plan) was given to the patient.  Medicare Attestation I have personally reviewed: The patient's medical and social history Their use of alcohol, tobacco or illicit drugs Their current medications and supplements The patient's functional ability including ADLs,fall risks, home safety risks, cognitive, and hearing and visual impairment Diet and physical activities Evidence for depression or mood disorders  The patient's weight, height, BMI, and visual acuity have been recorded in the chart.  I have made referrals, counseling, and provided education to the patient based on review of the above and I have provided the patient with a written personalized care plan for preventive services.     Garnet Koyanagi, DO   02/02/2013

## 2013-02-02 NOTE — Progress Notes (Signed)
Pre visit review using our clinic review tool, if applicable. No additional management support is needed unless otherwise documented below in the visit note. 

## 2013-02-02 NOTE — Patient Instructions (Signed)
Preventive Care for Adults, Male A healthy lifestyle and preventive care can promote health and wellness. Preventive health guidelines for men include the following key practices:  A routine yearly physical is a good way to check with your health care provider about your health and preventative screening. It is a chance to share any concerns and updates on your health and to receive a thorough exam.  Visit your dentist for a routine exam and preventative care every 6 months. Brush your teeth twice a day and floss once a day. Good oral hygiene prevents tooth decay and gum disease.  The frequency of eye exams is based on your age, health, family medical history, use of contact lenses, and other factors. Follow your health care provider's recommendations for frequency of eye exams.  Eat a healthy diet. Foods such as vegetables, fruits, whole grains, low-fat dairy products, and lean protein foods contain the nutrients you need without too many calories. Decrease your intake of foods high in solid fats, added sugars, and salt. Eat the right amount of calories for you.Get information about a proper diet from your health care provider, if necessary.  Regular physical exercise is one of the most important things you can do for your health. Most adults should get at least 150 minutes of moderate-intensity exercise (any activity that increases your heart rate and causes you to sweat) each week. In addition, most adults need muscle-strengthening exercises on 2 or more days a week.  Maintain a healthy weight. The body mass index (BMI) is a screening tool to identify possible weight problems. It provides an estimate of body fat based on height and weight. Your health care provider can find your BMI and can help you achieve or maintain a healthy weight.For adults 20 years and older:  A BMI below 18.5 is considered underweight.  A BMI of 18.5 to 24.9 is normal.  A BMI of 25 to 29.9 is considered  overweight.  A BMI of 30 and above is considered obese.  Maintain normal blood lipids and cholesterol levels by exercising and minimizing your intake of saturated fat. Eat a balanced diet with plenty of fruit and vegetables. Blood tests for lipids and cholesterol should begin at age 42 and be repeated every 5 years. If your lipid or cholesterol levels are high, you are over 50, or you are at high risk for heart disease, you may need your cholesterol levels checked more frequently.Ongoing high lipid and cholesterol levels should be treated with medicines if diet and exercise are not working.  If you smoke, find out from your health care provider how to quit. If you do not use tobacco, do not start.  Lung cancer screening is recommended for adults aged 24 80 years who are at high risk for developing lung cancer because of a history of smoking. A yearly low-dose CT scan of the lungs is recommended for people who have at least a 30-pack-year history of smoking and are a current smoker or have quit within the past 15 years. A pack year of smoking is smoking an average of 1 pack of cigarettes a day for 1 year (for example: 1 pack a day for 30 years or 2 packs a day for 15 years). Yearly screening should continue until the smoker has stopped smoking for at least 15 years. Yearly screening should be stopped for people who develop a health problem that would prevent them from having lung cancer treatment.  If you choose to drink alcohol, do not have  more than 2 drinks per day. One drink is considered to be 12 ounces (355 mL) of beer, 5 ounces (148 mL) of wine, or 1.5 ounces (44 mL) of liquor.  Avoid use of street drugs. Do not share needles with anyone. Ask for help if you need support or instructions about stopping the use of drugs.  High blood pressure causes heart disease and increases the risk of stroke. Your blood pressure should be checked at least every 1 2 years. Ongoing high blood pressure should be  treated with medicines, if weight loss and exercise are not effective.  If you are 75 70 years old, ask your health care provider if you should take aspirin to prevent heart disease.  Diabetes screening involves taking a blood sample to check your fasting blood sugar level. This should be done once every 3 years, after age 19, if you are within normal weight and without risk factors for diabetes. Testing should be considered at a younger age or be carried out more frequently if you are overweight and have at least 1 risk factor for diabetes.  Colorectal cancer can be detected and often prevented. Most routine colorectal cancer screening begins at the age of 47 and continues through age 80. However, your health care provider may recommend screening at an earlier age if you have risk factors for colon cancer. On a yearly basis, your health care provider may provide home test kits to check for hidden blood in the stool. Use of a small camera at the end of a tube to directly examine the colon (sigmoidoscopy or colonoscopy) can detect the earliest forms of colorectal cancer. Talk to your health care provider about this at age 66, when routine screening begins. Direct exam of the colon should be repeated every 5 10 years through age 19, unless early forms of precancerous polyps or small growths are found.  People who are at an increased risk for hepatitis B should be screened for this virus. You are considered at high risk for hepatitis B if:  You were born in a country where hepatitis B occurs often. Talk with your health care provider about which countries are considered high-risk.  Your parents were born in a high-risk country and you have not received a shot to protect against hepatitis B (hepatitis B vaccine).  You have HIV or AIDS.  You use needles to inject street drugs.  You live with, or have sex with, someone who has hepatitis B.  You are a man who has sex with other men (MSM).  You get  hemodialysis treatment.  You take certain medicines for conditions such as cancer, organ transplantation, and autoimmune conditions.  Hepatitis C blood testing is recommended for all people born from [birth year] through 1965 and any individual with known risks for hepatitis C.  Practice safe sex. Use condoms and avoid high-risk sexual practices to reduce the spread of sexually transmitted infections (STIs). STIs include gonorrhea, chlamydia, syphilis, trichomonas, herpes, HPV, and human immunodeficiency virus (HIV). Herpes, HIV, and HPV are viral illnesses that have no cure. They can result in disability, cancer, and death.  A one-time screening for abdominal aortic aneurysm (AAA) and surgical repair of large AAAs by ultrasound are recommended for men ages 94 to 74 years who are current or former smokers.  Healthy men should no longer receive prostate-specific antigen (PSA) blood tests as part of routine cancer screening. Talk with your health care provider about prostate cancer screening.  Testicular cancer screening is not recommended  for adult males who have no symptoms. Screening includes self-exam, a health care provider exam, and other screening tests. Consult with your health care provider about any symptoms you have or any concerns you have about testicular cancer.  Use sunscreen. Apply sunscreen liberally and repeatedly throughout the day. You should seek shade when your shadow is shorter than you. Protect yourself by wearing long sleeves, pants, a wide-brimmed hat, and sunglasses year round, whenever you are outdoors.  Once a month, do a whole-body skin exam, using a mirror to look at the skin on your back. Tell your health care provider about new moles, moles that have irregular borders, moles that are larger than a pencil eraser, or moles that have changed in shape or color.  Stay current with required vaccines (immunizations).  Influenza vaccine. All adults should be immunized every  year.  Tetanus, diphtheria, and acellular pertussis (Td, Tdap) vaccine. An adult who has not previously received Tdap or who does not know his vaccine status should receive 1 dose of Tdap. This initial dose should be followed by tetanus and diphtheria toxoids (Td) booster doses every 10 years. Adults with an unknown or incomplete history of completing a 3-dose immunization series with Td-containing vaccines should begin or complete a primary immunization series including a Tdap dose. Adults should receive a Td booster every 10 years.  Varicella vaccine. An adult without evidence of immunity to varicella should receive 2 doses or a second dose if he has previously received 1 dose.  Human papillomavirus (HPV) vaccine. Males aged 44 21 years who have not received the vaccine previously should receive the 3-dose series. Males aged 43 26 years may be immunized. Immunization is recommended through the age of 50 years for any male who has sex with males and did not get any or all doses earlier. Immunization is recommended for any person with an immunocompromised condition through the age of 23 years if he did not get any or all doses earlier. During the 3-dose series, the second dose should be obtained 4 8 weeks after the first dose. The third dose should be obtained 24 weeks after the first dose and 16 weeks after the second dose.  Zoster vaccine. One dose is recommended for adults aged 96 years or older unless certain conditions are present.  Measles, mumps, and rubella (MMR) vaccine. Adults born before 55 generally are considered immune to measles and mumps. Adults born in [birth year] or later should have 1 or more doses of MMR vaccine unless there is a contraindication to the vaccine or there is laboratory evidence of immunity to each of the three diseases. A routine second dose of MMR vaccine should be obtained at least 28 days after the first dose for students attending postsecondary schools, health care  workers, or international travelers. People who received inactivated measles vaccine or an unknown type of measles vaccine during 1963 1967 should receive 2 doses of MMR vaccine. People who received inactivated mumps vaccine or an unknown type of mumps vaccine before 1979 and are at high risk for mumps infection should consider immunization with 2 doses of MMR vaccine. Unvaccinated health care workers born before [birth year] who lack laboratory evidence of measles, mumps, or rubella immunity or laboratory confirmation of disease should consider measles and mumps immunization with 2 doses of MMR vaccine or rubella immunization with 1 dose of MMR vaccine.  Pneumococcal 13-valent conjugate (PCV13) vaccine. When indicated, a person who is uncertain of his immunization history and has no record of immunization  should receive the PCV13 vaccine. An adult aged 67 years or older who has certain medical conditions and has not been previously immunized should receive 1 dose of PCV13 vaccine. This PCV13 should be followed with a dose of pneumococcal polysaccharide (PPSV23) vaccine. The PPSV23 vaccine dose should be obtained at least 8 weeks after the dose of PCV13 vaccine. An adult aged 79 years or older who has certain medical conditions and previously received 1 or more doses of PPSV23 vaccine should receive 1 dose of PCV13. The PCV13 vaccine dose should be obtained 1 or more years after the last PPSV23 vaccine dose.  Pneumococcal polysaccharide (PPSV23) vaccine. When PCV13 is also indicated, PCV13 should be obtained first. All adults aged 74 years and older should be immunized. An adult younger than age 50 years who has certain medical conditions should be immunized. Any person who resides in a nursing home or long-term care facility should be immunized. An adult smoker should be immunized. People with an immunocompromised condition and certain other conditions should receive both PCV13 and PPSV23 vaccines. People with human  immunodeficiency virus (HIV) infection should be immunized as soon as possible after diagnosis. Immunization during chemotherapy or radiation therapy should be avoided. Routine use of PPSV23 vaccine is not recommended for American Indians, [place] Natives, or people younger than 65 years unless there are medical conditions that require PPSV23 vaccine. When indicated, people who have unknown immunization and have no record of immunization should receive PPSV23 vaccine. One-time revaccination 5 years after the first dose of PPSV23 is recommended for people aged 41 64 years who have chronic kidney failure, nephrotic syndrome, asplenia, or immunocompromised conditions. People who received 1 2 doses of PPSV23 before age 15 years should receive another dose of PPSV23 vaccine at age 48 years or later if at least 5 years have passed since the previous dose. Doses of PPSV23 are not needed for people immunized with PPSV23 at or after age 69 years.  Meningococcal vaccine. Adults with asplenia or persistent complement component deficiencies should receive 2 doses of quadrivalent meningococcal conjugate (MenACWY-D) vaccine. The doses should be obtained at least 2 months apart. Microbiologists working with certain meningococcal bacteria, [place] recruits, people at risk during an outbreak, and people who travel to or live in countries with a high rate of meningitis should be immunized. A first-year college student up through age 7 years who is living in a residence hall should receive a dose if he did not receive a dose on or after his 16th birthday. Adults who have certain high-risk conditions should receive one or more doses of vaccine.  Hepatitis A vaccine. Adults who wish to be protected from this disease, have certain high-risk conditions, work with hepatitis A-infected animals, work in hepatitis A research labs, or travel to or work in countries with a high rate of hepatitis A should be immunized. Adults who were  previously unvaccinated and who anticipate close contact with an international adoptee during the first 60 days after arrival in the [place] from a country with a high rate of hepatitis A should be immunized.  Hepatitis B vaccine. Adults who wish to be protected from this disease, have certain high-risk conditions, may be exposed to blood or other infectious body fluids, are household contacts or sex partners of hepatitis B positive people, are clients or workers in certain care facilities, or travel to or work in countries with a high rate of hepatitis B should be immunized.  Haemophilus influenzae type b (Hib) vaccine. A  previously unvaccinated person with asplenia or sickle cell disease or having a scheduled splenectomy should receive 1 dose of Hib vaccine. Regardless of previous immunization, a recipient of a hematopoietic stem cell transplant should receive a 3-dose series 6 12 months after his successful transplant. Hib vaccine is not recommended for adults with HIV infection. Preventive Service / Frequency Ages 62 to 3  Blood pressure check.** / Every 1 to 2 years.  Lipid and cholesterol check.** / Every 5 years beginning at age 43.  Hepatitis C blood test.** / For any individual with known risks for hepatitis C.  Skin self-exam. / Monthly.  Influenza vaccine. / Every year.  Tetanus, diphtheria, and acellular pertussis (Tdap, Td) vaccine.** / Consult your health care provider. 1 dose of Td every 10 years.  Varicella vaccine.** / Consult your health care provider.  HPV vaccine. / 3 doses over 6 months, if 48 or younger.  Measles, mumps, rubella (MMR) vaccine.** / You need at least 1 dose of MMR if you were born in 1957 or later. You may also need a second dose.  Pneumococcal 13-valent conjugate (PCV13) vaccine.** / Consult your health care provider.  Pneumococcal polysaccharide (PPSV23) vaccine.** / 1 to 2 doses if you smoke cigarettes or if you have certain  conditions.  Meningococcal vaccine.** / 1 dose if you are age 8 to 70 years and a Market researcher living in a residence hall, or have one of several medical conditions. You may also need additional booster doses.  Hepatitis A vaccine.** / Consult your health care provider.  Hepatitis B vaccine.** / Consult your health care provider.  Haemophilus influenzae type b (Hib) vaccine.** / Consult your health care provider. Ages 48 to 32  Blood pressure check.** / Every 1 to 2 years.  Lipid and cholesterol check.** / Every 5 years beginning at age 38.  Lung cancer screening. / Every year if you are aged 40 80 years and have a 30-pack-year history of smoking and currently smoke or have quit within the past 15 years. Yearly screening is stopped once you have quit smoking for at least 15 years or develop a health problem that would prevent you from having lung cancer treatment.  Fecal occult blood test (FOBT) of stool. / Every year beginning at age 4 and continuing until age 70. You may not have to do this test if you get a colonoscopy every 10 years.  Flexible sigmoidoscopy** or colonoscopy.** / Every 5 years for a flexible sigmoidoscopy or every 10 years for a colonoscopy beginning at age 76 and continuing until age 62.  Hepatitis C blood test.** / For all people born from [birth year] through 1965 and any individual with known risks for hepatitis C.  Skin self-exam. / Monthly.  Influenza vaccine. / Every year.  Tetanus, diphtheria, and acellular pertussis (Tdap/Td) vaccine.** / Consult your health care provider. 1 dose of Td every 10 years.  Varicella vaccine.** / Consult your health care provider.  Zoster vaccine.** / 1 dose for adults aged 60 years or older.  Measles, mumps, rubella (MMR) vaccine.** / You need at least 1 dose of MMR if you were born in 1957 or later. You may also need a second dose.  Pneumococcal 13-valent conjugate (PCV13) vaccine.** / Consult your health care  provider.  Pneumococcal polysaccharide (PPSV23) vaccine.** / 1 to 2 doses if you smoke cigarettes or if you have certain conditions.  Meningococcal vaccine.** / Consult your health care provider.  Hepatitis A vaccine.** / Consult your health care  provider.  Hepatitis B vaccine.** / Consult your health care provider.  Haemophilus influenzae type b (Hib) vaccine.** / Consult your health care provider. Ages 65 and over  Blood pressure check.** / Every 1 to 2 years.  Lipid and cholesterol check.**/ Every 5 years beginning at age 20.  Lung cancer screening. / Every year if you are aged 55 80 years and have a 30-pack-year history of smoking and currently smoke or have quit within the past 15 years. Yearly screening is stopped once you have quit smoking for at least 15 years or develop a health problem that would prevent you from having lung cancer treatment.  Fecal occult blood test (FOBT) of stool. / Every year beginning at age 50 and continuing until age 75. You may not have to do this test if you get a colonoscopy every 10 years.  Flexible sigmoidoscopy** or colonoscopy.** / Every 5 years for a flexible sigmoidoscopy or every 10 years for a colonoscopy beginning at age 50 and continuing until age 75.  Hepatitis C blood test.** / For all people born from 1945 through 1965 and any individual with known risks for hepatitis C.  Abdominal aortic aneurysm (AAA) screening.** / A one-time screening for ages 65 to 75 years who are current or former smokers.  Skin self-exam. / Monthly.  Influenza vaccine. / Every year.  Tetanus, diphtheria, and acellular pertussis (Tdap/Td) vaccine.** / 1 dose of Td every 10 years.  Varicella vaccine.** / Consult your health care provider.  Zoster vaccine.** / 1 dose for adults aged 60 years or older.  Pneumococcal 13-valent conjugate (PCV13) vaccine.** / Consult your health care provider.  Pneumococcal polysaccharide (PPSV23) vaccine.** / 1 dose for all  adults aged 65 years and older.  Meningococcal vaccine.** / Consult your health care provider.  Hepatitis A vaccine.** / Consult your health care provider.  Hepatitis B vaccine.** / Consult your health care provider.  Haemophilus influenzae type b (Hib) vaccine.** / Consult your health care provider. **Family history and personal history of risk and conditions may change your health care provider's recommendations. Document Released: 02/17/2001 Document Revised: 10/12/2012 Document Reviewed: 05/19/2010 [employer] Patient Information 2014 [employer].  

## 2013-02-03 ENCOUNTER — Telehealth: Payer: Self-pay | Admitting: Family Medicine

## 2013-02-03 NOTE — Telephone Encounter (Signed)
Unable to reach Pre-Visit.   

## 2013-02-03 NOTE — Telephone Encounter (Signed)
Relevant patient education mailed to patient.  

## 2013-02-06 ENCOUNTER — Other Ambulatory Visit: Payer: Medicare PPO

## 2013-02-07 ENCOUNTER — Other Ambulatory Visit (INDEPENDENT_AMBULATORY_CARE_PROVIDER_SITE_OTHER): Payer: Medicare PPO

## 2013-02-07 DIAGNOSIS — R7309 Other abnormal glucose: Secondary | ICD-10-CM

## 2013-02-07 DIAGNOSIS — Z125 Encounter for screening for malignant neoplasm of prostate: Secondary | ICD-10-CM

## 2013-02-07 DIAGNOSIS — E785 Hyperlipidemia, unspecified: Secondary | ICD-10-CM

## 2013-02-07 LAB — HEPATIC FUNCTION PANEL
ALT: 18 U/L (ref 0–53)
AST: 22 U/L (ref 0–37)
Albumin: 3.7 g/dL (ref 3.5–5.2)
Alkaline Phosphatase: 49 U/L (ref 39–117)
BILIRUBIN DIRECT: 0.2 mg/dL (ref 0.0–0.3)
TOTAL PROTEIN: 5.8 g/dL — AB (ref 6.0–8.3)
Total Bilirubin: 1 mg/dL (ref 0.3–1.2)

## 2013-02-07 LAB — POCT URINALYSIS DIPSTICK
BILIRUBIN UA: NEGATIVE
Blood, UA: NEGATIVE
Glucose, UA: NEGATIVE
Ketones, UA: NEGATIVE
LEUKOCYTES UA: NEGATIVE
NITRITE UA: NEGATIVE
PH UA: 7.5
Protein, UA: NEGATIVE
Spec Grav, UA: 1.01
UROBILINOGEN UA: 0.2

## 2013-02-07 LAB — BASIC METABOLIC PANEL
BUN: 22 mg/dL (ref 6–23)
CO2: 27 meq/L (ref 19–32)
Calcium: 9 mg/dL (ref 8.4–10.5)
Chloride: 103 mEq/L (ref 96–112)
Creatinine, Ser: 1 mg/dL (ref 0.4–1.5)
GFR: 83.49 mL/min (ref >60.00)
GLUCOSE: 99 mg/dL (ref 70–99)
POTASSIUM: 4.8 meq/L (ref 3.5–5.1)
SODIUM: 136 meq/L (ref 135–145)

## 2013-02-07 LAB — CBC WITH DIFFERENTIAL/PLATELET
Basophils Absolute: 0 10*3/uL (ref 0.0–0.1)
Basophils Relative: 0.6 % (ref 0.0–3.0)
EOS PCT: 5.7 % — AB (ref 0.0–5.0)
Eosinophils Absolute: 0.4 10*3/uL (ref 0.0–0.7)
HCT: 43.4 % (ref 39.0–52.0)
Hemoglobin: 14.5 g/dL (ref 13.0–17.0)
Lymphocytes Relative: 33 % (ref 12.0–46.0)
Lymphs Abs: 2.3 10*3/uL (ref 0.7–4.0)
MCHC: 33.5 g/dL (ref 30.0–36.0)
MCV: 94.9 fl (ref 78.0–100.0)
MONOS PCT: 9.8 % (ref 3.0–12.0)
Monocytes Absolute: 0.7 10*3/uL (ref 0.1–1.0)
NEUTROS PCT: 50.9 % (ref 43.0–77.0)
Neutro Abs: 3.6 10*3/uL (ref 1.4–7.7)
PLATELETS: 191 10*3/uL (ref 150.0–400.0)
RBC: 4.57 Mil/uL (ref 4.22–5.81)
RDW: 13.6 % (ref 11.5–14.6)
WBC: 7.1 10*3/uL (ref 4.5–10.5)

## 2013-02-07 LAB — LIPID PANEL
Cholesterol: 150 mg/dL (ref 0–200)
HDL: 49.2 mg/dL (ref >39.00)
LDL Cholesterol: 77 mg/dL (ref 0–99)
Total CHOL/HDL Ratio: 3
Triglycerides: 119 mg/dL (ref 0.0–149.0)
VLDL: 23.8 mg/dL (ref 0.0–40.0)

## 2013-02-07 LAB — PSA: PSA: 3.87 ng/mL (ref 0.10–4.00)

## 2013-02-07 LAB — HEMOGLOBIN A1C: Hgb A1c MFr Bld: 5.8 % (ref 4.6–6.5)

## 2013-02-13 ENCOUNTER — Encounter (INDEPENDENT_AMBULATORY_CARE_PROVIDER_SITE_OTHER): Payer: Self-pay | Admitting: Surgery

## 2013-02-13 ENCOUNTER — Ambulatory Visit (INDEPENDENT_AMBULATORY_CARE_PROVIDER_SITE_OTHER): Payer: Medicare PPO | Admitting: Surgery

## 2013-02-13 VITALS — BP 118/70 | HR 80 | Temp 98.0°F | Resp 15 | Ht 73.5 in | Wt 192.0 lb

## 2013-02-13 DIAGNOSIS — M7989 Other specified soft tissue disorders: Secondary | ICD-10-CM | POA: Insufficient documentation

## 2013-02-13 DIAGNOSIS — M799 Soft tissue disorder, unspecified: Secondary | ICD-10-CM

## 2013-02-13 NOTE — Progress Notes (Signed)
General Surgery [facility]  Chief Complaint  Patient presents with  . New Evaluation    evaluate mass on back - referral from Dr. Garnet Koyanagi    HISTORY: Patient is a 68 year old male referred by his primary care physician for evaluation of a newly diagnosed soft tissue mass on the right posterior shoulder. Patient first noted this approximately 3 weeks ago while exercising with his athletic trainer. He denies any discomfort. The mass does not appear to limit the range of motion of the shoulder. He has had no previous such lesions removed. He did undergo excision of the mass at the angle of the right mandible as an infant. No recent history of trauma.  Past Medical History  Diagnosis Date  . Hyperlipidemia   . 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 Depression   . Back pain   . Hypertension     Current Outpatient Prescriptions  Medication Sig Dispense Refill  . aspirin 325 MG EC tablet Take 325 mg by mouth daily.        . Calcium Carbonate-Vit D-Min (CALCIUM 1200 PO) Take 1 tablet by mouth 2 (two) times daily.        . cholecalciferol (VITAMIN D) 1000 UNITS tablet Take 1,000 Units by mouth 2 (two) times daily.        . CRESTOR 20 MG tablet TAKE 1 TABLET BY MOUTH EVERY DAY  30 tablet  0  . DENAVIR 1 % cream Apply 1 application topically. As needed for cold sores      . Garlic-Calcium (BL GARLIC PO) Take 1,950 mg by mouth daily.      . Glucosamine-Chondroit-Vit C-Mn (GLUCOSAMINE CHONDR 1500 COMPLX PO) Take 1 tablet by mouth 2 (two) times daily.       Marland Kitchen lisinopril (PRINIVIL,ZESTRIL) 20 MG tablet 1 tab by mouth daily---office visit due now  30 tablet  0  . multivitamin (THERAGRAN) per tablet Take 1 tablet by mouth daily.        . Omega 3 1000 MG CAPS Take 1 capsule by mouth 2 (two) times daily.      . sertraline (ZOLOFT) 100 MG tablet Take 100 mg by mouth daily.        . Turmeric 500  MG CAPS Take 1 capsule by mouth daily.       No current facility-administered medications for this visit.    No Known Allergies  Family History  Problem Relation Age of Onset  . Alzheimer's disease Mother 32  . Cancer Father 41    sm cell carcinoma lung  . Cancer Sister 72    breast  . Hyperlipidemia Paternal Grandfather     History   Social History  . Marital Status: Married    Spouse Name: N/A    Number of Children: N/A  . Years of Education: N/A   Occupational History  . Sales textiles    Social History Main Topics  . Smoking status: Former Smoker -- .2 years    Types: Cigarettes    Quit date: 09/15/1967  . Smokeless tobacco: Never Used  . Alcohol Use: 1.2 oz/week    2 Glasses of wine per week     Comment: sometimes nightly  . Drug Use: No  . Sexual Activity: Yes   Other Topics Concern  . None   Social History Narrative  . None    REVIEW OF SYSTEMS - PERTINENT POSITIVES  ONLY: Denies pain. Denies limitation with range of motion. Uncertain of duration.  EXAM: Filed Vitals:   02/13/13 0905  BP: 118/70  Pulse: 80  Temp: 98 F (36.7 C)  Resp: 15    GENERAL: well-developed, well-nourished, no acute distress HEENT: normocephalic; pupils equal and reactive; sclerae clear; dentition good; mucous membranes moist NECK:  symmetric on extension; no palpable anterior or posterior cervical lymphadenopathy; no supraclavicular masses; no tenderness CHEST: clear to auscultation bilaterally without rales, rhonchi, or wheezes CARDIAC: regular rate and rhythm without significant murmur; peripheral pulses are full BACK:  Soft tissue mass approximately 6 x 4 x 2 cm in size arising on the chest wall underneath the right scapula; may be some muscular; most notable with anterior and lateral rotation of the scapula EXT:  non-tender without edema; no deformity NEURO: no gross focal deficits; no sign of tremor   LABORATORY RESULTS: See [facility] (CHL-Epic) for most  recent results  RADIOLOGY RESULTS: See [facility] (CHL-Epic) for most recent results  IMPRESSION: Soft tissue mass, 6 cm, right subscapular location, likely lipoma  PLAN: Patient and I discussed these findings. At this point I have recommended observation. He is asymptomatic. 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 The risk of malignancy is quite low.  I have recommended close observation. If this increases in size or changes in character or begins to cause discomfort, then I think he could undergo excision as an outpatient surgical procedure. We discussed the procedure, the mode of anesthesia, in the postoperative recovery. He understands and will contact us if the lesion becomes symptomatic or increases in size.  Earnstine Regal, MD, [facility]  Primary Care Physician: Garnet Koyanagi, DO

## 2013-02-13 NOTE — Patient Instructions (Signed)

## 2013-03-04 ENCOUNTER — Other Ambulatory Visit: Payer: Self-pay | Admitting: Family Medicine

## 2013-04-10 ENCOUNTER — Other Ambulatory Visit: Payer: Self-pay | Admitting: Family Medicine

## 2013-04-10 ENCOUNTER — Encounter: Payer: Self-pay | Admitting: Physician Assistant

## 2013-04-10 ENCOUNTER — Ambulatory Visit (INDEPENDENT_AMBULATORY_CARE_PROVIDER_SITE_OTHER): Payer: Medicare PPO | Admitting: Physician Assistant

## 2013-04-10 ENCOUNTER — Telehealth: Payer: Self-pay | Admitting: Physician Assistant

## 2013-04-10 ENCOUNTER — Ambulatory Visit (HOSPITAL_BASED_OUTPATIENT_CLINIC_OR_DEPARTMENT_OTHER)
Admission: RE | Admit: 2013-04-10 | Discharge: 2013-04-10 | Disposition: A | Discharge status: Home / Self Care | Payer: Medicare PPO | Source: Ambulatory Visit | Attending: Physician Assistant | Admitting: Physician Assistant

## 2013-04-10 VITALS — BP 125/80 | HR 87 | Temp 98.0°F | Wt 194.0 lb

## 2013-04-10 DIAGNOSIS — Z0000 Encounter for general adult medical examination without abnormal findings: Secondary | ICD-10-CM

## 2013-04-10 DIAGNOSIS — M25429 Effusion, unspecified elbow: Secondary | ICD-10-CM

## 2013-04-10 DIAGNOSIS — M25439 Effusion, unspecified wrist: Secondary | ICD-10-CM | POA: Insufficient documentation

## 2013-04-10 NOTE — Patient Instructions (Signed)
Please go to the [facility] for imaging.  The address is [place]  The Imaging department is on the first floor, just right of the elevators.  I will call you with your results.  Apply ice to elbow.  Keep elbow elevated.  Swelling may go down or may persist.  If persisting, we will set you up with a Sports Medicine doctor for drainage.  If you develop pain, redness or warmth of elbow, please return to office.  If you develop fever, chills with elbow tenderness, please go to the ER.

## 2013-04-10 NOTE — Assessment & Plan Note (Signed)
Vital signs within normal limits.  Very low concern for sepsis.  Seems more consistent with chronic bursitis with acute worsening of effusion secondary to trauma.  Will obtain x-ray to rule/out asymptomatic fracture.  Elevate extremity.  May attempt to ice affected elbow.  Referral placed to Sports medicine for possible drainage.

## 2013-04-10 NOTE — Telephone Encounter (Signed)
Called patient with results. X-ray negative for acute fracture. Patient already scheduled with Sports Medicine tomorrow for possible drainage of olecranon bursa.

## 2013-04-10 NOTE — Progress Notes (Signed)
Pre-visit discussion using our clinic review tool. No additional management support is needed unless otherwise documented below in the visit note.  
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
## 2013-04-11 ENCOUNTER — Encounter: Payer: Self-pay | Admitting: Family Medicine

## 2013-04-11 ENCOUNTER — Other Ambulatory Visit (INDEPENDENT_AMBULATORY_CARE_PROVIDER_SITE_OTHER): Payer: Medicare PPO

## 2013-04-11 ENCOUNTER — Ambulatory Visit (INDEPENDENT_AMBULATORY_CARE_PROVIDER_SITE_OTHER): Payer: Medicare PPO | Admitting: Family Medicine

## 2013-04-11 VITALS — BP 126/78 | HR 82 | Wt 192.0 lb

## 2013-04-11 DIAGNOSIS — M25521 Pain in right elbow: Secondary | ICD-10-CM

## 2013-04-11 DIAGNOSIS — M25529 Pain in unspecified elbow: Secondary | ICD-10-CM

## 2013-04-11 DIAGNOSIS — M7021 Olecranon bursitis, right elbow: Secondary | ICD-10-CM | POA: Insufficient documentation

## 2013-04-11 DIAGNOSIS — M7020 Olecranon bursitis, unspecified elbow: Secondary | ICD-10-CM
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
## 2013-04-11 NOTE — Patient Instructions (Signed)
Great to meet you Wear the compression for next 24 hours then always with activity. Ice 20 minutes 2 times a day Able to do any activity in 2 days Come back again in 3 weeks and we may need to do it again.

## 2013-04-11 NOTE — Assessment & Plan Note (Signed)
Actually was a hematoma. We discussed the importance of compression and icing. Patient will start doing his regular activities again in 48 hours. Patient knows that there is a 50% chance of reaccumulation. Patient does have a history of cerebrovascular accident and is on aspirin 325 mg daily. We discussed potentially going down to 162 mg daily to try to decrease the risk of reaccumulation. Patient will come back again in 2-3 weeks. We may need to consider another aspiration at that time.

## 2013-04-12 ENCOUNTER — Telehealth: Payer: Self-pay | Admitting: Family Medicine

## 2013-04-12 NOTE — Telephone Encounter (Signed)
Patient states that from his elbow down to his fingers is swollen and he wants to know if he should keep his brace on or take it off. He also wants to know if he should take an aspirin. Patient came in yesterday for OV. Please advise.

## 2013-04-12 NOTE — Telephone Encounter (Signed)
[facility] patient back told him to discontinue the compression sleeve at this time. Patient will try that and we'll restart again if it does not make an improvement. Patient also stopped by today if he does not notice any improvement in 1-2 days

## 2013-04-14 ENCOUNTER — Encounter: Payer: Self-pay | Admitting: Internal Medicine

## 2013-05-04 ENCOUNTER — Encounter: Payer: Self-pay | Admitting: Family Medicine

## 2013-05-04 ENCOUNTER — Ambulatory Visit (INDEPENDENT_AMBULATORY_CARE_PROVIDER_SITE_OTHER): Payer: Medicare PPO | Admitting: Family Medicine

## 2013-05-04 VITALS — BP 118/78 | HR 75

## 2013-05-04 DIAGNOSIS — M7021 Olecranon bursitis, right elbow: Secondary | ICD-10-CM

## 2013-05-04 DIAGNOSIS — M7020 Olecranon bursitis, unspecified elbow: Secondary | ICD-10-CM

## 2013-05-04 NOTE — Assessment & Plan Note (Signed)
Complete resolved at this time. We discussed patient's aspirin and doing 162 mg once proven in medical literature to decrease stroke prophylaxis and is recommended today. Patient also recommended to see neurologist for further recommendations. Patient states since stopping the aspirate lowering the dose he is noticed significant less bruising. Patient will follow up and again on an as-needed basis otherwise.

## 2013-05-04 NOTE — Progress Notes (Signed)
  [facility] [address], [place] [address] Phone: [phone] Subjective:     CC: Right elbow swelling follow up  [id number] Antonio Stephens is a 68 y.o. male coming in for followup of his right elbow olecranon bursitis hematoma. Patient states that it has been doing wonderful. Patient has not noticed any significant swelling at all and has full range of motion with no pain. Patient did decrease his aspirin to 81 mg daily. Patient was taking this for stroke prophylaxis. Overall patient is very happy with the results and has not even worn the compression sleeve for the last week.     Past medical history, social, surgical and family history all reviewed in electronic medical record.   Review of Systems: No headache, visual changes, nausea, vomiting, diarrhea, constipation, dizziness, abdominal pain, skin rash, fevers, chills, night sweats, weight loss, swollen lymph nodes, body aches, joint swelling, muscle aches, chest pain, shortness of breath, mood changes.   Objective Blood pressure 118/78, pulse 75, SpO2 97.00%.  General: No apparent distress alert and oriented x3 mood and affect normal, dressed appropriately.  HEENT: Pupils equal, extraocular movements intact  Respiratory: Patient's speak in full sentences and does not appear short of breath  Cardiovascular: No lower extremity edema, non tender, no erythema  Skin: Warm dry intact with no signs of infection or rash on extremities or on axial skeleton.  Abdomen: Soft nontender  Neuro: Cranial nerves II through XII are intact, neurovascularly intact in all extremities with 2+ DTRs and 2+ pulses.  Lymph: No lymphadenopathy of posterior or anterior cervical chain or axillae bilaterally.  Gait normal with good balance and coordination.  MSK:  Non tender with full range of motion and good stability and symmetric strength and tone of shoulders,  wrist, hip, knee and ankles bilaterally.   Elbow:  Right No swelling noted Range of motion full pronation, supination, flexion, extension. Strength is full to all of the above directions Stable to varus, valgus stress. Negative moving valgus stress test. No discrete areas of tenderness to palpation. Ulnar nerve does not sublux. Negative cubital tunnel Tinel's.     Impression and Recommendations:     This case required medical decision making of moderate complexity.

## 2013-05-11 ENCOUNTER — Ambulatory Visit (INDEPENDENT_AMBULATORY_CARE_PROVIDER_SITE_OTHER): Payer: Self-pay | Admitting: *Deleted

## 2013-05-11 DIAGNOSIS — I6350 Cerebral infarction due to unspecified occlusion or stenosis of unspecified cerebral artery: Secondary | ICD-10-CM

## 2013-05-11 DIAGNOSIS — I639 Cerebral infarction, unspecified: Secondary | ICD-10-CM

## 2013-05-11 NOTE — Progress Notes (Signed)
Participant in the office today for his 4th. Annual and EXIT Visit. MMSE was completed successfully without any problems. Blood was drawn and shipped.  Participant has been off study drug since late 2013.  Participant to continue with follow-up  visits with his PCP.  Participant will be notified of study trial  results in late 2015.

## 2013-05-23 ENCOUNTER — Ambulatory Visit (AMBULATORY_SURGERY_CENTER): Payer: Self-pay

## 2013-05-23 VITALS — Ht 73.5 in | Wt 191.4 lb

## 2013-05-23 DIAGNOSIS — Z1211 Encounter for screening for malignant neoplasm of colon: Secondary | ICD-10-CM

## 2013-05-23 MED ORDER — MOVIPREP 100 G PO SOLR: ORAL | Status: DC

## 2013-05-23 NOTE — Progress Notes (Signed)
Pt came into the office today for his PV prior to his colonoscopy with Dr Henrene Pastor on 06/06/13. He states he had a colonoscopy done over 10 years ago at [facility]. A medical release form was sent to [facility].    Per pt, no allergies to soy or egg products.Pt not taking any weight loss meds or using  O2 at home.

## 2013-06-06 ENCOUNTER — Ambulatory Visit (AMBULATORY_SURGERY_CENTER): Payer: Medicare PPO | Admitting: Internal Medicine

## 2013-06-06 ENCOUNTER — Encounter: Payer: Self-pay | Admitting: Internal Medicine

## 2013-06-06 VITALS — BP 134/85 | HR 68 | Temp 97.5°F | Resp 14 | Ht 73.5 in | Wt 191.0 lb

## 2013-06-06 DIAGNOSIS — D126 Benign neoplasm of colon, unspecified: Secondary | ICD-10-CM

## 2013-06-06 DIAGNOSIS — Z1211 Encounter for screening for malignant neoplasm of colon: Secondary | ICD-10-CM

## 2013-06-06 MED ORDER — SODIUM CHLORIDE 0.9 % IV SOLN: 500.0000 mL | INTRAVENOUS | Status: DC

## 2013-06-06 NOTE — Progress Notes (Signed)
Called to room to assist during endoscopic procedure.  Patient ID and intended procedure confirmed with present staff. Received instructions for my participation in the procedure from the performing physician.  

## 2013-06-06 NOTE — Progress Notes (Signed)
Pt transferred to PACU without incident. PACU RN at bedside.  Report given.  Pt drowsy but adequate resp on RA and oriented.  VSS.  No apparent anes complications

## 2013-06-06 NOTE — Op Note (Signed)
[place]  [address] [place], [id number]   COLONOSCOPY PROCEDURE REPORT  PATIENT: Antonio Stephens, Antonio Stephens  MR#: [id number] BIRTHDATE: 06-11-1945 , 67  yrs. old GENDER: Male ENDOSCOPIST: Eustace Quail, MD REFERRED PN:TIRWER [place], DO PROCEDURE DATE:  06/06/2013 PROCEDURE:   Colonoscopy with snare polypectomy x3 First Screening Colonoscopy - Avg.  risk and is 50 yrs.  old or older - No.  Prior Negative Screening - Now for repeat screening. 10 or more years since last screening  History of Adenoma - Now for follow-up colonoscopy & has been > or = to 3 yrs.  N/A  Polyps Removed Today? Yes. ASA CLASS:   Class II INDICATIONS:average risk screening. Index exam May 2005 with hyperplastic polyps (Dr. Penelope Coop). MEDICATIONS: MAC sedation, administered by CRNA and propofol (Diprivan) 280mg  IV  DESCRIPTION OF PROCEDURE:   After the risks benefits and alternatives of the procedure were thoroughly explained, informed consent was obtained.  A digital rectal exam revealed no abnormalities of the rectum.   The LB [id number] [id number]  endoscope was introduced through the anus and advanced to the cecum, which was identified by both the appendix and ileocecal valve. No adverse events experienced.   The quality of the prep was excellent.  The instrument was then slowly withdrawn as the colon was fully examined.  COLON FINDINGS: Three diminutive polyps were found in the ascending colon.  A polypectomy was performed with a cold snare.  The resection was complete and the polyp tissue was completely retrieved.   Moderate diverticulosis was noted in the sigmoid colon.   The colon mucosa was otherwise normal.  Retroflexed views revealed internal hemorrhoids. The time to cecum=1 minutes 46 seconds.  Withdrawal time=10 minutes 16 seconds.  The scope was withdrawn and the procedure completed. COMPLICATIONS: There were no complications.  ENDOSCOPIC IMPRESSION: 1.   Three diminutive polyps  were found in the ascending colon; polypectomy was performed with a cold snare 2.   Moderate diverticulosis was noted in the sigmoid colon 3.   The colon mucosa was otherwise normal  RECOMMENDATIONS: 1. Follow up colonoscopy in 5 years   eSigned:  Eustace Quail, MD 06/06/2013 10:19 AM   cc: Rosalita Chessman, DO and The Patient

## 2013-06-06 NOTE — Patient Instructions (Signed)
FOLLOW DISCHARGE INSTRUCTIONS (BLUE AND GREEN SHEETS).YOU HAD AN ENDOSCOPIC PROCEDURE TODAY AT [facility]: Refer to the procedure report that was given to you for any specific questions about what was found during the examination.  If the procedure report does not answer your questions, please call your gastroenterologist to clarify.  If you requested that your care partner not be given the details of your procedure findings, then the procedure report has been included in a sealed envelope for you to review at your convenience later.  YOU SHOULD EXPECT: Some feelings of bloating in the abdomen. Passage of more gas than usual.  Walking can help get rid of the air that was put into your GI tract during the procedure and reduce the bloating. If you had a lower endoscopy (such as a colonoscopy or flexible sigmoidoscopy) you may notice spotting of blood in your stool or on the toilet paper. If you underwent a bowel prep for your procedure, then you may not have a normal bowel movement for a few days.  DIET: Your first meal following the procedure should be a light meal and then it is ok to progress to your normal diet.  A half-sandwich or bowl of soup is an example of a good first meal.  Heavy or fried foods are harder to digest and may make you feel nauseous or bloated.  Likewise meals heavy in dairy and vegetables can cause extra gas to form and this can also increase the bloating.  Drink plenty of fluids but you should avoid alcoholic beverages for 24 hours.  ACTIVITY: Your care partner should take you home directly after the procedure.  You should plan to take it easy, moving slowly for the rest of the day.  You can resume normal activity the day after the procedure however you should NOT DRIVE or use heavy machinery for 24 hours (because of the sedation medicines used during the test).    SYMPTOMS TO REPORT IMMEDIATELY: A gastroenterologist can be reached at any hour.  During normal  business hours, 8:30 AM to 5:00 PM Monday through Friday, call [phone].  After hours and on weekends, please call the GI answering service at [phone] who will take a message and have the physician on call contact you.   Following lower endoscopy (colonoscopy or flexible sigmoidoscopy):  Excessive amounts of blood in the stool  Significant tenderness or worsening of abdominal pains  Swelling of the abdomen that is new, acute  Fever of 100F or higher    FOLLOW UP: If any biopsies were taken you will be contacted by phone or by letter within the next 1-3 weeks.  Call your gastroenterologist if you have not heard about the biopsies in 3 weeks.  Our staff will call the home number listed on your records the next business day following your procedure to check on you and address any questions or concerns that you may have at that time regarding the information given to you following your procedure. This is a courtesy call and so if there is no answer at the home number and we have not heard from you through the emergency physician on call, we will assume that you have returned to your regular daily activities without incident.  SIGNATURES/CONFIDENTIALITY: You and/or your care partner have signed paperwork which will be entered into your electronic medical record.  These signatures attest to the fact that that the information above on your After Visit Summary has been reviewed and is understood.  Full responsibility of the confidentiality of this discharge information lies with you and/or your care-partner.  Polyps, diverticulosis, high fiber diet information given.  Next colonoscopy 5 years-2020.

## 2013-06-07 ENCOUNTER — Telehealth: Payer: Self-pay | Admitting: *Deleted

## 2013-06-07 NOTE — Telephone Encounter (Signed)
  Follow up Call-  Call back number 06/06/2013  Post procedure Call Back phone  # [phone]  Permission to leave phone message Yes     Patient questions:  Do you have a fever, pain , or abdominal swelling? no Pain Score  0 *  Have you tolerated food without any problems? yes  Have you been able to return to your normal activities? yes  Do you have any questions about your discharge instructions: Diet   no Medications  no Follow up visit  no  Do you have questions or concerns about your Care? no  Actions: * If pain score is 4 or above: No action needed, pain <4.  Spoke with spouse

## 2013-06-13 ENCOUNTER — Encounter: Payer: Self-pay | Admitting: Internal Medicine

## 2013-06-19 ENCOUNTER — Encounter: Payer: Self-pay | Admitting: Family Medicine

## 2013-07-03 ENCOUNTER — Encounter: Payer: Self-pay | Admitting: Family Medicine

## 2013-07-03 ENCOUNTER — Ambulatory Visit (INDEPENDENT_AMBULATORY_CARE_PROVIDER_SITE_OTHER): Payer: Medicare PPO | Admitting: Family Medicine

## 2013-07-03 VITALS — BP 120/82 | HR 68 | Ht 73.5 in | Wt 190.0 lb

## 2013-07-03 DIAGNOSIS — M7020 Olecranon bursitis, unspecified elbow: Secondary | ICD-10-CM

## 2013-07-03 DIAGNOSIS — M7021 Olecranon bursitis, right elbow: Secondary | ICD-10-CM

## 2013-07-03 NOTE — Progress Notes (Signed)
  [facility] [place] [address], [place] [address] Phone: [phone] Subjective:     CC: Right elbow swelling follow up  EPP:[id number] Cid Agena Fawley is a 68 y.o. male coming in for followup of his right elbow olecranon bursitis hematoma. Patient was last seen 2 months ago and did not have a reaccumulation. Patient was doing very well. Unfortunately after some time it has re-accumulated. No pain but annoying, denies numbness or any radiation of pain. Patient denies any trauma.     Past medical history, social, surgical and family history all reviewed in electronic medical record.   Review of Systems: No headache, visual changes, nausea, vomiting, diarrhea, constipation, dizziness, abdominal pain, skin rash, fevers, chills, night sweats, weight loss, swollen lymph nodes, body aches, joint swelling, muscle aches, chest pain, shortness of breath, mood changes.   Objective Blood pressure 120/82, pulse 68, height 6' 1.5" (1.867 m), weight 190 lb (86.183 kg), SpO2 95.00%.  General: No apparent distress alert and oriented x3 mood and affect normal, dressed appropriately.  HEENT: Pupils equal, extraocular movements intact  Respiratory: Patient's speak in full sentences and does not appear short of breath  Cardiovascular: No lower extremity edema, non tender, no erythema  Skin: Warm dry intact with no signs of infection or rash on extremities or on axial skeleton.  Abdomen: Soft nontender  Neuro: Cranial nerves II through XII are intact, neurovascularly intact in all extremities with 2+ DTRs and 2+ pulses.  Lymph: No lymphadenopathy of posterior or anterior cervical chain or axillae bilaterally.  Gait normal with good balance and coordination.  MSK:  Non tender with full range of motion and good stability and symmetric strength and tone of shoulders,  wrist, hip, knee and ankles bilaterally.   Elbow: Right Patients elect number sinus has increased in size  again but significantly smaller than the first initial visit. Range of motion full pronation, supination, flexion, extension. Strength is full to all of the above directions Stable to varus, valgus stress. Negative moving valgus stress test. No discrete areas of tenderness to palpation. Ulnar nerve does not sublux. Negative cubital tunnel Tinel's.   Musculoskeletal ultrasound was performed and interpreted by Charlann Boxer D.O.  Elbow: Right  Lateral epicondyle and common extensor tendon origin visualized. No edema, effusions, or avulsions seen.  Radial head unremarkable and located in annular ligament  Medial epicondyle and common flexor tendon origin visualized. No edema, effusions, or avulsions seen. Ulnar nerve in cubital tunnel unremarkable.  Olecranon bursa does have has swelling but no coagulative blood seen.  triceps insertion visualized and unremarkable without edema, effusion, or avulsion. No signs olecranon bursitis.  Power doppler signal normal.  IMPRESSION: Olecranon bursitis  After verbal consent patient was prepped with alcohol swabs and with a 23-gauge 1-1/2 inch needle was injected with 3 cc of 0.5% Marcaine. Patient then had 5 cc of frank blood were removed from the olecranon with ultrasound guidance. Patient had 1 cc of Kenalog 40 mg/dL injected. Patient tolerated the procedure very well.  Compression dressing applied afterwards and post aspiration instructions given.    Impression and Recommendations:     This case required medical decision making of moderate complexity.

## 2013-07-03 NOTE — Assessment & Plan Note (Signed)
Patient did have aspiration again done today. We discussed continued compression. We discussed changing different activities in his regular the day to avoid chronic compression. Patient then is going to come back again in 3 weeks for further evaluation. Encourage patient to continue the low-dose aspirin.

## 2013-07-03 NOTE — Patient Instructions (Signed)
Good to see you again.  Continue the compression if putting pressure on it.  Maybe for next 2 days wear it straight.  Ice 20 minutes 2 times day fr the next week.  Pillow would be great for the elbow.  Come back in 3 weeks to have it checked again.

## 2013-07-24 ENCOUNTER — Ambulatory Visit: Payer: Medicare PPO | Admitting: Family Medicine

## 2013-09-09 ENCOUNTER — Other Ambulatory Visit: Payer: Self-pay | Admitting: Family Medicine

## 2013-09-12 ENCOUNTER — Encounter: Payer: Self-pay | Admitting: Family Medicine

## 2013-09-12 ENCOUNTER — Ambulatory Visit (INDEPENDENT_AMBULATORY_CARE_PROVIDER_SITE_OTHER): Payer: Medicare PPO | Admitting: Family Medicine

## 2013-09-12 VITALS — BP 138/84 | HR 77 | Ht 73.5 in | Wt 190.0 lb

## 2013-09-12 DIAGNOSIS — M7021 Olecranon bursitis, right elbow: Secondary | ICD-10-CM

## 2013-09-12 DIAGNOSIS — M7020 Olecranon bursitis, unspecified elbow: Secondary | ICD-10-CM

## 2013-09-12 NOTE — Assessment & Plan Note (Signed)
Discussed with patient again. This is the third time in the last 6 months patient has had an aspiration and drainage. This is more of an old fusion than a true hematoma this time. This is likely means that it is resolving. Patient was given another Kenalog injection  I am hoping this will scar the bursa down. Patient will continue to wear the compression sleeve for the next 48 hours and continue to avoid direct contact when possible. We'll continue to monitor closely. Patient is going out of town for the next 3 weeks and I would like to see him in 4 weeks when he returns to make sure that this does not reaccumulate. If he does reaccumulate before April we may want to consider referral to orthopedic surgeon for removal will continue to monitor.  Spent greater than 25 minutes with patient face-to-face and had greater than 50% of counseling including as described above in assessment and plan.

## 2013-09-12 NOTE — Progress Notes (Signed)
  [facility] [place], [place] [address] Phone: [phone] Subjective:     CC: Right elbow swelling follow up  NTZ:[id number] Arcangel Minion Peggs is a 68 y.o. male coming in for followup of his right elbow olecranon bursitis hematoma. Patient was seen 12 weeks ago for similar presentation. Patient states it is starting to reaccumulate again. Patient is going out of town for the next 3 weeks and wanted to have it potentially draining again. Not giving him any significant discomfort but is continuing to give him uncomfortable positioning from time to time. Denies any new symptoms such as radiation, weakness, fevers or chills or any abnormal weight loss.      Past medical history, social, surgical and family history all reviewed in electronic medical record.   Review of Systems: No headache, visual changes, nausea, vomiting, diarrhea, constipation, dizziness, abdominal pain, skin rash, fevers, chills, night sweats, weight loss, swollen lymph nodes, body aches, joint swelling, muscle aches, chest pain, shortness of breath, mood changes.   Objective Blood pressure 138/84, pulse 77, height 6' 1.5" (1.867 m), weight 190 lb (86.183 kg), SpO2 97.00%.  General: No apparent distress alert and oriented x3 mood and affect normal, dressed appropriately.  HEENT: Pupils equal, extraocular movements intact  Respiratory: Patient's speak in full sentences and does not appear short of breath  Cardiovascular: No lower extremity edema, non tender, no erythema  Skin: Warm dry intact with no signs of infection or rash on extremities or on axial skeleton.  Abdomen: Soft nontender  Neuro: Cranial nerves II through XII are intact, neurovascularly intact in all extremities with 2+ DTRs and 2+ pulses.  Lymph: No lymphadenopathy of posterior or anterior cervical chain or axillae bilaterally.  Gait normal with good balance and coordination.  MSK:  Non tender with full range of  motion and good stability and symmetric strength and tone of shoulders,  wrist, hip, knee and ankles bilaterally.   Elbow: Right Patients elect number sinus has increased in size again but significantly smaller than the first initial visit. Range of motion full pronation, supination, flexion, extension. Strength is full to all of the above directions Stable to varus, valgus stress. Negative moving valgus stress test. No discrete areas of tenderness to palpation. Ulnar nerve does not sublux. Negative cubital tunnel Tinel's.   Musculoskeletal ultrasound was performed and interpreted by Charlann Boxer D.O.  Elbow: Right  Lateral epicondyle and common extensor tendon origin visualized. No edema, effusions, or avulsions seen.  Radial head unremarkable and located in annular ligament  Medial epicondyle and common flexor tendon origin visualized. No edema, effusions, or avulsions seen. Ulnar nerve in cubital tunnel unremarkable.  Olecranon bursa does have has swelling but no coagulative blood seen.  triceps insertion visualized and unremarkable without edema, effusion, or avulsion. No signs olecranon bursitis.  Power doppler signal normal.  IMPRESSION: Olecranon bursitis  After verbal consent patient was prepped with alcohol swabs and with a 23-gauge 1-1/2 inch needle was injected with 3 cc of 0.5% Marcaine. Patient then had 6 cc of straw-colored were removed from the olecranon with ultrasound guidance. Patient had 1 cc of Kenalog 40 mg/dL injected. Patient tolerated the procedure very well.  Compression dressing applied afterwards and post aspiration instructions given.    Impression and Recommendations:     This case required medical decision making of moderate complexity.

## 2013-09-14 ENCOUNTER — Other Ambulatory Visit: Payer: Self-pay | Admitting: Family Medicine

## 2013-10-07 ENCOUNTER — Other Ambulatory Visit: Payer: Self-pay | Admitting: Family Medicine

## 2013-11-11 ENCOUNTER — Other Ambulatory Visit: Payer: Self-pay | Admitting: Family Medicine

## 2013-12-13 ENCOUNTER — Other Ambulatory Visit: Payer: Self-pay | Admitting: Family Medicine

## 2014-01-05 DIAGNOSIS — C61 Malignant neoplasm of prostate: Secondary | ICD-10-CM

## 2014-01-05 HISTORY — DX: Malignant neoplasm of prostate: C61

## 2014-02-05 ENCOUNTER — Encounter: Payer: Medicare PPO | Admitting: Family Medicine

## 2014-02-27 DIAGNOSIS — H18413 Arcus senilis, bilateral: Secondary | ICD-10-CM | POA: Diagnosis not present

## 2014-02-27 DIAGNOSIS — H25043 Posterior subcapsular polar age-related cataract, bilateral: Secondary | ICD-10-CM | POA: Diagnosis not present

## 2014-02-27 DIAGNOSIS — H25013 Cortical age-related cataract, bilateral: Secondary | ICD-10-CM | POA: Diagnosis not present

## 2014-02-27 DIAGNOSIS — H2513 Age-related nuclear cataract, bilateral: Secondary | ICD-10-CM | POA: Diagnosis not present

## 2014-02-27 DIAGNOSIS — H409 Unspecified glaucoma: Secondary | ICD-10-CM | POA: Diagnosis not present

## 2014-03-06 ENCOUNTER — Telehealth: Payer: Self-pay

## 2014-03-06 NOTE — Telephone Encounter (Signed)
Left message with wife asking him to call back.  CCS- 06/06/13- Dr. Henrene Pastor- 3 adenomatous polyps and moderate diverticulosis PSA- 02/07/13- 3.87 Flu- ? Td- 09/05/09 PNM- 09/05/09 Shingles- 12/10/10

## 2014-03-08 ENCOUNTER — Ambulatory Visit (INDEPENDENT_AMBULATORY_CARE_PROVIDER_SITE_OTHER): Payer: Medicare PPO | Admitting: Family Medicine

## 2014-03-08 ENCOUNTER — Encounter: Payer: Self-pay | Admitting: Family Medicine

## 2014-03-08 VITALS — BP 112/80 | HR 69 | Temp 97.7°F | Ht 74.0 in | Wt 194.2 lb

## 2014-03-08 DIAGNOSIS — E785 Hyperlipidemia, unspecified: Secondary | ICD-10-CM | POA: Diagnosis not present

## 2014-03-08 DIAGNOSIS — Z79899 Other long term (current) drug therapy: Secondary | ICD-10-CM | POA: Diagnosis not present

## 2014-03-08 DIAGNOSIS — Z125 Encounter for screening for malignant neoplasm of prostate: Secondary | ICD-10-CM | POA: Diagnosis not present

## 2014-03-08 DIAGNOSIS — R5383 Other fatigue: Secondary | ICD-10-CM | POA: Diagnosis not present

## 2014-03-08 DIAGNOSIS — Z0000 Encounter for general adult medical examination without abnormal findings: Secondary | ICD-10-CM

## 2014-03-08 DIAGNOSIS — D229 Melanocytic nevi, unspecified: Secondary | ICD-10-CM

## 2014-03-08 DIAGNOSIS — I10 Essential (primary) hypertension: Secondary | ICD-10-CM

## 2014-03-08 DIAGNOSIS — R269 Unspecified abnormalities of gait and mobility: Secondary | ICD-10-CM

## 2014-03-08 DIAGNOSIS — Z23 Encounter for immunization: Secondary | ICD-10-CM

## 2014-03-08 LAB — LIPID PANEL
Cholesterol: 164 mg/dL (ref 0–200)
HDL: 61.8 mg/dL (ref >39.00)
LDL CALC: 79 mg/dL (ref 0–99)
NonHDL: 102.2
Total CHOL/HDL Ratio: 3
Triglycerides: 115 mg/dL (ref 0.0–149.0)
VLDL: 23 mg/dL (ref 0.0–40.0)

## 2014-03-08 LAB — CBC WITH DIFFERENTIAL/PLATELET
Basophils Absolute: 0 10*3/uL (ref 0.0–0.1)
Basophils Relative: 0.6 % (ref 0.0–3.0)
Eosinophils Absolute: 0.2 10*3/uL (ref 0.0–0.7)
Eosinophils Relative: 3.4 % (ref 0.0–5.0)
HCT: 43.6 % (ref 39.0–52.0)
Hemoglobin: 14.9 g/dL (ref 13.0–17.0)
LYMPHS PCT: 29.5 % (ref 12.0–46.0)
Lymphs Abs: 2.1 10*3/uL (ref 0.7–4.0)
MCHC: 34.3 g/dL (ref 30.0–36.0)
MCV: 91.3 fl (ref 78.0–100.0)
MONOS PCT: 9.2 % (ref 3.0–12.0)
Monocytes Absolute: 0.6 10*3/uL (ref 0.1–1.0)
Neutro Abs: 4 10*3/uL (ref 1.4–7.7)
Neutrophils Relative %: 57.3 % (ref 43.0–77.0)
Platelets: 196 10*3/uL (ref 150.0–400.0)
RBC: 4.77 Mil/uL (ref 4.22–5.81)
RDW: 13.7 % (ref 11.5–15.5)
WBC: 7 10*3/uL (ref 4.0–10.5)

## 2014-03-08 LAB — POCT URINALYSIS DIPSTICK
Bilirubin, UA: NEGATIVE
GLUCOSE UA: NEGATIVE
Ketones, UA: NEGATIVE
Leukocytes, UA: NEGATIVE
Nitrite, UA: NEGATIVE
PROTEIN UA: NEGATIVE
RBC UA: NEGATIVE
Spec Grav, UA: 1.02
UROBILINOGEN UA: 0.2
pH, UA: 6

## 2014-03-08 LAB — VITAMIN B12: VITAMIN B 12: 348 pg/mL (ref 211–911)

## 2014-03-08 LAB — HEPATIC FUNCTION PANEL
ALT: 14 U/L (ref 0–53)
AST: 18 U/L (ref 0–37)
Albumin: 4.3 g/dL (ref 3.5–5.2)
Alkaline Phosphatase: 58 U/L (ref 39–117)
Bilirubin, Direct: 0.2 mg/dL (ref 0.0–0.3)
TOTAL PROTEIN: 6.4 g/dL (ref 6.0–8.3)
Total Bilirubin: 0.8 mg/dL (ref 0.2–1.2)

## 2014-03-08 LAB — BASIC METABOLIC PANEL
BUN: 23 mg/dL (ref 6–23)
CALCIUM: 9.4 mg/dL (ref 8.4–10.5)
CO2: 32 meq/L (ref 19–32)
CREATININE: 1.05 mg/dL (ref 0.40–1.50)
Chloride: 102 mEq/L (ref 96–112)
GFR: 74.15 mL/min (ref >60.00)
Glucose, Bld: 105 mg/dL — ABNORMAL HIGH (ref 70–99)
Potassium: 4.7 mEq/L (ref 3.5–5.1)
Sodium: 138 mEq/L (ref 135–145)

## 2014-03-08 LAB — PSA: PSA: 4.12 ng/mL — AB (ref 0.10–4.00)

## 2014-03-08 NOTE — Progress Notes (Signed)
Pre visit review using our clinic review tool, if applicable. No additional management support is needed unless otherwise documented below in the visit note. 

## 2014-03-08 NOTE — Progress Notes (Signed)
Subjective:    Antonio Stephens is a 69 y.o. male who presents for Medicare Annual/Subsequent preventive examination. He also needs refills on meds .  Preventive Screening-Counseling & Management  Tobacco History  Smoking status  . Former Smoker -- .2 years  . Types: Cigarettes  . Quit date: 09/15/1967  Smokeless tobacco  . Never Used    Problems Prior to Visit 1. Fatigue --- and balance issues---? b12 problem 2.  Seeing ortho for knees--- [place] Current Problems (verified) Patient Active Problem List   Diagnosis Date Noted  . Olecranon bursitis of right elbow 04/11/2013  . Effusion of olecranon bursa 04/10/2013  . Soft tissue mass, right subscapular 02/13/2013  . Onychomycosis 10/16/2011  . LOC OSTEOARTHROS NOT SPEC PRIM/SEC PELV RGN&THI 12/10/2009  . HYPERGLYCEMIA, FASTING 12/10/2009  . FREQUENCY, URINARY 09/05/2009  . Unspecified essential hypertension 07/19/2009  . DEGENERATIVE JOINT DISEASE, ADVANCED 07/19/2009  . DEPRESSION, HX OF 02/05/2009  . DIASTOLIC DYSFUNCTION 14/43/1540  . CEREBROVASCULAR ACCIDENT, HX OF 12/25/2008  . HYPERLIPIDEMIA 11/18/2006  . BACK PAIN 10/14/2006    Medications Prior to Visit Current Outpatient Prescriptions on File Prior to Visit  Medication Sig Dispense Refill  . aspirin 81 MG tablet Take 81 mg by mouth daily.    . Calcium Carbonate-Vit D-Min (CALCIUM 1200 PO) Take 1 tablet by mouth 2 (two) times daily.      . cholecalciferol (VITAMIN D) 1000 UNITS tablet Take 1,000 Units by mouth 2 (two) times daily.      . CRESTOR 20 MG tablet TAKE 1 TABLET BY MOUTH EVERY DAY 30 tablet 3  . DENAVIR 1 % cream Apply 1 application topically. As needed for cold sores    . Garlic-Calcium (BL GARLIC PO) Take 0,867 mg by mouth daily.    . Glucosamine-Chondroit-Vit C-Mn (GLUCOSAMINE CHONDR 1500 COMPLX PO) Take 1 tablet by mouth 2 (two) times daily.     Marland Kitchen lisinopril (PRINIVIL,ZESTRIL) 20 MG tablet TAKE 1 TABLET BY MOUTH EVERY DAY 30 tablet 5  . multivitamin  (THERAGRAN) per tablet Take 1 tablet by mouth daily.      . Omega 3 1000 MG CAPS Take 1 capsule by mouth 2 (two) times daily.    . sertraline (ZOLOFT) 100 MG tablet Take 100 mg by mouth daily.      . timolol (TIMOPTIC) 0.5 % ophthalmic solution Place into the left eye.    . Turmeric 500 MG CAPS Take 1 capsule by mouth daily.     No current facility-administered medications on file prior to visit.    Current Medications (verified) Current Outpatient Prescriptions  Medication Sig Dispense Refill  . aspirin 81 MG tablet Take 81 mg by mouth daily.    . Calcium Carbonate-Vit D-Min (CALCIUM 1200 PO) Take 1 tablet by mouth 2 (two) times daily.      . cholecalciferol (VITAMIN D) 1000 UNITS tablet Take 1,000 Units by mouth 2 (two) times daily.      . CRESTOR 20 MG tablet TAKE 1 TABLET BY MOUTH EVERY DAY 30 tablet 3  . DENAVIR 1 % cream Apply 1 application topically. As needed for cold sores    . Garlic-Calcium (BL GARLIC PO) Take 6,195 mg by mouth daily.    . Glucosamine-Chondroit-Vit C-Mn (GLUCOSAMINE CHONDR 1500 COMPLX PO) Take 1 tablet by mouth 2 (two) times daily.     Marland Kitchen lisinopril (PRINIVIL,ZESTRIL) 20 MG tablet TAKE 1 TABLET BY MOUTH EVERY DAY 30 tablet 5  . multivitamin (THERAGRAN) per tablet Take 1 tablet by mouth  daily.      . Omega 3 1000 MG CAPS Take 1 capsule by mouth 2 (two) times daily.    . sertraline (ZOLOFT) 100 MG tablet Take 100 mg by mouth daily.      . timolol (TIMOPTIC) 0.5 % ophthalmic solution Place into the left eye.    . Turmeric 500 MG CAPS Take 1 capsule by mouth daily.     No current facility-administered medications for this visit.     Allergies (verified) Review of patient's allergies indicates no known allergies.   PAST HISTORY  Family History Family History  Problem Relation Age of Onset  . Alzheimer's disease Mother 71  . Cancer Father 49    sm cell carcinoma lung  . Cancer Sister 75    breast  . Breast cancer Sister   . Hyperlipidemia Paternal  Grandfather   . Colon cancer Neg Hx   . Stomach cancer Neg Hx     Social History History  Substance Use Topics  . Smoking status: Former Smoker -- .2 years    Types: Cigarettes    Quit date: 09/15/1967  . Smokeless tobacco: Never Used  . Alcohol Use: 4.2 - 6.0 oz/week    7-10 Glasses of wine per week     Comment: sometimes nightly    Are there smokers in your home (other than you)?  No  Risk Factors Current exercise habits: Tues, wed, thurs---trainer  Dietary issues discussed: na   Cardiac risk factors: advanced age (older than 30 for men, 68 for women), dyslipidemia, hypertension and male gender.  Depression Screen (Note: if answer to either of the following is "Yes", a more complete depression screening is indicated)   Q1: Over the past two weeks, have you felt down, depressed or hopeless? No  Q2: Over the past two weeks, have you felt Wiest interest or pleasure in doing things? No  Have you lost interest or pleasure in daily life? No  Do you often feel hopeless? No  Do you cry easily over simple problems? No  Activities of Daily Living In your present state of health, do you have any difficulty performing the following activities?:  Driving? No Managing money?  No Feeding yourself? No Getting from bed to chair? No Climbing a flight of stairs? No Preparing food and eating?: No Bathing or showering? No Getting dressed: No Getting to the toilet? No Using the toilet:No Moving around from place to place: No In the past year have you fallen or had a near fall?:No   Are you sexually active?  Yes  Do you have more than one partner?  No  Hearing Difficulties: No Do you often ask people to speak up or repeat themselves? No Do you experience ringing or noises in your ears? No Do you have difficulty understanding soft or whispered voices? No   Do you feel that you have a problem with memory? No  Do you often misplace items? No  Do you feel safe at home?   No  Cognitive Testing  Alert? Yes  Normal Appearance?Yes  Oriented to person? Yes  Place? Yes   Time? Yes  Recall of three objects?  Yes  Can perform simple calculations? Yes  Displays appropriate judgment?Yes  Can read the correct time from a watch face?Yes   Advanced Directives have been discussed with the patient? Yes   List the Names of Other Physician/Practitioners you currently use: 1.  Ortho-- Dr Johney Maine,  [place] 2.  opth-- [facility] 3. Dentist-- [facility] 4.psych--  Rosary Lively any recent Medical Services you may have received from other than [facility] providers in the past year (date may be approximate).  Immunization History  Administered Date(s) Administered  . Pneumococcal Polysaccharide-23 09/05/2009  . Td 09/05/2009  . Zoster 12/10/2010    Screening Tests Health Maintenance  Topic Date Due  . INFLUENZA VACCINE  03/08/2015 (Originally 08/05/2013)  . COLONOSCOPY  06/07/2018  . TETANUS/TDAP  09/06/2019  . PNEUMOCOCCAL POLYSACCHARIDE VACCINE AGE 35 AND OVER  Completed  . ZOSTAVAX  Completed    All answers were reviewed with the patient and necessary referrals were made:  Garnet Koyanagi, DO   03/08/2014   History reviewed:  He  has a past medical history of Hyperlipidemia; CVA (cerebral vascular accident); Depression; Back pain; and Hypertension. He  does not have any pertinent problems on file. 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 His family history includes Alzheimer's disease (age of onset: 16) in his mother; Breast cancer in his sister; Cancer (age of onset: 71) in his sister; Cancer (age of onset: 60) in his father; Hyperlipidemia in his paternal grandfather. There is no history of Colon cancer or Stomach cancer. He  reports that he quit smoking about 46 years ago. His smoking use included Cigarettes. He quit after  .2 years of use. He has never used smokeless tobacco. He reports that he drinks about 4.2 - 6.0 oz of alcohol per week. He reports that he does not use illicit drugs. He has a current medication list which includes the following prescription(s): aspirin, calcium carbonate-vit d-min, cholecalciferol, crestor, denavir, garlic-calcium, glucosamine-chondroit-vit c-mn, lisinopril, multivitamin, omega 3, sertraline, timolol, and turmeric. Current Outpatient Prescriptions on File Prior to Visit  Medication Sig Dispense Refill  . aspirin 81 MG tablet Take 81 mg by mouth daily.    . Calcium Carbonate-Vit D-Min (CALCIUM 1200 PO) Take 1 tablet by mouth 2 (two) times daily.      . cholecalciferol (VITAMIN D) 1000 UNITS tablet Take 1,000 Units by mouth 2 (two) times daily.      . CRESTOR 20 MG tablet TAKE 1 TABLET BY MOUTH EVERY DAY 30 tablet 3  . DENAVIR 1 % cream Apply 1 application topically. As needed for cold sores    . Garlic-Calcium (BL GARLIC PO) Take 0,347 mg by mouth daily.    . Glucosamine-Chondroit-Vit C-Mn (GLUCOSAMINE CHONDR 1500 COMPLX PO) Take 1 tablet by mouth 2 (two) times daily.     Marland Kitchen lisinopril (PRINIVIL,ZESTRIL) 20 MG tablet TAKE 1 TABLET BY MOUTH EVERY DAY 30 tablet 5  . multivitamin (THERAGRAN) per tablet Take 1 tablet by mouth daily.      . Omega 3 1000 MG CAPS Take 1 capsule by mouth 2 (two) times daily.    . sertraline (ZOLOFT) 100 MG tablet Take 100 mg by mouth daily.      . timolol (TIMOPTIC) 0.5 % ophthalmic solution Place into the left eye.    . Turmeric 500 MG CAPS Take 1 capsule by mouth daily.     No current facility-administered medications on file prior to visit.   He has No Known Allergies.  Review of Systems  Review of Systems  Constitutional: Negative for activity change, appetite change and fatigue.  HENT: Negative for hearing loss, congestion, tinnitus and ear discharge.   Eyes: Negative for visual disturbance (see optho q1y -- vision corrected to 20/20  with  glasses).  Respiratory: Negative for cough, chest tightness and shortness of breath.   Cardiovascular: Negative for chest pain, palpitations and leg swelling.  Gastrointestinal: Negative for abdominal pain, diarrhea, constipation and abdominal distention.  Genitourinary: Negative for urgency, frequency, decreased urine volume and difficulty urinating.  Musculoskeletal: Negative for back pain, arthralgias and gait problem.  Skin: Negative for color change, pallor and rash.  Neurological: Negative for dizziness, light-headedness, numbness and headaches.  Hematological: Negative for adenopathy. Does not bruise/bleed easily.  Psychiatric/Behavioral: Negative for suicidal ideas, confusion, sleep disturbance, self-injury, dysphoric mood, decreased concentration and agitation.  Pt is able to read and write and can do all ADLs No risk for falling No abuse/ violence in home      Objective:     Vision by Snellen chart:opth Blood pressure 112/80, pulse 69, temperature 97.7 F (36.5 C), temperature source Oral, height 6\' 2"  (1.88 m), weight 194 lb 3.2 oz (88.089 kg), SpO2 97 %. Body mass index is 24.92 kg/(m^2).  BP 112/80 mmHg  Pulse 69  Temp(Src) 97.7 F (36.5 C) (Oral)  Ht 6\' 2"  (1.88 m)  Wt 194 lb 3.2 oz (88.089 kg)  BMI 24.92 kg/m2  SpO2 97% General appearance: alert, cooperative, appears stated age and no distress Head: Normocephalic, without obvious abnormality, atraumatic Eyes: conjunctivae/corneas clear. PERRL, EOM's intact. Fundi benign. Ears: normal TM's and external ear canals both ears Nose: Nares normal. Septum midline. Mucosa normal. No drainage or sinus tenderness. Throat: lips, mucosa, and tongue normal; teeth and gums normal Neck: no adenopathy, no carotid bruit, no JVD, supple, symmetrical, trachea midline and thyroid not enlarged, symmetric, no tenderness/mass/nodules Back: symmetric, no curvature. ROM normal. No CVA tenderness. Lungs: clear to auscultation  bilaterally Chest wall: no tenderness Heart: S1, S2 normal Abdomen: soft, non-tender; bowel sounds normal; no masses,  no organomegaly Male genitalia: normal, penis: no lesions or discharge. testes: no masses or tenderness. no hernias Rectal: normal tone, normal prostate, no masses or tenderness and soft brown guaiac negative stool noted Extremities: extremities normal, atraumatic, no cyanosis or edema Pulses: 2+ and symmetric Skin: Skin color, texture, turgor normal. No rashes or lesions Lymph nodes: Cervical, supraclavicular, and axillary nodes normal. Neurologic: Alert and oriented X 3, normal strength and tone. Normal symmetric reflexes. Normal coordination and gait Psych-- no depression, no anxiety      Assessment:    cpe     Plan:     During the course of the visit the patient was educated and counseled about appropriate screening and preventive services including:    Pneumococcal vaccine   Influenza vaccine  Td vaccine  Prostate cancer screening  Colorectal cancer screening  Diabetes screening  Glaucoma screening  Advanced directives: has an advanced directive - a copy HAS NOT been provided.  Diet review for nutrition referral? Yes ____  Not Indicated __x__   Patient Instructions (the written plan) was given to the patient.  Medicare Attestation I have personally reviewed: The patient's medical and social history Their use of alcohol, tobacco or illicit drugs Their current medications and supplements The patient's functional ability including ADLs,fall risks, home safety risks, cognitive, and hearing and visual impairment Diet and physical activities Evidence for depression or mood disorders  The patient's weight, height, BMI, and visual acuity have been recorded in the chart.  I have made referrals, counseling, and provided education to the patient based on review of the above and I have provided the patient with a written personalized care plan for  preventive services.  1. Preventative health care   - PSA  2. Hyperlipidemia Check labs, con't crestor - Hepatic function panel - Lipid panel  3. Essential hypertension Stable,  con't lisinopril - Basic metabolic panel - CBC with Differential/Platelet - POCT urinalysis dipstick  4. Abnormality of gait   - Vitamin B12  5. Other fatigue   - Testosterone, Free, Total, SHBG   Garnet Koyanagi, DO   03/08/2014

## 2014-03-08 NOTE — Patient Instructions (Signed)
Preventive Care for Adults A healthy lifestyle and preventive care can promote health and wellness. Preventive health guidelines for men include the following key practices:  A routine yearly physical is a good way to check with your health care provider about your health and preventative screening. It is a chance to share any concerns and updates on your health and to receive a thorough exam.  Visit your dentist for a routine exam and preventative care every 6 months. Brush your teeth twice a day and floss once a day. Good oral hygiene prevents tooth decay and gum disease.  The frequency of eye exams is based on your age, health, family medical history, use of contact lenses, and other factors. Follow your health care provider's recommendations for frequency of eye exams.  Eat a healthy diet. Foods such as vegetables, fruits, whole grains, low-fat dairy products, and lean protein foods contain the nutrients you need without too many calories. Decrease your intake of foods high in solid fats, added sugars, and salt. Eat the right amount of calories for you.Get information about a proper diet from your health care provider, if necessary.  Regular physical exercise is one of the most important things you can do for your health. Most adults should get at least 150 minutes of moderate-intensity exercise (any activity that increases your heart rate and causes you to sweat) each week. In addition, most adults need muscle-strengthening exercises on 2 or more days a week.  Maintain a healthy weight. The body mass index (BMI) is a screening tool to identify possible weight problems. It provides an estimate of body fat based on height and weight. Your health care provider can find your BMI and can help you achieve or maintain a healthy weight.For adults 20 years and older:  A BMI below 18.5 is considered underweight.  A BMI of 18.5 to 24.9 is normal.  A BMI of 25 to 29.9 is considered overweight.  A BMI  of 30 and above is considered obese.  Maintain normal blood lipids and cholesterol levels by exercising and minimizing your intake of saturated fat. Eat a balanced diet with plenty of fruit and vegetables. Blood tests for lipids and cholesterol should begin at age 50 and be repeated every 5 years. If your lipid or cholesterol levels are high, you are over 50, or you are at high risk for heart disease, you may need your cholesterol levels checked more frequently.Ongoing high lipid and cholesterol levels should be treated with medicines if diet and exercise are not working.  If you smoke, find out from your health care provider how to quit. If you do not use tobacco, do not start.  Lung cancer screening is recommended for adults aged 73-80 years who are at high risk for developing lung cancer because of a history of smoking. A yearly low-dose CT scan of the lungs is recommended for people who have at least a 30-pack-year history of smoking and are a current smoker or have quit within the past 15 years. A pack year of smoking is smoking an average of 1 pack of cigarettes a day for 1 year (for example: 1 pack a day for 30 years or 2 packs a day for 15 years). Yearly screening should continue until the smoker has stopped smoking for at least 15 years. Yearly screening should be stopped for people who develop a health problem that would prevent them from having lung cancer treatment.  If you choose to drink alcohol, do not have more than  2 drinks per day. One drink is considered to be 12 ounces (355 mL) of beer, 5 ounces (148 mL) of wine, or 1.5 ounces (44 mL) of liquor.  Avoid use of street drugs. Do not share needles with anyone. Ask for help if you need support or instructions about stopping the use of drugs.  High blood pressure causes heart disease and increases the risk of stroke. Your blood pressure should be checked at least every 1-2 years. Ongoing high blood pressure should be treated with  medicines, if weight loss and exercise are not effective.  If you are 45-79 years old, ask your health care provider if you should take aspirin to prevent heart disease.  Diabetes screening involves taking a blood sample to check your fasting blood sugar level. This should be done once every 3 years, after age 45, if you are within normal weight and without risk factors for diabetes. Testing should be considered at a younger age or be carried out more frequently if you are overweight and have at least 1 risk factor for diabetes.  Colorectal cancer can be detected and often prevented. Most routine colorectal cancer screening begins at the age of 50 and continues through age 75. However, your health care provider may recommend screening at an earlier age if you have risk factors for colon cancer. On a yearly basis, your health care provider may provide home test kits to check for hidden blood in the stool. Use of a small camera at the end of a tube to directly examine the colon (sigmoidoscopy or colonoscopy) can detect the earliest forms of colorectal cancer. Talk to your health care provider about this at age 50, when routine screening begins. Direct exam of the colon should be repeated every 5-10 years through age 75, unless early forms of precancerous polyps or small growths are found.  People who are at an increased risk for hepatitis B should be screened for this virus. You are considered at high risk for hepatitis B if:  You were born in a country where hepatitis B occurs often. Talk with your health care provider about which countries are considered high risk.  Your parents were born in a high-risk country and you have not received a shot to protect against hepatitis B (hepatitis B vaccine).  You have HIV or AIDS.  You use needles to inject street drugs.  You live with, or have sex with, someone who has hepatitis B.  You are a man who has sex with other men (MSM).  You get hemodialysis  treatment.  You take certain medicines for conditions such as cancer, organ transplantation, and autoimmune conditions.  Hepatitis C blood testing is recommended for all people born from 1945 through 1965 and any individual with known risks for hepatitis C.  Practice safe sex. Use condoms and avoid high-risk sexual practices to reduce the spread of sexually transmitted infections (STIs). STIs include gonorrhea, chlamydia, syphilis, trichomonas, herpes, HPV, and human immunodeficiency virus (HIV). Herpes, HIV, and HPV are viral illnesses that have no cure. They can result in disability, cancer, and death.  If you are at risk of being infected with HIV, it is recommended that you take a prescription medicine daily to prevent HIV infection. This is called preexposure prophylaxis (PrEP). You are considered at risk if:  You are a man who has sex with other men (MSM) and have other risk factors.  You are a heterosexual man, are sexually active, and are at increased risk for HIV infection.    You take drugs by injection.  You are sexually active with a partner who has HIV.  Talk with your health care provider about whether you are at high risk of being infected with HIV. If you choose to begin PrEP, you should first be tested for HIV. You should then be tested every 3 months for as long as you are taking PrEP.  A one-time screening for abdominal aortic aneurysm (AAA) and surgical repair of large AAAs by ultrasound are recommended for men ages 32 to 67 years who are current or former smokers.  Healthy men should no longer receive prostate-specific antigen (PSA) blood tests as part of routine cancer screening. Talk with your health care provider about prostate cancer screening.  Testicular cancer screening is not recommended for adult males who have no symptoms. Screening includes self-exam, a health care provider exam, and other screening tests. Consult with your health care provider about any symptoms  you have or any concerns you have about testicular cancer.  Use sunscreen. Apply sunscreen liberally and repeatedly throughout the day. You should seek shade when your shadow is shorter than you. Protect yourself by wearing long sleeves, pants, a wide-brimmed hat, and sunglasses year round, whenever you are outdoors.  Once a month, do a whole-body skin exam, using a mirror to look at the skin on your back. Tell your health care provider about new moles, moles that have irregular borders, moles that are larger than a pencil eraser, or moles that have changed in shape or color.  Stay current with required vaccines (immunizations).  Influenza vaccine. All adults should be immunized every year.  Tetanus, diphtheria, and acellular pertussis (Td, Tdap) vaccine. An adult who has not previously received Tdap or who does not know his vaccine status should receive 1 dose of Tdap. This initial dose should be followed by tetanus and diphtheria toxoids (Td) booster doses every 10 years. Adults with an unknown or incomplete history of completing a 3-dose immunization series with Td-containing vaccines should begin or complete a primary immunization series including a Tdap dose. Adults should receive a Td booster every 10 years.  Varicella vaccine. An adult without evidence of immunity to varicella should receive 2 doses or a second dose if he has previously received 1 dose.  Human papillomavirus (HPV) vaccine. Males aged 68-21 years who have not received the vaccine previously should receive the 3-dose series. Males aged 22-26 years may be immunized. Immunization is recommended through the age of 6 years for any male who has sex with males and did not get any or all doses earlier. Immunization is recommended for any person with an immunocompromised condition through the age of 49 years if he did not get any or all doses earlier. During the 3-dose series, the second dose should be obtained 4-8 weeks after the first  dose. The third dose should be obtained 24 weeks after the first dose and 16 weeks after the second dose.  Zoster vaccine. One dose is recommended for adults aged 50 years or older unless certain conditions are present.  Measles, mumps, and rubella (MMR) vaccine. Adults born before [birth year] generally are considered immune to measles and mumps. Adults born in [birth year] or later should have 1 or more doses of MMR vaccine unless there is a contraindication to the vaccine or there is laboratory evidence of immunity to each of the three diseases. A routine second dose of MMR vaccine should be obtained at least 28 days after the first dose for students attending postsecondary  schools, health care workers, or international travelers. People who received inactivated measles vaccine or an unknown type of measles vaccine during 1963-1967 should receive 2 doses of MMR vaccine. People who received inactivated mumps vaccine or an unknown type of mumps vaccine before 1979 and are at high risk for mumps infection should consider immunization with 2 doses of MMR vaccine. Unvaccinated health care workers born before 1957 who lack laboratory evidence of measles, mumps, or rubella immunity or laboratory confirmation of disease should consider measles and mumps immunization with 2 doses of MMR vaccine or rubella immunization with 1 dose of MMR vaccine.  Pneumococcal 13-valent conjugate (PCV13) vaccine. When indicated, a person who is uncertain of his immunization history and has no record of immunization should receive the PCV13 vaccine. An adult aged 19 years or older who has certain medical conditions and has not been previously immunized should receive 1 dose of PCV13 vaccine. This PCV13 should be followed with a dose of pneumococcal polysaccharide (PPSV23) vaccine. The PPSV23 vaccine dose should be obtained at least 8 weeks after the dose of PCV13 vaccine. An adult aged 19 years or older who has certain medical conditions and  previously received 1 or more doses of PPSV23 vaccine should receive 1 dose of PCV13. The PCV13 vaccine dose should be obtained 1 or more years after the last PPSV23 vaccine dose.  Pneumococcal polysaccharide (PPSV23) vaccine. When PCV13 is also indicated, PCV13 should be obtained first. All adults aged 65 years and older should be immunized. An adult younger than age 65 years who has certain medical conditions should be immunized. Any person who resides in a nursing home or long-term care facility should be immunized. An adult smoker should be immunized. People with an immunocompromised condition and certain other conditions should receive both PCV13 and PPSV23 vaccines. People with human immunodeficiency virus (HIV) infection should be immunized as soon as possible after diagnosis. Immunization during chemotherapy or radiation therapy should be avoided. Routine use of PPSV23 vaccine is not recommended for American Indians, [place] Natives, or people younger than 65 years unless there are medical conditions that require PPSV23 vaccine. When indicated, people who have unknown immunization and have no record of immunization should receive PPSV23 vaccine. One-time revaccination 5 years after the first dose of PPSV23 is recommended for people aged 19-64 years who have chronic kidney failure, nephrotic syndrome, asplenia, or immunocompromised conditions. People who received 1-2 doses of PPSV23 before age 65 years should receive another dose of PPSV23 vaccine at age 65 years or later if at least 5 years have passed since the previous dose. Doses of PPSV23 are not needed for people immunized with PPSV23 at or after age 65 years.  Meningococcal vaccine. Adults with asplenia or persistent complement component deficiencies should receive 2 doses of quadrivalent meningococcal conjugate (MenACWY-D) vaccine. The doses should be obtained at least 2 months apart. Microbiologists working with certain meningococcal bacteria,  military recruits, people at risk during an outbreak, and people who travel to or live in countries with a high rate of meningitis should be immunized. A first-year college student up through age 21 years who is living in a residence hall should receive a dose if he did not receive a dose on or after his 16th birthday. Adults who have certain high-risk conditions should receive one or more doses of vaccine.  Hepatitis A vaccine. Adults who wish to be protected from this disease, have certain high-risk conditions, work with hepatitis A-infected animals, work in hepatitis A research labs, or   travel to or work in countries with a high rate of hepatitis A should be immunized. Adults who were previously unvaccinated and who anticipate close contact with an international adoptee during the first 60 days after arrival in the [place] from a country with a high rate of hepatitis A should be immunized.  Hepatitis B vaccine. Adults should be immunized if they wish to be protected from this disease, have certain high-risk conditions, may be exposed to blood or other infectious body fluids, are household contacts or sex partners of hepatitis B positive people, are clients or workers in certain care facilities, or travel to or work in countries with a high rate of hepatitis B.  Haemophilus influenzae type b (Hib) vaccine. A previously unvaccinated person with asplenia or sickle cell disease or having a scheduled splenectomy should receive 1 dose of Hib vaccine. Regardless of previous immunization, a recipient of a hematopoietic stem cell transplant should receive a 3-dose series 6-12 months after his successful transplant. Hib vaccine is not recommended for adults with HIV infection. Preventive Service / Frequency Ages 52 to 17  Blood pressure check.** / Every 1 to 2 years.  Lipid and cholesterol check.** / Every 5 years beginning at age 69.  Hepatitis C blood test.** / For any individual with known risks for  hepatitis C.  Skin self-exam. / Monthly.  Influenza vaccine. / Every year.  Tetanus, diphtheria, and acellular pertussis (Tdap, Td) vaccine.** / Consult your health care provider. 1 dose of Td every 10 years.  Varicella vaccine.** / Consult your health care provider.  HPV vaccine. / 3 doses over 6 months, if 72 or younger.  Measles, mumps, rubella (MMR) vaccine.** / You need at least 1 dose of MMR if you were born in 1957 or later. You may also need a second dose.  Pneumococcal 13-valent conjugate (PCV13) vaccine.** / Consult your health care provider.  Pneumococcal polysaccharide (PPSV23) vaccine.** / 1 to 2 doses if you smoke cigarettes or if you have certain conditions.  Meningococcal vaccine.** / 1 dose if you are age 35 to 60 years and a Market researcher living in a residence hall, or have one of several medical conditions. You may also need additional booster doses.  Hepatitis A vaccine.** / Consult your health care provider.  Hepatitis B vaccine.** / Consult your health care provider.  Haemophilus influenzae type b (Hib) vaccine.** / Consult your health care provider. Ages 35 to 8  Blood pressure check.** / Every 1 to 2 years.  Lipid and cholesterol check.** / Every 5 years beginning at age 57.  Lung cancer screening. / Every year if you are aged 44-80 years and have a 30-pack-year history of smoking and currently smoke or have quit within the past 15 years. Yearly screening is stopped once you have quit smoking for at least 15 years or develop a health problem that would prevent you from having lung cancer treatment.  Fecal occult blood test (FOBT) of stool. / Every year beginning at age 55 and continuing until age 73. You may not have to do this test if you get a colonoscopy every 10 years.  Flexible sigmoidoscopy** or colonoscopy.** / Every 5 years for a flexible sigmoidoscopy or every 10 years for a colonoscopy beginning at age 28 and continuing until age  1.  Hepatitis C blood test.** / For all people born from [birth year] through 1965 and any individual with known risks for hepatitis C.  Skin self-exam. / Monthly.  Influenza vaccine. / Every  year.  Tetanus, diphtheria, and acellular pertussis (Tdap/Td) vaccine.** / Consult your health care provider. 1 dose of Td every 10 years.  Varicella vaccine.** / Consult your health care provider.  Zoster vaccine.** / 1 dose for adults aged 53 years or older.  Measles, mumps, rubella (MMR) vaccine.** / You need at least 1 dose of MMR if you were born in 1957 or later. You may also need a second dose.  Pneumococcal 13-valent conjugate (PCV13) vaccine.** / Consult your health care provider.  Pneumococcal polysaccharide (PPSV23) vaccine.** / 1 to 2 doses if you smoke cigarettes or if you have certain conditions.  Meningococcal vaccine.** / Consult your health care provider.  Hepatitis A vaccine.** / Consult your health care provider.  Hepatitis B vaccine.** / Consult your health care provider.  Haemophilus influenzae type b (Hib) vaccine.** / Consult your health care provider. Ages 77 and over  Blood pressure check.** / Every 1 to 2 years.  Lipid and cholesterol check.**/ Every 5 years beginning at age 85.  Lung cancer screening. / Every year if you are aged 55-80 years and have a 30-pack-year history of smoking and currently smoke or have quit within the past 15 years. Yearly screening is stopped once you have quit smoking for at least 15 years or develop a health problem that would prevent you from having lung cancer treatment.  Fecal occult blood test (FOBT) of stool. / Every year beginning at age 33 and continuing until age 11. You may not have to do this test if you get a colonoscopy every 10 years.  Flexible sigmoidoscopy** or colonoscopy.** / Every 5 years for a flexible sigmoidoscopy or every 10 years for a colonoscopy beginning at age 28 and continuing until age 73.  Hepatitis C blood  test.** / For all people born from [birth year] through 1967 and any individual with known risks for hepatitis C.  Abdominal aortic aneurysm (AAA) screening.** / A one-time screening for ages 50 to 27 years who are current or former smokers.  Skin self-exam. / Monthly.  Influenza vaccine. / Every year.  Tetanus, diphtheria, and acellular pertussis (Tdap/Td) vaccine.** / 1 dose of Td every 10 years.  Varicella vaccine.** / Consult your health care provider.  Zoster vaccine.** / 1 dose for adults aged 34 years or older.  Pneumococcal 13-valent conjugate (PCV13) vaccine.** / Consult your health care provider.  Pneumococcal polysaccharide (PPSV23) vaccine.** / 1 dose for all adults aged 63 years and older.  Meningococcal vaccine.** / Consult your health care provider.  Hepatitis A vaccine.** / Consult your health care provider.  Hepatitis B vaccine.** / Consult your health care provider.  Haemophilus influenzae type b (Hib) vaccine.** / Consult your health care provider. **Family history and personal history of risk and conditions may change your health care provider's recommendations. Document Released: 02/17/2001 Document Revised: 12/27/2012 Document Reviewed: 05/19/2010 [facility] Patient Information 2015 [place], [place]. This information is not intended to replace advice given to you by your health care provider. Make sure you discuss any questions you have with your health care provider.

## 2014-03-09 LAB — TESTOSTERONE, FREE, TOTAL, SHBG
Sex Hormone Binding: 47 nmol/L (ref 22–77)
TESTOSTERONE FREE: 70 pg/mL (ref 47.0–244.0)
Testosterone-% Free: 1.6 % (ref 1.6–2.9)
Testosterone: 429 ng/dL (ref 300–890)

## 2014-03-10 ENCOUNTER — Encounter: Payer: Self-pay | Admitting: Family Medicine

## 2014-03-14 ENCOUNTER — Other Ambulatory Visit: Payer: Self-pay

## 2014-03-14 DIAGNOSIS — R972 Elevated prostate specific antigen [PSA]: Secondary | ICD-10-CM

## 2014-04-21 ENCOUNTER — Other Ambulatory Visit: Payer: Self-pay | Admitting: Family Medicine

## 2014-04-23 ENCOUNTER — Other Ambulatory Visit: Payer: Self-pay | Admitting: Family Medicine

## 2014-08-13 ENCOUNTER — Other Ambulatory Visit (HOSPITAL_COMMUNITY): Payer: Self-pay

## 2014-08-13 DIAGNOSIS — M1711 Unilateral primary osteoarthritis, right knee: Secondary | ICD-10-CM

## 2014-08-22 ENCOUNTER — Ambulatory Visit (HOSPITAL_COMMUNITY)
Admission: RE | Admit: 2014-08-22 | Discharge: 2014-08-22 | Disposition: A | Discharge status: Home / Self Care | Payer: Medicare PPO | Source: Ambulatory Visit | Attending: Orthopedic Surgery | Admitting: Orthopedic Surgery

## 2014-08-22 DIAGNOSIS — M7122 Synovial cyst of popliteal space [Baker], left knee: Secondary | ICD-10-CM | POA: Insufficient documentation

## 2014-08-22 DIAGNOSIS — M1711 Unilateral primary osteoarthritis, right knee: Secondary | ICD-10-CM | POA: Diagnosis not present

## 2014-08-22 DIAGNOSIS — Z01818 Encounter for other preprocedural examination: Secondary | ICD-10-CM | POA: Diagnosis not present

## 2014-08-22 DIAGNOSIS — M25562 Pain in left knee: Secondary | ICD-10-CM | POA: Diagnosis not present

## 2014-09-11 ENCOUNTER — Ambulatory Visit (INDEPENDENT_AMBULATORY_CARE_PROVIDER_SITE_OTHER): Payer: Medicare PPO | Admitting: Family Medicine

## 2014-09-11 ENCOUNTER — Ambulatory Visit (HOSPITAL_BASED_OUTPATIENT_CLINIC_OR_DEPARTMENT_OTHER)
Admission: RE | Admit: 2014-09-11 | Discharge: 2014-09-11 | Disposition: A | Discharge status: Home / Self Care | Payer: Medicare PPO | Source: Ambulatory Visit | Attending: Family Medicine | Admitting: Family Medicine

## 2014-09-11 ENCOUNTER — Encounter: Payer: Self-pay | Admitting: Family Medicine

## 2014-09-11 ENCOUNTER — Ambulatory Visit: Payer: Medicare PPO | Admitting: Family Medicine

## 2014-09-11 VITALS — BP 116/72 | HR 76 | Temp 97.4°F | Ht 73.0 in | Wt 194.0 lb

## 2014-09-11 DIAGNOSIS — Z9889 Other specified postprocedural states: Secondary | ICD-10-CM | POA: Diagnosis not present

## 2014-09-11 DIAGNOSIS — Z87891 Personal history of nicotine dependence: Secondary | ICD-10-CM | POA: Insufficient documentation

## 2014-09-11 DIAGNOSIS — I10 Essential (primary) hypertension: Secondary | ICD-10-CM

## 2014-09-11 DIAGNOSIS — Z0000 Encounter for general adult medical examination without abnormal findings: Secondary | ICD-10-CM | POA: Diagnosis not present

## 2014-09-11 DIAGNOSIS — E785 Hyperlipidemia, unspecified: Secondary | ICD-10-CM | POA: Diagnosis not present

## 2014-09-11 DIAGNOSIS — Z01818 Encounter for other preprocedural examination: Secondary | ICD-10-CM | POA: Diagnosis not present

## 2014-09-11 DIAGNOSIS — Z01810 Encounter for preprocedural cardiovascular examination: Secondary | ICD-10-CM | POA: Diagnosis not present

## 2014-09-11 LAB — COMPREHENSIVE METABOLIC PANEL
ALBUMIN: 4.3 g/dL (ref 3.5–5.2)
ALK PHOS: 50 U/L (ref 39–117)
ALT: 16 U/L (ref 0–53)
AST: 22 U/L (ref 0–37)
BILIRUBIN TOTAL: 1.1 mg/dL (ref 0.2–1.2)
BUN: 21 mg/dL (ref 6–23)
CALCIUM: 9.4 mg/dL (ref 8.4–10.5)
CO2: 29 mEq/L (ref 19–32)
Chloride: 102 mEq/L (ref 96–112)
Creatinine, Ser: 0.96 mg/dL (ref 0.40–1.50)
GFR: 82.11 mL/min (ref >60.00)
Glucose, Bld: 102 mg/dL — ABNORMAL HIGH (ref 70–99)
Potassium: 4.8 mEq/L (ref 3.5–5.1)
Sodium: 139 mEq/L (ref 135–145)
TOTAL PROTEIN: 6.7 g/dL (ref 6.0–8.3)

## 2014-09-11 LAB — CBC WITH DIFFERENTIAL/PLATELET
BASOS PCT: 0.6 % (ref 0.0–3.0)
Basophils Absolute: 0 10*3/uL (ref 0.0–0.1)
Eosinophils Absolute: 0.3 10*3/uL (ref 0.0–0.7)
Eosinophils Relative: 3.4 % (ref 0.0–5.0)
HCT: 44.1 % (ref 39.0–52.0)
Hemoglobin: 14.9 g/dL (ref 13.0–17.0)
LYMPHS ABS: 2.3 10*3/uL (ref 0.7–4.0)
LYMPHS PCT: 29.8 % (ref 12.0–46.0)
MCHC: 33.8 g/dL (ref 30.0–36.0)
MCV: 93.4 fl (ref 78.0–100.0)
MONO ABS: 0.8 10*3/uL (ref 0.1–1.0)
MONOS PCT: 9.6 % (ref 3.0–12.0)
NEUTROS ABS: 4.4 10*3/uL (ref 1.4–7.7)
NEUTROS PCT: 56.6 % (ref 43.0–77.0)
PLATELETS: 206 10*3/uL (ref 150.0–400.0)
RBC: 4.72 Mil/uL (ref 4.22–5.81)
RDW: 14 % (ref 11.5–15.5)
WBC: 7.8 10*3/uL (ref 4.0–10.5)

## 2014-09-11 LAB — LIPID PANEL
CHOLESTEROL: 172 mg/dL (ref 0–200)
HDL: 57.5 mg/dL (ref >39.00)
LDL CALC: 93 mg/dL (ref 0–99)
NonHDL: 114.29
Total CHOL/HDL Ratio: 3
Triglycerides: 106 mg/dL (ref 0.0–149.0)
VLDL: 21.2 mg/dL (ref 0.0–40.0)

## 2014-09-11 LAB — VITAMIN D 25 HYDROXY (VIT D DEFICIENCY, FRACTURES): VITD: 53.24 ng/mL (ref 30.00–100.00)

## 2014-09-11 LAB — PSA: PSA: 4.14 ng/mL — AB (ref 0.10–4.00)

## 2014-09-11 NOTE — Patient Instructions (Signed)

## 2014-09-11 NOTE — Progress Notes (Signed)
Subjective:    Antonio Stephens is a 69 y.o. male who presents to the office today for a preoperative consultation at the request of surgeon Dr Nell Range who plans on performing Left uni compartmental knee replacement  on October 10 and R hip resurfacing 12/19/14.  This consultation is requested for the specific conditions prompting preoperative evaluation (i.e. because of potential affect on operative risk): age, cholesterol and htn.  . Planned anesthesia: general. The patient has the following known anesthesia issues: none. Patients bleeding risk: no recent abnormal bleeding. Patient does not have objections to receiving blood products if needed.  The following portions of the patient's history were reviewed and updated as appropriate:  He  has a past medical history of Hyperlipidemia; CVA (cerebral vascular accident); Depression; Back pain; and Hypertension. He  does not have any pertinent problems on file. 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 His family history includes Alzheimer's disease (age of onset: 55) in his mother; Breast cancer in his sister; Cancer (age of onset: 52) in his sister; Cancer (age of onset: 42) in his father; Hyperlipidemia in his paternal grandfather. There is no history of Colon cancer or Stomach cancer. He  reports that he quit smoking about 47 years ago. His smoking use included Cigarettes. He quit after .2 years of use. He has never used smokeless tobacco. He reports that he drinks about 4.2 - 6.0 oz of alcohol per week. He reports that he does not use illicit drugs. He has a current medication list which includes the following prescription(s): aspirin, calcium carbonate-vit d-min, cholecalciferol, crestor, denavir, garlic-calcium, glucosamine-chondroit-vit c-mn, lisinopril, multivitamin, omega 3, sertraline,  timolol, and turmeric. Current Outpatient Prescriptions on File Prior to Visit  Medication Sig Dispense Refill  . aspirin 81 MG tablet Take 81 mg by mouth daily.    . Calcium Carbonate-Vit D-Min (CALCIUM 1200 PO) Take 1 tablet by mouth 2 (two) times daily.      . cholecalciferol (VITAMIN D) 1000 UNITS tablet Take 1,000 Units by mouth 2 (two) times daily.      . CRESTOR 20 MG tablet TAKE 1 TABLET BY MOUTH DAILY 30 tablet 5  . DENAVIR 1 % cream Apply 1 application topically. As needed for cold sores    . Garlic-Calcium (BL GARLIC PO) Take 6,761 mg by mouth daily.    . Glucosamine-Chondroit-Vit C-Mn (GLUCOSAMINE CHONDR 1500 COMPLX PO) Take 1 tablet by mouth 2 (two) times daily.     Marland Kitchen lisinopril (PRINIVIL,ZESTRIL) 20 MG tablet TAKE 1 TABLET BY MOUTH EVERY DAY 30 tablet 11  . multivitamin (THERAGRAN) per tablet Take 1 tablet by mouth daily.      . Omega 3 1000 MG CAPS Take 1 capsule by mouth 2 (two) times daily.    . sertraline (ZOLOFT) 100 MG tablet Take 100 mg by mouth daily.      . timolol (TIMOPTIC) 0.5 % ophthalmic solution Place into the left eye.    . Turmeric 500 MG CAPS Take 1 capsule by mouth daily.     No current facility-administered medications on file prior to visit.   He has No Known Allergies..  Review of Systems Review of Systems  Constitutional: Negative for activity change, appetite change and fatigue.  HENT: Negative for hearing loss, congestion, tinnitus and ear discharge.  dentist q44mEyes: Negative for visual disturbance (see optho q1y -- vision corrected to  20/20 with glasses).  Respiratory: Negative for cough, chest tightness and shortness of breath.   Cardiovascular: Negative for chest pain, palpitations and leg swelling.  Gastrointestinal: Negative for abdominal pain, diarrhea, constipation and abdominal distention.  Genitourinary: Negative for urgency, frequency, decreased urine volume and difficulty urinating.  Musculoskeletal: Negative for back pain,  and gait  problem. --- hip and knee pain Skin: Negative for color change, pallor and rash.  Neurological: Negative for dizziness, light-headedness, numbness and headaches.  Hematological: Negative for adenopathy. Does not bruise/bleed easily.  Psychiatric/Behavioral: Negative for suicidal ideas, confusion, sleep disturbance, self-injury, dysphoric mood, decreased concentration and agitation.        Objective:    BP 116/72 mmHg  Pulse 76  Temp(Src) 97.4 F (36.3 C) (Oral)  Ht _0  (1.854 m)  Wt 194 lb (87.998 kg)  BMI 25.60 kg/m2  SpO2 97% General appearance: alert, cooperative, appears stated age and no distress Eyes: conjunctivae/corneas clear. PERRL, EOM's intact. Fundi benign. Ears: normal TM's and external ear canals both ears Nose: Nares normal. Septum midline. Mucosa normal. No drainage or sinus tenderness. Throat: lips, mucosa, and tongue normal; teeth and gums normal Neck: no adenopathy, no carotid bruit, no JVD, supple, symmetrical, trachea midline and thyroid not enlarged, symmetric, no tenderness/mass/nodules Lungs: clear to auscultation bilaterally Heart: regular rate and rhythm, S1, S2 normal, no murmur, click, rub or gallop Abdomen: soft, non-tender; bowel sounds normal; no masses,  no organomegaly Extremities: extremities normal, atraumatic, no cyanosis or edema Pulses: 2+ and symmetric Skin: Skin color, texture, turgor normal. No rashes or lesions Lymph nodes: Cervical, supraclavicular, and axillary nodes normal. Neurologic: Alert and oriented X 3, normal strength and tone. Normal symmetric reflexes. Normal coordination and gait  Cardiographics ECG: normal sinus rhythm, no blocks or conduction defects, no ischemic changes Echocardiogram: not done  Imaging Chest x-ray: normal   Lab Review  No visits with results within 6 Month(s) from this visit. Latest known visit with results is:  Office Visit on 03/08/2014  Component Date Value  . Sodium 03/08/2014 138   .  Potassium 03/08/2014 4.7   . Chloride 03/08/2014 102   . CO2 03/08/2014 32   . Glucose, Bld 03/08/2014 105*  . BUN 03/08/2014 23   . Creatinine, Ser 03/08/2014 1.05   . Calcium 03/08/2014 9.4   . GFR 03/08/2014 74.15   . WBC 03/08/2014 7.0   . RBC 03/08/2014 4.77   . Hemoglobin 03/08/2014 14.9   . HCT 03/08/2014 43.6   . MCV 03/08/2014 91.3   . MCHC 03/08/2014 34.3   . RDW 03/08/2014 13.7   . Platelets 03/08/2014 196.0   . Neutrophils Relative % 03/08/2014 57.3   . Lymphocytes Relative 03/08/2014 29.5   . Monocytes Relative 03/08/2014 9.2   . Eosinophils Relative 03/08/2014 3.4   . Basophils Relative 03/08/2014 0.6   . Neutro Abs 03/08/2014 4.0   . Lymphs Abs 03/08/2014 2.1   . Monocytes Absolute 03/08/2014 0.6   . Eosinophils Absolute 03/08/2014 0.2   . Basophils Absolute 03/08/2014 0.0   . Total Bilirubin 03/08/2014 0.8   . Bilirubin, Direct 03/08/2014 0.2   . Alkaline Phosphatase 03/08/2014 58   . AST 03/08/2014 18   . ALT 03/08/2014 14   . Total Protein 03/08/2014 6.4   . Albumin 03/08/2014 4.3   . Cholesterol 03/08/2014 164   . Triglycerides 03/08/2014 115.0   . HDL 03/08/2014 61.80   . VLDL 03/08/2014 23.0   . LDL Cholesterol 03/08/2014 79   .  Total CHOL/HDL Ratio 03/08/2014 3   . NonHDL 03/08/2014 102.20   . PSA 03/08/2014 4.12*  . Color, UA 03/08/2014 yellow   . Clarity, UA 03/08/2014 clear   . Glucose, UA 03/08/2014 neg   . Bilirubin, UA 03/08/2014 neg   . Ketones, UA 03/08/2014 neg   . Spec Grav, UA 03/08/2014 1.020   . Blood, UA 03/08/2014 neg   . pH, UA 03/08/2014 6.0   . Protein, UA 03/08/2014 neg   . Urobilinogen, UA 03/08/2014 0.2   . Nitrite, UA 03/08/2014 neg   . Leukocytes, UA 03/08/2014 Negative   . Vitamin B-12 03/08/2014 348   . Testosterone 03/08/2014 429   . Sex Hormone Binding 03/08/2014 47   . Testosterone, Free 03/08/2014 70.0   . Testosterone-% Free 03/08/2014 1.6       Assessment:      69 y.o. male with planned surgery as  above.   Known risk factors for perioperative complications: None   Difficulty with intubation is not anticipated.  Cardiac Risk Estimation: low      Plan:    1. Preoperative workup as follows chest x-ray, ECG, hemoglobin, hematocrit, electrolytes, creatinine, glucose, liver function studies. 2. Change in medication regimen before surgery: per surgical team. 3. Prophylaxis for cardiac events with perioperative beta-blockers: not indicated. 4. Invasive hemodynamic monitoring perioperatively: at the discretion of anesthesiologist. 5. Deep vein thrombosis prophylaxis postoperatively:regimen to be chosen by surgical team. 6. Surveillance for postoperative MI with ECG immediately postoperatively and on postoperative days 1 and 2 AND troponin levels 24 hours postoperatively and on day 4 or hospital discharge (whichever comes first): at the discretion of anesthesiologist. 7. Other measures: per surgical team    1. Pre-operative cardiovascular examination  - EKG 12-Lead - CBC with Differential/Platelet  2. Hyperlipidemia Check labs con't lipitor - Comp Met (CMET) - CBC with Differential/Platelet - Lipid panel - POCT urinalysis dipstick - PSA - Vitamin D (25 hydroxy)  3. Pre-op evaluation Labs and imaging per orders from DR Gross - Comp Met (CMET) - CBC with Differential/Platelet - Lipid panel - POCT urinalysis dipstick - PSA - Vitamin D (25 hydroxy) - Prealbumin; Future - DG Chest 2 View; Future  4. Essential hypertension stable  5. Preventative health care   - Hepatitis C antibody

## 2014-09-11 NOTE — Progress Notes (Signed)
Pre visit review using our clinic review tool, if applicable. No additional management support is needed unless otherwise documented below in the visit note. 

## 2014-09-12 DIAGNOSIS — Z01818 Encounter for other preprocedural examination: Secondary | ICD-10-CM | POA: Diagnosis not present

## 2014-09-12 LAB — HEPATITIS C ANTIBODY: HCV AB: NEGATIVE

## 2014-09-12 NOTE — Addendum Note (Signed)
Addended by: Harl Bowie on: 09/12/2014 08:22 AM   Modules accepted: Orders

## 2014-09-13 LAB — PREALBUMIN: PREALBUMIN: 32 mg/dL (ref 21–43)

## 2014-09-20 ENCOUNTER — Other Ambulatory Visit (HOSPITAL_COMMUNITY): Payer: Self-pay

## 2014-10-03 ENCOUNTER — Telehealth: Payer: Self-pay | Admitting: Family Medicine

## 2014-10-03 ENCOUNTER — Other Ambulatory Visit: Payer: Self-pay | Admitting: Family Medicine

## 2014-10-03 NOTE — Telephone Encounter (Signed)
Pt said that he needs to have a bone density scan done prior to the surgery. He has order from the surgeon so I transferred him to imaging but he was told that we can only do a full body bone density scan. His surgery is scheduled for 10/15/14. They need a new order to do it here at [facility].

## 2014-10-03 NOTE — Telephone Encounter (Signed)
I need dx code on order

## 2014-10-03 NOTE — Telephone Encounter (Signed)
Please advise 

## 2014-10-03 NOTE — Telephone Encounter (Signed)
error 

## 2014-10-04 NOTE — Telephone Encounter (Signed)
Left message for patient to return the call.

## 2014-10-15 DIAGNOSIS — M179 Osteoarthritis of knee, unspecified: Secondary | ICD-10-CM | POA: Diagnosis not present

## 2014-10-15 DIAGNOSIS — Z96652 Presence of left artificial knee joint: Secondary | ICD-10-CM | POA: Diagnosis not present

## 2014-10-15 DIAGNOSIS — M25562 Pain in left knee: Secondary | ICD-10-CM | POA: Diagnosis not present

## 2014-10-15 DIAGNOSIS — Z8673 Personal history of transient ischemic attack (TIA), and cerebral infarction without residual deficits: Secondary | ICD-10-CM | POA: Diagnosis not present

## 2014-10-15 DIAGNOSIS — Z96651 Presence of right artificial knee joint: Secondary | ICD-10-CM | POA: Diagnosis not present

## 2014-10-15 DIAGNOSIS — Z8619 Personal history of other infectious and parasitic diseases: Secondary | ICD-10-CM | POA: Diagnosis not present

## 2014-10-15 DIAGNOSIS — G8918 Other acute postprocedural pain: Secondary | ICD-10-CM | POA: Diagnosis not present

## 2014-10-15 DIAGNOSIS — Z96642 Presence of left artificial hip joint: Secondary | ICD-10-CM | POA: Diagnosis not present

## 2014-10-15 HISTORY — PX: PARTIAL KNEE ARTHROPLASTY: SHX2174

## 2014-10-16 DIAGNOSIS — Z8619 Personal history of other infectious and parasitic diseases: Secondary | ICD-10-CM | POA: Diagnosis not present

## 2014-10-16 DIAGNOSIS — Z96642 Presence of left artificial hip joint: Secondary | ICD-10-CM | POA: Diagnosis not present

## 2014-10-16 DIAGNOSIS — M179 Osteoarthritis of knee, unspecified: Secondary | ICD-10-CM | POA: Diagnosis not present

## 2014-10-16 DIAGNOSIS — Z96651 Presence of right artificial knee joint: Secondary | ICD-10-CM | POA: Diagnosis not present

## 2014-10-16 DIAGNOSIS — Z8673 Personal history of transient ischemic attack (TIA), and cerebral infarction without residual deficits: Secondary | ICD-10-CM | POA: Diagnosis not present

## 2014-10-18 DIAGNOSIS — M1712 Unilateral primary osteoarthritis, left knee: Secondary | ICD-10-CM | POA: Diagnosis not present

## 2014-10-19 DIAGNOSIS — M1712 Unilateral primary osteoarthritis, left knee: Secondary | ICD-10-CM | POA: Diagnosis not present

## 2014-10-22 DIAGNOSIS — M1712 Unilateral primary osteoarthritis, left knee: Secondary | ICD-10-CM | POA: Diagnosis not present

## 2014-10-23 ENCOUNTER — Telehealth: Payer: Self-pay | Admitting: Family Medicine

## 2014-10-23 NOTE — Telephone Encounter (Signed)
I'm wondering if it could be from something given during surgry but we are happy to see him if he wants to come in

## 2014-10-23 NOTE — Telephone Encounter (Signed)
Please advise      KP 

## 2014-10-23 NOTE — Telephone Encounter (Signed)
MSG left to call the office      KP 

## 2014-10-23 NOTE — Telephone Encounter (Signed)
Can be reached: [phone]  Reason for call: Thru the night pt having headaches, in the morning he is having nausea. He dry heaves a few times and then after he is up moving he feels ok gradually. This has been since he had his surgery (about a week ago). He called surgeons office as well but was advised by rehab to contact both offices. Pt requesting call back.

## 2014-10-24 DIAGNOSIS — M1712 Unilateral primary osteoarthritis, left knee: Secondary | ICD-10-CM | POA: Diagnosis not present

## 2014-10-25 DIAGNOSIS — M1712 Unilateral primary osteoarthritis, left knee: Secondary | ICD-10-CM | POA: Diagnosis not present

## 2014-10-27 ENCOUNTER — Other Ambulatory Visit: Payer: Self-pay | Admitting: Family Medicine

## 2014-10-29 DIAGNOSIS — M1712 Unilateral primary osteoarthritis, left knee: Secondary | ICD-10-CM | POA: Diagnosis not present

## 2014-10-30 NOTE — Telephone Encounter (Signed)
Patient did speak with the surgeon, he said he only called because his physical therapist suggested it, but he did get my message the other day.     KP

## 2014-10-31 DIAGNOSIS — M1712 Unilateral primary osteoarthritis, left knee: Secondary | ICD-10-CM | POA: Diagnosis not present

## 2014-11-01 DIAGNOSIS — R972 Elevated prostate specific antigen [PSA]: Secondary | ICD-10-CM | POA: Diagnosis not present

## 2014-11-01 LAB — PSA: PSA: 1.83

## 2014-11-02 DIAGNOSIS — M1712 Unilateral primary osteoarthritis, left knee: Secondary | ICD-10-CM | POA: Diagnosis not present

## 2014-11-05 DIAGNOSIS — R972 Elevated prostate specific antigen [PSA]: Secondary | ICD-10-CM | POA: Diagnosis not present

## 2014-11-06 DIAGNOSIS — H409 Unspecified glaucoma: Secondary | ICD-10-CM | POA: Diagnosis not present

## 2014-11-06 DIAGNOSIS — H02839 Dermatochalasis of unspecified eye, unspecified eyelid: Secondary | ICD-10-CM | POA: Diagnosis not present

## 2014-11-06 DIAGNOSIS — I10 Essential (primary) hypertension: Secondary | ICD-10-CM | POA: Diagnosis not present

## 2014-11-06 DIAGNOSIS — H2513 Age-related nuclear cataract, bilateral: Secondary | ICD-10-CM | POA: Diagnosis not present

## 2014-11-07 DIAGNOSIS — M1712 Unilateral primary osteoarthritis, left knee: Secondary | ICD-10-CM | POA: Diagnosis not present

## 2014-11-09 DIAGNOSIS — M1712 Unilateral primary osteoarthritis, left knee: Secondary | ICD-10-CM | POA: Diagnosis not present

## 2014-11-12 DIAGNOSIS — M1712 Unilateral primary osteoarthritis, left knee: Secondary | ICD-10-CM | POA: Diagnosis not present

## 2014-11-14 DIAGNOSIS — M1712 Unilateral primary osteoarthritis, left knee: Secondary | ICD-10-CM | POA: Diagnosis not present

## 2014-11-16 DIAGNOSIS — H468 Other optic neuritis: Secondary | ICD-10-CM | POA: Diagnosis not present

## 2014-11-23 ENCOUNTER — Other Ambulatory Visit: Payer: Self-pay | Admitting: Ophthalmology

## 2014-11-23 DIAGNOSIS — H47019 Ischemic optic neuropathy, unspecified eye: Secondary | ICD-10-CM

## 2014-12-04 DIAGNOSIS — M1712 Unilateral primary osteoarthritis, left knee: Secondary | ICD-10-CM | POA: Diagnosis not present

## 2014-12-04 DIAGNOSIS — M25562 Pain in left knee: Secondary | ICD-10-CM | POA: Diagnosis not present

## 2014-12-10 ENCOUNTER — Telehealth: Payer: Self-pay | Admitting: Family Medicine

## 2014-12-10 NOTE — Telephone Encounter (Signed)
Caller name: Baker Janus  Relationship to patient: [facility]  Can be reached: [phone]  Reason for call: Patient has appt for MRI on Wednesday 12/7 @ 5:30 pm and needs pre-auth for [employer]. Please call Baker Janus once authorized.

## 2014-12-10 NOTE — Telephone Encounter (Signed)
I do not know what this was ordered for and don't see any office notes from the eye doctor.  I cannot get this approved as I have no information to supply for the prior authorization.  This may need to wait until Dr Etter Sjogren returns unless we get more information to proceed

## 2014-12-10 NOTE — Telephone Encounter (Signed)
FYI

## 2014-12-10 NOTE — Telephone Encounter (Signed)
Spoke with Baker Janus w/ [facility]/she will try and obtain records form Eye dr and send to our office

## 2014-12-10 NOTE — Telephone Encounter (Signed)
Our office is not the ordering dr for MRI, patients eye dr is requesting we try and obtain auth for MRI of [place] w and w/o contrast. Their office was unable to obtain auth, denied. Please advise

## 2014-12-12 ENCOUNTER — Other Ambulatory Visit: Payer: Medicare PPO

## 2014-12-20 ENCOUNTER — Ambulatory Visit
Admission: RE | Admit: 2014-12-20 | Discharge: 2014-12-20 | Disposition: A | Discharge status: Home / Self Care | Payer: Medicare PPO | Source: Ambulatory Visit | Attending: Ophthalmology | Admitting: Ophthalmology

## 2014-12-20 DIAGNOSIS — H47019 Ischemic optic neuropathy, unspecified eye: Secondary | ICD-10-CM

## 2014-12-20 DIAGNOSIS — H469 Unspecified optic neuritis: Secondary | ICD-10-CM | POA: Diagnosis not present

## 2014-12-20 MED ORDER — GADOBENATE DIMEGLUMINE 529 MG/ML IV SOLN
17.0000 mL | Freq: Once | INTRAVENOUS | Status: AC | PRN
  Administered 2014-12-20: 17 mL via INTRAVENOUS

## 2014-12-27 ENCOUNTER — Other Ambulatory Visit: Payer: Medicare PPO

## 2015-03-08 ENCOUNTER — Telehealth: Payer: Self-pay | Admitting: *Deleted

## 2015-03-08 ENCOUNTER — Encounter: Payer: Self-pay | Admitting: *Deleted

## 2015-03-08 NOTE — Telephone Encounter (Signed)
Pre-Visit Call completed with patient and chart updated.   Pre-Visit Info documented in Specialty Comments under [employer].    

## 2015-03-11 ENCOUNTER — Ambulatory Visit (INDEPENDENT_AMBULATORY_CARE_PROVIDER_SITE_OTHER): Payer: Medicare PPO | Admitting: Family Medicine

## 2015-03-11 ENCOUNTER — Encounter: Payer: Self-pay | Admitting: Family Medicine

## 2015-03-11 VITALS — BP 118/74 | HR 74 | Temp 98.1°F | Ht 73.0 in | Wt 193.8 lb

## 2015-03-11 DIAGNOSIS — Z0000 Encounter for general adult medical examination without abnormal findings: Secondary | ICD-10-CM

## 2015-03-11 DIAGNOSIS — I10 Essential (primary) hypertension: Secondary | ICD-10-CM

## 2015-03-11 DIAGNOSIS — E785 Hyperlipidemia, unspecified: Secondary | ICD-10-CM | POA: Diagnosis not present

## 2015-03-11 DIAGNOSIS — R972 Elevated prostate specific antigen [PSA]: Secondary | ICD-10-CM

## 2015-03-11 LAB — CBC WITH DIFFERENTIAL/PLATELET
BASOS PCT: 0.5 % (ref 0.0–3.0)
Basophils Absolute: 0.1 10*3/uL (ref 0.0–0.1)
EOS PCT: 3 % (ref 0.0–5.0)
Eosinophils Absolute: 0.3 10*3/uL (ref 0.0–0.7)
HEMATOCRIT: 46.9 % (ref 39.0–52.0)
HEMOGLOBIN: 15.8 g/dL (ref 13.0–17.0)
Lymphocytes Relative: 28.4 % (ref 12.0–46.0)
Lymphs Abs: 3.1 10*3/uL (ref 0.7–4.0)
MCHC: 33.7 g/dL (ref 30.0–36.0)
MCV: 92 fl (ref 78.0–100.0)
MONO ABS: 1 10*3/uL (ref 0.1–1.0)
MONOS PCT: 9.1 % (ref 3.0–12.0)
Neutro Abs: 6.5 10*3/uL (ref 1.4–7.7)
Neutrophils Relative %: 59 % (ref 43.0–77.0)
Platelets: 238 10*3/uL (ref 150.0–400.0)
RBC: 5.1 Mil/uL (ref 4.22–5.81)
RDW: 14.4 % (ref 11.5–15.5)
WBC: 10.9 10*3/uL — AB (ref 4.0–10.5)

## 2015-03-11 LAB — COMPREHENSIVE METABOLIC PANEL
ALBUMIN: 4.5 g/dL (ref 3.5–5.2)
ALT: 19 U/L (ref 0–53)
AST: 21 U/L (ref 0–37)
Alkaline Phosphatase: 64 U/L (ref 39–117)
BUN: 26 mg/dL — AB (ref 6–23)
CHLORIDE: 101 meq/L (ref 96–112)
CO2: 30 mEq/L (ref 19–32)
Calcium: 9.7 mg/dL (ref 8.4–10.5)
Creatinine, Ser: 1.05 mg/dL (ref 0.40–1.50)
GFR: 73.93 mL/min (ref >60.00)
Glucose, Bld: 108 mg/dL — ABNORMAL HIGH (ref 70–99)
POTASSIUM: 5.3 meq/L — AB (ref 3.5–5.1)
SODIUM: 138 meq/L (ref 135–145)
Total Bilirubin: 0.7 mg/dL (ref 0.2–1.2)
Total Protein: 6.9 g/dL (ref 6.0–8.3)

## 2015-03-11 LAB — LIPID PANEL
CHOLESTEROL: 198 mg/dL (ref 0–200)
HDL: 64.2 mg/dL (ref >39.00)
LDL Cholesterol: 113 mg/dL — ABNORMAL HIGH (ref 0–99)
NONHDL: 133.8
Total CHOL/HDL Ratio: 3
Triglycerides: 103 mg/dL (ref 0.0–149.0)
VLDL: 20.6 mg/dL (ref 0.0–40.0)

## 2015-03-11 LAB — POCT URINALYSIS DIPSTICK
Bilirubin, UA: NEGATIVE
Blood, UA: NEGATIVE
Glucose, UA: NEGATIVE
KETONES UA: NEGATIVE
LEUKOCYTES UA: NEGATIVE
NITRITE UA: NEGATIVE
PH UA: 6
PROTEIN UA: NEGATIVE
Spec Grav, UA: >=1.03
UROBILINOGEN UA: 0.2

## 2015-03-11 LAB — PSA: PSA: 5.62 ng/mL — AB (ref 0.10–4.00)

## 2015-03-11 NOTE — Progress Notes (Signed)
Pre visit review using our clinic review tool, if applicable. No additional management support is needed unless otherwise documented below in the visit note. 

## 2015-03-11 NOTE — Progress Notes (Signed)
Subjective:   Antonio Stephens is a 70 y.o. male who presents for Medicare Annual/Subsequent preventive examination. Pt wants to discuss urology appointment.  His psa came down to 1.8 from 4 and pt thinks it may have been the abx given in hosp for his knee surgery.  We will recheck it today.    He also c/o coughing with vinager or other certain foods.  If it hit a certain parts of his mouth/ throat it causes him to cough.  No sore throat or other symptoms.    Review of Systems:    Review of Systems  Constitutional: Negative for activity change, appetite change and fatigue.  HENT: Negative for hearing loss, congestion, tinnitus and ear discharge.   Eyes: Negative for visual disturbance (see optho q1y -- vision corrected to 20/20 with glasses).  Respiratory: Negative for cough, chest tightness and shortness of breath.   Cardiovascular: Negative for chest pain, palpitations and leg swelling.  Gastrointestinal: Negative for abdominal pain, diarrhea, constipation and abdominal distention.  Genitourinary: Negative for urgency, frequency, decreased urine volume and difficulty urinating.  Musculoskeletal: Negative for back pain, arthralgias and gait problem.  Skin: Negative for color change, pallor and rash.  Neurological: Negative for dizziness, light-headedness, numbness and headaches.  Hematological: Negative for adenopathy. Does not bruise/bleed easily.  Psychiatric/Behavioral: Negative for suicidal ideas, confusion, sleep disturbance, self-injury, dysphoric mood, decreased concentration and agitation.  Pt is able to read and write and can do all ADLs No risk for falling No abuse/ violence in home       Objective:    Vitals: BP 118/74 mmHg  Pulse 74  Temp(Src) 98.1 F (36.7 C) (Oral)  Ht '6\' 1"'  (1.854 m)  Wt 193 lb 12.8 oz (87.907 kg)  BMI 25.57 kg/m2  SpO2 97% BP 118/74 mmHg  Pulse 74  Temp(Src) 98.1 F (36.7 C) (Oral)  Ht '6\' 1"'  (1.854 m)  Wt 193 lb 12.8 oz (87.907 kg)   BMI 25.57 kg/m2  SpO2 97%  BP 118/74 mmHg  Pulse 74  Temp(Src) 98.1 F (36.7 C) (Oral)  Ht '6\' 1"'  (1.854 m)  Wt 193 lb 12.8 oz (87.907 kg)  BMI 25.57 kg/m2  SpO2 97% General appearance: alert, cooperative, appears stated age and no distress Head: Normocephalic, without obvious abnormality, atraumatic Eyes: conjunctivae/corneas clear. PERRL, EOM's intact. Fundi benign. Ears: normal TM's and external ear canals both ears Nose: Nares normal. Septum midline. Mucosa normal. No drainage or sinus tenderness. Throat: lips, mucosa, and tongue normal; teeth and gums normal Neck: no adenopathy, no carotid bruit, no JVD, supple, symmetrical, trachea midline and thyroid not enlarged, symmetric, no tenderness/mass/nodules Back: symmetric, no curvature. ROM normal. No CVA tenderness. Lungs: clear to auscultation bilaterally Chest wall: no tenderness Heart: regular rate and rhythm, S1, S2 normal, no murmur, click, rub or gallop Abdomen: soft, non-tender; bowel sounds normal; no masses,  no organomegaly Male genitalia: penis: no lesions or discharge. testes: no masses or tenderness. no hernias Rectal: soft brown guaiac negative stool noted and enlarged prostate, smooth Extremities: extremities normal, atraumatic, no cyanosis or edema Pulses: 2+ and symmetric Skin: Skin color, texture, turgor normal. No rashes or lesions Lymph nodes: Cervical, supraclavicular, and axillary nodes normal. Neurologic: Alert and oriented X 3, normal strength and tone. Normal symmetric reflexes. Normal coordination and gait Psych- no depression, no anxiety Tobacco History  Smoking status  . Former Smoker -- .2 years  . Types: Cigarettes  . Quit date: 09/15/1967  Smokeless tobacco  . Never Used  Counseling given: Not Answered   Past Medical History  Diagnosis Date  . Hyperlipidemia   . 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 Depression   . Back pain   . Hypertension   . Glaucoma     bilateral   Past Surgical History  Procedure Laterality Date  . Knee arthroscopy  04/2005    right  . Inguinal herniorrhaphy      left  . 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      benign/fatty tumor  . Total hip arthroplasty  12/16/09    left hip  . Knee surgery  09-2009    partial replacemente , R  . Colonoscopy    . Partial knee arthroplasty Left 10.10.16   Family History  Problem Relation Age of Onset  . Alzheimer's disease Mother 90  . Cancer Father 71    sm cell carcinoma lung  . Cancer Sister 15    breast  . Breast cancer Sister   . Hyperlipidemia Paternal Grandfather   . Colon cancer Neg Hx   . Stomach cancer Neg Hx    History  Sexual Activity  . Sexual Activity: Yes    Outpatient Encounter Prescriptions as of 03/11/2015  Medication Sig  . aspirin 81 MG tablet Take 81 mg by mouth daily.  . Calcium Carbonate-Vit D-Min (CALCIUM 1200 PO) Take 1 tablet by mouth 2 (two) times daily.    . cholecalciferol (VITAMIN D) 1000 UNITS tablet Take 1,000 Units by mouth 2 (two) times daily.    . Garlic-Calcium (BL GARLIC PO) Take 6,222 mg by mouth daily.  . Glucosamine-Chondroit-Vit C-Mn (GLUCOSAMINE CHONDR 1500 COMPLX PO) Take 1 tablet by mouth 2 (two) times daily.   Marland Kitchen lisinopril (PRINIVIL,ZESTRIL) 20 MG tablet TAKE 1 TABLET BY MOUTH EVERY DAY  . multivitamin (THERAGRAN) per tablet Take 1 tablet by mouth daily.    . Omega 3 1000 MG CAPS Take 1 capsule by mouth 2 (two) times daily.  . rosuvastatin (CRESTOR) 20 MG tablet TAKE 1 TABLET BY MOUTH DAILY  . sertraline (ZOLOFT) 100 MG tablet Take 100 mg by mouth daily.    . timolol (TIMOPTIC) 0.5 % ophthalmic solution Place into the left eye.  . Turmeric 500 MG CAPS Take 1 capsule by mouth daily.   No facility-administered encounter medications on file as of 03/11/2015.    Activities of Daily Living In your present state of health, do you have any difficulty performing the following  activities: 03/11/2015  Hearing? N  Vision? N  Difficulty concentrating or making decisions? N  Walking or climbing stairs? N  Dressing or bathing? N  Doing errands, shopping? N    Patient Care Team: Rosalita Chessman, DO as PCP - General Dorann Ou, MD as Consulting Physician (Ophthalmology) Marcina Millard, MD as Consulting Physician (Orthopedic Surgery) Ardis Hughs, MD as Consulting Physician (Urology)   Assessment:    cpe: Exercise Activities and Dietary recommendations--con't current exercise- weights , trainer     Goals    None     Fall Risk Fall Risk  03/11/2015 03/08/2014 02/02/2013  Falls in the past year? No No No   Depression Screen PHQ 2/9 Scores 03/11/2015 03/08/2014 02/02/2013  PHQ - 2 Score 0 0 0    Cognitive Testing mmse 30/30  Immunization History  Administered Date(s) Administered  . Pneumococcal Conjugate-13 03/08/2014  . Pneumococcal Polysaccharide-23 09/05/2009  . Td  09/05/2009  . Zoster 12/10/2010   Screening Tests Health Maintenance  Topic Date Due  . INFLUENZA VACCINE  03/10/2016 (Originally 08/06/2014)  . COLONOSCOPY  06/07/2018  . TETANUS/TDAP  09/06/2019  . ZOSTAVAX  Completed  . Hepatitis C Screening  Completed  . PNA vac Low Risk Adult  Completed      Plan:   see avs During the course of the visit the patient was educated and counseled about the following appropriate screening and preventive services:   Vaccines to include Pneumoccal, Influenza, Hepatitis B, Td, Zostavax, HCV  Electrocardiogram  Cardiovascular Disease  Colorectal cancer screening  Diabetes screening  Prostate Cancer Screening  Glaucoma screening  Nutrition counseling   Smoking cessation counseling  Patient Instructions (the written plan) was given to the patient.  1. Elevated PSA   - CBC with Differential/Platelet - PSA  2. Hyperlipidemia LDL goal <100 con't crestor - Comp Met (CMET) - CBC with Differential/Platelet - Lipid panel - POCT  urinalysis dipstick  3. Essential hypertension con't lisinopril  - Comp Met (CMET) - CBC with Differential/Platelet - POCT urinalysis dipstick  4. Preventative health care     Garnet Koyanagi, DO  03/11/2015

## 2015-03-11 NOTE — Patient Instructions (Signed)

## 2015-03-17 ENCOUNTER — Encounter: Payer: Self-pay | Admitting: Family Medicine

## 2015-03-17 DIAGNOSIS — R972 Elevated prostate specific antigen [PSA]: Secondary | ICD-10-CM

## 2015-03-18 NOTE — Telephone Encounter (Signed)
Please advise if it is ok to make this referral.    KP

## 2015-03-18 NOTE — Telephone Encounter (Signed)
If pt is established there he can make the appointment --- othewise refer to urology and infor marj or jen

## 2015-04-15 DIAGNOSIS — R972 Elevated prostate specific antigen [PSA]: Secondary | ICD-10-CM | POA: Diagnosis not present

## 2015-04-15 DIAGNOSIS — Z79899 Other long term (current) drug therapy: Secondary | ICD-10-CM | POA: Diagnosis not present

## 2015-05-04 ENCOUNTER — Other Ambulatory Visit: Payer: Self-pay | Admitting: Family Medicine

## 2015-05-27 ENCOUNTER — Ambulatory Visit (INDEPENDENT_AMBULATORY_CARE_PROVIDER_SITE_OTHER): Payer: Medicare PPO | Admitting: Medical

## 2015-05-27 ENCOUNTER — Encounter: Payer: Self-pay | Admitting: Medical

## 2015-05-27 VITALS — BP 116/78 | HR 78 | Temp 98.1°F | Ht 73.0 in | Wt 195.0 lb

## 2015-05-27 DIAGNOSIS — R059 Cough, unspecified: Secondary | ICD-10-CM

## 2015-05-27 DIAGNOSIS — J309 Allergic rhinitis, unspecified: Secondary | ICD-10-CM

## 2015-05-27 DIAGNOSIS — K219 Gastro-esophageal reflux disease without esophagitis: Secondary | ICD-10-CM | POA: Diagnosis not present

## 2015-05-27 DIAGNOSIS — R05 Cough: Secondary | ICD-10-CM | POA: Diagnosis not present

## 2015-05-27 MED ORDER — BENZONATATE 100 MG PO CAPS: 100.0000 mg | ORAL_CAPSULE | Freq: Three times a day (TID) | ORAL | Status: DC | PRN

## 2015-05-27 MED ORDER — RANITIDINE HCL 150 MG PO CAPS: 150.0000 mg | ORAL_CAPSULE | Freq: Two times a day (BID) | ORAL | Status: DC

## 2015-05-27 MED ORDER — AZITHROMYCIN 250 MG PO TABS: ORAL_TABLET | ORAL | Status: DC

## 2015-05-27 MED ORDER — FLUTICASONE PROPIONATE 50 MCG/ACT NA SUSP
2.0000 | Freq: Every day | NASAL | Status: DC
Start: 2015-05-27 — End: 2015-09-23

## 2015-05-27 NOTE — Progress Notes (Signed)
Pre visit review using our clinic review tool, if applicable. No additional management support is needed unless otherwise documented below in the visit note. 

## 2015-05-27 NOTE — Patient Instructions (Addendum)
You may have had recent uri vs allergic rhinitis. Rx flonase nasal spray and benzonatate for cough.  I am making azithromycin available if despite above treatment you have sinus infection or bronchitis symptoms.  Your cough that has occurred for a while before recent illness and after eating could be reflux related. Would rx trial of ranitidine. If this does not help then may consider GI referral.   Follow up in 3 wks or as needed

## 2015-05-27 NOTE — Progress Notes (Signed)
Subjective:    Patient ID: Antonio Stephens, male    DOB: 1945/11/14, 69 y.o.   MRN: [id number]  HPI  Pt in for eval. Pt states uri type symptoms.with sneezing and itching eyes He had this for 2 weeks. On Friday he made appointment for today. Pt was worried about his sinuses. Pt took some mucinex last week. Now not on mucinex but feels better over all.  This morning was coughing on awakening. Mild producutive cough.    Pt states also sometimes at times with eating will cough(going on for months). He thinks back of throat sensitive and then will cough. Use to be with vinegar at first but now with other types of foods. No comments of any esophagus stenosis on transesophageal echo in 2011.  Review of Systems  Constitutional: Negative for fever, chills and fatigue.  HENT: Positive for congestion, postnasal drip and sneezing. Negative for drooling, ear pain and nosebleeds.   Respiratory: Positive for cough. Negative for choking, chest tightness, shortness of breath and wheezing.        Now and at times with eating in the past.  Cardiovascular: Negative for chest pain and palpitations.  Gastrointestinal: Negative for nausea, abdominal pain, diarrhea, constipation, blood in stool and abdominal distention.  Genitourinary: Negative for dysuria and flank pain.  Musculoskeletal: Negative for back pain.  Skin: Negative for rash.  Neurological: Negative for dizziness, syncope, weakness and headaches.  Hematological: Negative for adenopathy. Does not bruise/bleed easily.  Psychiatric/Behavioral: Negative for behavioral problems and confusion.    Past Medical History  Diagnosis Date  . Hyperlipidemia   . 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 Depression   . Back pain   . Hypertension   . Glaucoma     bilateral     Social History   Social History  . Marital Status: Married    Spouse Name: N/A  .  Number of Children: N/A  . Years of Education: N/A   Occupational History  . Sales textiles    Social History Main Topics  . Smoking status: Former Smoker -- .2 years    Types: Cigarettes    Quit date: 09/15/1967  . Smokeless tobacco: Never Used  . Alcohol Use: 4.2 - 6.0 oz/week    7-10 Glasses of wine per week     Comment: sometimes nightly  . Drug Use: No  . Sexual Activity: Yes   Other Topics Concern  . Not on file   Social History Narrative    Past Surgical History  Procedure Laterality Date  . Knee arthroscopy  04/2005    right  . Inguinal herniorrhaphy      left  . 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      benign/fatty tumor  . Total hip arthroplasty  12/16/09    left hip  . Knee surgery  09-2009    partial replacemente , R  . Colonoscopy    . Partial knee arthroplasty Left 10.10.16    Family History  Problem Relation Age of Onset  . Alzheimer's disease Mother 64  . Cancer Father 20    sm cell carcinoma lung  . Cancer Sister 38    breast  . Breast cancer Sister   . Hyperlipidemia Paternal Grandfather   . Colon cancer Neg Hx   . Stomach cancer Neg Hx     No Known Allergies  Current Outpatient Prescriptions on File Prior  to Visit  Medication Sig Dispense Refill  . aspirin 81 MG tablet Take 81 mg by mouth daily.    . Calcium Carbonate-Vit D-Min (CALCIUM 1200 PO) Take 1 tablet by mouth 2 (two) times daily.      . cholecalciferol (VITAMIN D) 1000 UNITS tablet Take 1,000 Units by mouth 2 (two) times daily.      . Garlic-Calcium (BL GARLIC PO) Take 99991111 mg by mouth daily.    . Glucosamine-Chondroit-Vit C-Mn (GLUCOSAMINE CHONDR 1500 COMPLX PO) Take 1 tablet by mouth 2 (two) times daily.     Marland Kitchen lisinopril (PRINIVIL,ZESTRIL) 20 MG tablet TAKE 1 TABLET BY MOUTH EVERY DAY 30 tablet 5  . multivitamin (THERAGRAN) per tablet Take 1 tablet by mouth daily.      . Omega 3 1000 MG CAPS Take 1 capsule by mouth 2 (two) times daily.    . rosuvastatin (CRESTOR) 20 MG tablet TAKE 1  TABLET BY MOUTH DAILY 30 tablet 5  . sertraline (ZOLOFT) 100 MG tablet Take 100 mg by mouth daily.      . timolol (TIMOPTIC) 0.5 % ophthalmic solution Place into the left eye.    . Turmeric 500 MG CAPS Take 1 capsule by mouth daily.     No current facility-administered medications on file prior to visit.    BP 116/78 mmHg  Pulse 78  Temp(Src) 98.1 F (36.7 C) (Oral)  Ht 6\' 1"  (1.854 m)  Wt 195 lb (88.451 kg)  BMI 25.73 kg/m2  SpO2 98%       Objective:   Physical Exam  General  Mental Status - Alert. General Appearance - Well groomed. Not in acute distress.  Skin Rashes- No Rashes.  HEENT Head- Normal. Ear Auditory Canal - Left- Normal. Right - Normal.Tympanic Membrane- Left- Normal. Right- Normal. Eye Sclera/Conjunctiva- Left- Normal. Right- Normal. Nose & Sinuses Nasal Mucosa- Left-  Boggy and Congested. Right-  Boggy and  Congested.Bilateral  No maxillary and no  frontal sinus pressure. Mouth & Throat Lips: Upper Lip- Normal: no dryness, cracking, pallor, cyanosis, or vesicular eruption. Lower Lip-Normal: no dryness, cracking, pallor, cyanosis or vesicular eruption. Buccal Mucosa- Bilateral- No Aphthous ulcers. Oropharynx- No Discharge or Erythema. +pnd Tonsils: Characteristics- Bilateral- No Erythema or Congestion. Size/Enlargement- Bilateral- No enlargement. Discharge- bilateral-None.  Neck Neck- Supple. No Masses.   Chest and Lung Exam Auscultation: Breath Sounds:-Clear even and unlabored.  Cardiovascular Auscultation:Rythm- Regular, rate and rhythm. Murmurs & Other Heart Sounds:Ausculatation of the heart reveal- No Murmurs.  Lymphatic Head & Neck General Head & Neck Lymphatics: Bilateral: Description- No Localized lymphadenopathy.  Abdomen- soft, nt, nd, +bs.        Assessment & Plan:  You may have had recent uri vs allergic rhinitis. Rx flonase nasal spray and benzonatate for cough.  I am making azithromycin available if despite above  treatment you have sinus infection or bronchitis symptoms.  Your cough that has occurred for a while before recent illness and after eating could be reflux related. Would rx trial of ranitidine. If this does not help then may consider GI referral.   Follow up in 3 wks or as needed

## 2015-06-05 DIAGNOSIS — H1010 Acute atopic conjunctivitis, unspecified eye: Secondary | ICD-10-CM | POA: Diagnosis not present

## 2015-06-20 DIAGNOSIS — H02839 Dermatochalasis of unspecified eye, unspecified eyelid: Secondary | ICD-10-CM | POA: Diagnosis not present

## 2015-06-20 DIAGNOSIS — H2513 Age-related nuclear cataract, bilateral: Secondary | ICD-10-CM | POA: Diagnosis not present

## 2015-06-20 DIAGNOSIS — H409 Unspecified glaucoma: Secondary | ICD-10-CM | POA: Diagnosis not present

## 2015-06-20 DIAGNOSIS — H18413 Arcus senilis, bilateral: Secondary | ICD-10-CM | POA: Diagnosis not present

## 2015-07-15 DIAGNOSIS — R972 Elevated prostate specific antigen [PSA]: Secondary | ICD-10-CM | POA: Diagnosis not present

## 2015-09-23 ENCOUNTER — Encounter: Payer: Self-pay | Admitting: Family Medicine

## 2015-09-23 ENCOUNTER — Telehealth: Payer: Self-pay | Admitting: Family Medicine

## 2015-09-23 ENCOUNTER — Ambulatory Visit (INDEPENDENT_AMBULATORY_CARE_PROVIDER_SITE_OTHER): Payer: Medicare PPO | Admitting: Family Medicine

## 2015-09-23 VITALS — BP 127/82 | HR 81 | Temp 98.0°F | Ht 73.0 in | Wt 198.8 lb

## 2015-09-23 DIAGNOSIS — Z01810 Encounter for preprocedural cardiovascular examination: Secondary | ICD-10-CM | POA: Diagnosis not present

## 2015-09-23 LAB — POCT URINALYSIS DIPSTICK
Bilirubin, UA: NEGATIVE
Blood, UA: NEGATIVE
Glucose, UA: NEGATIVE
KETONES UA: NEGATIVE
Leukocytes, UA: NEGATIVE
Nitrite, UA: NEGATIVE
Protein, UA: NEGATIVE
Urobilinogen, UA: 0.2
pH, UA: 5.5

## 2015-09-23 NOTE — Progress Notes (Signed)
Patient ID: Antonio Stephens, male    DOB: 1945/11/07  Age: 70 y.o. MRN: [id number]    Subjective:  Subjective  HPI Antonio Stephens presents for pre op clearance.    Antonio Stephens is having a R hip replacement with Dr Johney Maine in [place] , [place].  --- surgery is set for Nov 04, 2015.    Review of Systems  Constitutional: Negative.   HENT: Negative for congestion, ear pain, hearing loss, nosebleeds, postnasal drip, rhinorrhea, sinus pressure, sneezing and tinnitus.   Eyes: Negative for photophobia, discharge, itching and visual disturbance.  Respiratory: Negative.   Cardiovascular: Negative.   Gastrointestinal: Negative for abdominal distention, abdominal pain, anal bleeding, blood in stool and constipation.  Endocrine: Negative.   Genitourinary: Negative.   Musculoskeletal: Positive for arthralgias and gait problem.  Skin: Negative.   Allergic/Immunologic: Negative.   Neurological: Negative for dizziness, weakness, light-headedness, numbness and headaches.  Psychiatric/Behavioral: Negative for agitation, confusion, decreased concentration, dysphoric mood, sleep disturbance and suicidal ideas. The patient is not nervous/anxious.     History Past Medical History:  Diagnosis Date  . Back pain   . 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 Depression   . Glaucoma    bilateral  . Hyperlipidemia   . Hypertension     Antonio Stephens has a past surgical history that includes Knee arthroscopy (04/2005); inguinal Herniorrhaphy; 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; Total hip arthroplasty (12/16/09); Knee surgery (09-2009); Colonoscopy; and Partial knee arthroplasty (Left, 10.10.16).   Antonio Stephens family history includes Alzheimer's disease (age of onset: 34) in Antonio Stephens mother; Breast cancer in Antonio Stephens sister; Cancer (age of onset: 97) in Antonio Stephens sister; Cancer (age of onset: 46) in Antonio Stephens father; Hyperlipidemia in Antonio Stephens paternal grandfather.Antonio Stephens reports that Antonio Stephens  quit smoking about 48 years ago. Antonio Stephens smoking use included Cigarettes. Antonio Stephens quit after 0.20 years of use. Antonio Stephens has never used smokeless tobacco. Antonio Stephens reports that Antonio Stephens drinks about 4.2 - 6.0 oz of alcohol per week . Antonio Stephens reports that Antonio Stephens does not use drugs.  Current Outpatient Prescriptions on File Prior to Visit  Medication Sig Dispense Refill  . aspirin 81 MG tablet Take 81 mg by mouth daily.    . Garlic-Calcium (BL GARLIC PO) Take 99991111 mg by mouth daily.    . Glucosamine-Chondroit-Vit C-Mn (GLUCOSAMINE CHONDR 1500 COMPLX PO) Take 1 tablet by mouth 2 (two) times daily.     Marland Kitchen lisinopril (PRINIVIL,ZESTRIL) 20 MG tablet TAKE 1 TABLET BY MOUTH EVERY DAY 30 tablet 5  . multivitamin (THERAGRAN) per tablet Take 1 tablet by mouth daily.      . Omega 3 1000 MG CAPS Take 1 capsule by mouth 2 (two) times daily.    . rosuvastatin (CRESTOR) 20 MG tablet TAKE 1 TABLET BY MOUTH DAILY 30 tablet 5  . sertraline (ZOLOFT) 100 MG tablet Take 100 mg by mouth daily.      . timolol (TIMOPTIC) 0.5 % ophthalmic solution Place into the left eye.    . Turmeric 500 MG CAPS Take 1 capsule by mouth daily.     No current facility-administered medications on file prior to visit.      Objective:  Objective  Physical Exam  Constitutional: Antonio Stephens is oriented to person, place, and time. Antonio Stephens appears well-developed and well-nourished. No distress.  HENT:  Head: Normocephalic and atraumatic.  Right Ear: External ear normal.  Left Ear: External ear normal.  Nose: Nose normal.  Mouth/Throat: Oropharynx is clear and moist. No oropharyngeal exudate.  Eyes: Conjunctivae and EOM are normal. Pupils are equal, round, and reactive to light. Right eye exhibits no discharge. Left eye exhibits no discharge.  Neck: Normal range of motion. Neck supple. No JVD present. No thyromegaly present.  Cardiovascular: Normal rate, regular rhythm and intact distal pulses.  Exam reveals no gallop and no friction rub.   No murmur heard. Pulmonary/Chest: Effort  normal and breath sounds normal. No respiratory distress. Antonio Stephens has no wheezes. Antonio Stephens has no rales. Antonio Stephens exhibits no tenderness.  Abdominal: Soft. Bowel sounds are normal. Antonio Stephens exhibits no distension and no mass. There is no tenderness. There is no rebound and no guarding.  Genitourinary: Rectum normal, prostate normal and penis normal. Rectal exam shows guaiac negative stool.  Musculoskeletal: Antonio Stephens exhibits tenderness.       Right hip: Antonio Stephens exhibits decreased range of motion and tenderness.  Lymphadenopathy:    Antonio Stephens has no cervical adenopathy.  Neurological: Antonio Stephens is alert and oriented to person, place, and time. Antonio Stephens displays normal reflexes. Antonio Stephens exhibits normal muscle tone.  Skin: Skin is warm and dry. No rash noted. Antonio Stephens is not diaphoretic. No erythema. No pallor.  Psychiatric: Antonio Stephens has a normal mood and affect. Antonio Stephens behavior is normal. Judgment and thought content normal.  Nursing note and vitals reviewed.  BP 127/82 (BP Location: Left Arm, Patient Position: Sitting, Cuff Size: Normal)   Pulse 81   Temp 98 F (36.7 C) (Oral)   Ht 6\' 1"  (1.854 m)   Wt 198 lb 12.8 oz (90.2 kg)   SpO2 95%   BMI 26.23 kg/m  Wt Readings from Last 3 Encounters:  09/23/15 198 lb 12.8 oz (90.2 kg)  05/27/15 195 lb (88.5 kg)  03/11/15 193 lb 12.8 oz (87.9 kg)     Lab Results  Component Value Date   WBC 10.9 (H) 03/11/2015   HGB 15.8 03/11/2015   HCT 46.9 03/11/2015   PLT 238.0 03/11/2015   GLUCOSE 108 (H) 03/11/2015   CHOL 198 03/11/2015   TRIG 103.0 03/11/2015   HDL 64.20 03/11/2015   LDLDIRECT 117.0 07/27/2006   LDLCALC 113 (H) 03/11/2015   ALT 19 03/11/2015   AST 21 03/11/2015   NA 138 03/11/2015   K 5.3 (H) 03/11/2015   CL 101 03/11/2015   CREATININE 1.05 03/11/2015   BUN 26 (H) 03/11/2015   CO2 30 03/11/2015   TSH 1.83 11/05/2011   PSA 5.62 (H) 03/11/2015   INR 0.91 07/29/2009   HGBA1C 5.8 02/07/2013   MICROALBUR 1.2 12/10/2009  ekg--  No acute changes from previous  Mr Darnelle Catalan Wo/w Cm  Result Date:  12/21/2014 CLINICAL DATA:  Optic neuropathy.  History of glaucoma. Creatinine was obtained on site at [facility] at [address]Results: Creatinine 1 point to mg/dL. EXAM: MRI OF THE ORBITS WITHOUT AND WITH CONTRAST TECHNIQUE: Multiplanar, multisequence MR imaging of the orbits was performed both before and after the administration of intravenous contrast. CONTRAST:  73mL MULTIHANCE GADOBENATE DIMEGLUMINE 529 MG/ML IV SOLN COMPARISON:  Brain MRI 07/29/2009 and 12/25/2008 FINDINGS: The globes appear intact. The extraocular muscles and lacrimal glands are unremarkable. The no orbital mass or inflammation is identified. There is asymmetric atrophy of the left optic nerve with the entire optic nerve sheath complex being smaller than on the contralateral side. This likely reflects a change from the prior studies, although those did not include dedicated orbital imaging. No gross optic nerve T2 hyperintensity  is seen, although evaluation is partially limited by the nurse small size on the left. No abnormal enhancement is identified involving the optic nerve sheath complexes bilaterally. Optic chiasm is normal in appearance. There is mild mucosal thickening in the left ethmoid greater than left frontal sinuses. Mastoid air cells are clear. A small, chronic infarct is again noted at the inferior aspect of the right middle cerebellar peduncle. There is mild cerebral and cerebellar atrophy. IMPRESSION: 1. Asymmetric left optic nerve atrophy. No evidence of orbital inflammation or mass. 2. Chronic right middle cerebellar peduncle infarct. Electronically Signed   By: Logan Bores M.D.   On: 12/21/2014 07:01     Assessment & Plan:  Plan  I have discontinued Mr. Alcauter Calcium Carbonate-Vit D-Min (CALCIUM 1200 PO), cholecalciferol, fluticasone, benzonatate, azithromycin, and ranitidine. I am also having Antonio Stephens maintain Antonio Stephens Glucosamine-Chondroit-Vit C-Mn (GLUCOSAMINE CHONDR 1500 COMPLX PO), multivitamin,  sertraline, Turmeric, Garlic-Calcium (BL GARLIC PO), Omega 3, timolol, aspirin, rosuvastatin, lisinopril, and Vitamin D3.  Meds ordered this encounter  Medications  . Cholecalciferol (VITAMIN D3) 5000 units CAPS    Sig: Take 1 capsule by mouth daily.    Problem List Items Addressed This Visit    None    Visit Diagnoses    Pre-operative cardiovascular examination    -  Primary   Relevant Orders   EKG 12-Lead (Completed)   CBC with Differential/Platelet   Comprehensive metabolic panel   POCT urinalysis dipstick (Completed)   DG Chest 2 View    labs ordered per instructions from surgeon Clearance to be done once labs come back  Follow-up: Return in about 6 months (around 03/22/2016).  Ann Held, DO

## 2015-09-23 NOTE — Telephone Encounter (Signed)
Pt in office for appt and asking to speak to someone about billing from 05/27/15 when he saw Mackie Pai, [place]. Pt states that it was billed under an entirely different facility to his insurance. Pt requesting call at [phone].

## 2015-09-23 NOTE — Progress Notes (Signed)
Pre visit review using our clinic review tool, if applicable. No additional management support is needed unless otherwise documented below in the visit note. 

## 2015-09-23 NOTE — Patient Instructions (Signed)
Cleared for surgery We will see you after

## 2015-09-24 ENCOUNTER — Ambulatory Visit (HOSPITAL_BASED_OUTPATIENT_CLINIC_OR_DEPARTMENT_OTHER)
Admission: RE | Admit: 2015-09-24 | Discharge: 2015-09-24 | Disposition: A | Discharge status: Home / Self Care | Payer: Medicare PPO | Source: Ambulatory Visit | Attending: Family Medicine | Admitting: Family Medicine

## 2015-09-24 DIAGNOSIS — Z01810 Encounter for preprocedural cardiovascular examination: Secondary | ICD-10-CM

## 2015-09-24 DIAGNOSIS — I700 Atherosclerosis of aorta: Secondary | ICD-10-CM | POA: Insufficient documentation

## 2015-09-24 LAB — CBC WITH DIFFERENTIAL/PLATELET
BASOS PCT: 0.4 % (ref 0.0–3.0)
Basophils Absolute: 0 10*3/uL (ref 0.0–0.1)
EOS ABS: 0.3 10*3/uL (ref 0.0–0.7)
Eosinophils Relative: 3.7 % (ref 0.0–5.0)
HEMATOCRIT: 41.4 % (ref 39.0–52.0)
Hemoglobin: 14.1 g/dL (ref 13.0–17.0)
LYMPHS PCT: 35.3 % (ref 12.0–46.0)
Lymphs Abs: 2.9 10*3/uL (ref 0.7–4.0)
MCHC: 34 g/dL (ref 30.0–36.0)
MCV: 92.6 fl (ref 78.0–100.0)
Monocytes Absolute: 0.8 10*3/uL (ref 0.1–1.0)
Monocytes Relative: 9 % (ref 3.0–12.0)
NEUTROS ABS: 4.3 10*3/uL (ref 1.4–7.7)
Neutrophils Relative %: 51.6 % (ref 43.0–77.0)
PLATELETS: 181 10*3/uL (ref 150.0–400.0)
RBC: 4.47 Mil/uL (ref 4.22–5.81)
RDW: 13.8 % (ref 11.5–15.5)
WBC: 8.3 10*3/uL (ref 4.0–10.5)

## 2015-09-24 LAB — COMPREHENSIVE METABOLIC PANEL
ALK PHOS: 63 U/L (ref 39–117)
ALT: 16 U/L (ref 0–53)
AST: 20 U/L (ref 0–37)
Albumin: 3.8 g/dL (ref 3.5–5.2)
BUN: 29 mg/dL — ABNORMAL HIGH (ref 6–23)
CALCIUM: 8.8 mg/dL (ref 8.4–10.5)
CHLORIDE: 104 meq/L (ref 96–112)
CO2: 32 mEq/L (ref 19–32)
CREATININE: 1.08 mg/dL (ref 0.40–1.50)
GFR: 71.46 mL/min (ref >60.00)
Glucose, Bld: 107 mg/dL — ABNORMAL HIGH (ref 70–99)
POTASSIUM: 4 meq/L (ref 3.5–5.1)
Sodium: 140 mEq/L (ref 135–145)
Total Bilirubin: 0.6 mg/dL (ref 0.2–1.2)
Total Protein: 6.1 g/dL (ref 6.0–8.3)

## 2015-10-07 ENCOUNTER — Encounter: Payer: Self-pay | Admitting: Family Medicine

## 2015-10-07 NOTE — Telephone Encounter (Signed)
This has been corrected according to billing note

## 2015-10-08 DIAGNOSIS — M25562 Pain in left knee: Secondary | ICD-10-CM | POA: Diagnosis not present

## 2015-10-08 DIAGNOSIS — M8588 Other specified disorders of bone density and structure, other site: Secondary | ICD-10-CM | POA: Diagnosis not present

## 2015-10-08 DIAGNOSIS — M1612 Unilateral primary osteoarthritis, left hip: Secondary | ICD-10-CM | POA: Diagnosis not present

## 2015-10-08 DIAGNOSIS — M1611 Unilateral primary osteoarthritis, right hip: Secondary | ICD-10-CM | POA: Diagnosis not present

## 2015-10-17 ENCOUNTER — Encounter: Payer: Self-pay | Admitting: Family Medicine

## 2015-11-04 DIAGNOSIS — Q6589 Other specified congenital deformities of hip: Secondary | ICD-10-CM | POA: Diagnosis not present

## 2015-11-04 DIAGNOSIS — Z79899 Other long term (current) drug therapy: Secondary | ICD-10-CM | POA: Diagnosis not present

## 2015-11-04 DIAGNOSIS — Z7982 Long term (current) use of aspirin: Secondary | ICD-10-CM | POA: Diagnosis not present

## 2015-11-04 DIAGNOSIS — I10 Essential (primary) hypertension: Secondary | ICD-10-CM | POA: Diagnosis not present

## 2015-11-04 DIAGNOSIS — Z8673 Personal history of transient ischemic attack (TIA), and cerebral infarction without residual deficits: Secondary | ICD-10-CM | POA: Diagnosis not present

## 2015-11-04 DIAGNOSIS — Z96643 Presence of artificial hip joint, bilateral: Secondary | ICD-10-CM | POA: Diagnosis not present

## 2015-11-04 DIAGNOSIS — Z96641 Presence of right artificial hip joint: Secondary | ICD-10-CM | POA: Diagnosis not present

## 2015-11-04 DIAGNOSIS — Z96642 Presence of left artificial hip joint: Secondary | ICD-10-CM | POA: Diagnosis not present

## 2015-11-04 DIAGNOSIS — M1611 Unilateral primary osteoarthritis, right hip: Secondary | ICD-10-CM | POA: Diagnosis not present

## 2015-11-04 DIAGNOSIS — S76311A Strain of muscle, fascia and tendon of the posterior muscle group at thigh level, right thigh, initial encounter: Secondary | ICD-10-CM | POA: Diagnosis not present

## 2015-11-04 DIAGNOSIS — Z8619 Personal history of other infectious and parasitic diseases: Secondary | ICD-10-CM | POA: Diagnosis not present

## 2015-11-04 DIAGNOSIS — Z471 Aftercare following joint replacement surgery: Secondary | ICD-10-CM | POA: Diagnosis not present

## 2015-11-04 HISTORY — PX: HIP RESURFACING: SHX1760

## 2015-11-06 ENCOUNTER — Other Ambulatory Visit: Payer: Self-pay | Admitting: Family Medicine

## 2015-11-13 ENCOUNTER — Telehealth: Payer: Self-pay | Admitting: Family Medicine

## 2015-11-13 NOTE — Telephone Encounter (Signed)
[facility] - Client TELEPHONE ADVICE RECORD [facility]  Patient Name: Antonio Stephens  DOB: 1945/10/02    Initial Comment husband having severe headaches at night, not sleeping much, had hip surgery last week.    Nurse Assessment  Nurse: Wayne Sever, RN, Tillie Rung Date/Time (Eastern Time): 11/13/2015 9:37:20 AM  Confirm and document reason for call. If symptomatic, describe symptoms. You must click the next button to save text entered. ---Wife states he is having severe headaches, but only at night. He had hip replacement last week. He is only taking Tylenol PM at night.  Has the patient traveled out of the country within the last 30 days? ---Not Applicable  Does the patient have any new or worsening symptoms? ---Yes  Will a triage be completed? ---Yes  Related visit to physician within the last 2 weeks? ---No  Does the PT have any chronic conditions? (i.e. diabetes, asthma, etc.) ---Yes  List chronic conditions. ---High Cholesterol, HTN  Is this a behavioral health or substance abuse call? ---No     Guidelines    Guideline Title Affirmed Question Affirmed Notes  Headache [1] MODERATE headache (e.g., interferes with normal activities) AND [2] present > 24 hours AND [3] unexplained (Exceptions: analgesics not tried, typical migraine, or headache part of viral illness)    Final Disposition User   See Physician within 24 Hours Mullen, RN, Tillie Rung    Comments  Scheduled with Dr. Etter Sjogren at 0900 tomorrow, 11/09   Referrals  REFERRED TO PCP OFFICE   Disagree/Comply: Leta Baptist

## 2015-11-13 NOTE — Telephone Encounter (Signed)
Pt's spouse Gwyndolyn Saxon called in this morning to speak with a nurse. She says that pt had surgery a week ago Monday and is not sleeping very well. She says that the pt has been getting severe headaches . She would like to know if PCP would call pt in something to the pharmacy. I advised spouse that pt needs an appt to be assessed. Offer appts. She/pt  says that they ONLY want to see his PCP. PCP is currently out of the office today. I suggested an Urgent care / ED. She says that she's not sure. I transferred call to [facility] for further assistance.

## 2015-11-14 ENCOUNTER — Ambulatory Visit (INDEPENDENT_AMBULATORY_CARE_PROVIDER_SITE_OTHER): Payer: Medicare PPO | Admitting: Family Medicine

## 2015-11-14 ENCOUNTER — Encounter: Payer: Self-pay | Admitting: Family Medicine

## 2015-11-14 VITALS — BP 102/68 | Temp 97.5°F | Resp 16 | Ht 72.0 in | Wt 196.2 lb

## 2015-11-14 DIAGNOSIS — R351 Nocturia: Secondary | ICD-10-CM

## 2015-11-14 DIAGNOSIS — R51 Headache: Secondary | ICD-10-CM

## 2015-11-14 DIAGNOSIS — R350 Frequency of micturition: Secondary | ICD-10-CM | POA: Diagnosis not present

## 2015-11-14 DIAGNOSIS — R519 Headache, unspecified: Secondary | ICD-10-CM | POA: Insufficient documentation

## 2015-11-14 LAB — CBC WITH DIFFERENTIAL/PLATELET
BASOS ABS: 0 10*3/uL (ref 0.0–0.1)
BASOS PCT: 0.2 % (ref 0.0–3.0)
EOS ABS: 0.3 10*3/uL (ref 0.0–0.7)
Eosinophils Relative: 2.8 % (ref 0.0–5.0)
HEMATOCRIT: 35.4 % — AB (ref 39.0–52.0)
HEMOGLOBIN: 11.8 g/dL — AB (ref 13.0–17.0)
LYMPHS PCT: 15.6 % (ref 12.0–46.0)
Lymphs Abs: 1.7 10*3/uL (ref 0.7–4.0)
MCHC: 33.4 g/dL (ref 30.0–36.0)
MCV: 93.6 fl (ref 78.0–100.0)
MONOS PCT: 8.9 % (ref 3.0–12.0)
Monocytes Absolute: 1 10*3/uL (ref 0.1–1.0)
NEUTROS ABS: 7.8 10*3/uL — AB (ref 1.4–7.7)
Neutrophils Relative %: 72.5 % (ref 43.0–77.0)
PLATELETS: 386 10*3/uL (ref 150.0–400.0)
RBC: 3.79 Mil/uL — ABNORMAL LOW (ref 4.22–5.81)
RDW: 14.1 % (ref 11.5–15.5)
WBC: 10.8 10*3/uL — AB (ref 4.0–10.5)

## 2015-11-14 LAB — POCT URINALYSIS DIPSTICK
Bilirubin, UA: NEGATIVE
Blood, UA: NEGATIVE
GLUCOSE UA: NEGATIVE
Ketones, UA: NEGATIVE
LEUKOCYTES UA: NEGATIVE
Nitrite, UA: NEGATIVE
SPEC GRAV UA: 1.025
UROBILINOGEN UA: NEGATIVE
pH, UA: 6

## 2015-11-14 LAB — COMPREHENSIVE METABOLIC PANEL
ALT: 36 U/L (ref 0–53)
AST: 31 U/L (ref 0–37)
Albumin: 3.9 g/dL (ref 3.5–5.2)
Alkaline Phosphatase: 52 U/L (ref 39–117)
BILIRUBIN TOTAL: 0.8 mg/dL (ref 0.2–1.2)
BUN: 22 mg/dL (ref 6–23)
CO2: 29 mEq/L (ref 19–32)
CREATININE: 0.94 mg/dL (ref 0.40–1.50)
Calcium: 9.2 mg/dL (ref 8.4–10.5)
Chloride: 102 mEq/L (ref 96–112)
GFR: 83.84 mL/min (ref >60.00)
GLUCOSE: 104 mg/dL — AB (ref 70–99)
POTASSIUM: 4.8 meq/L (ref 3.5–5.1)
SODIUM: 138 meq/L (ref 135–145)
TOTAL PROTEIN: 6.2 g/dL (ref 6.0–8.3)

## 2015-11-14 LAB — HEMOGLOBIN A1C: Hgb A1c MFr Bld: 5.7 % (ref 4.6–6.5)

## 2015-11-14 LAB — PSA: PSA: 5.93 ng/mL — AB (ref 0.10–4.00)

## 2015-11-14 MED ORDER — CIPROFLOXACIN HCL 500 MG PO TABS: 500.0000 mg | ORAL_TABLET | Freq: Two times a day (BID) | ORAL | 0 refills | Status: DC

## 2015-11-14 NOTE — Assessment & Plan Note (Signed)
Only at night ? Prostatitis cipro x 14 days  Consider f/u urology if no better

## 2015-11-14 NOTE — Patient Instructions (Signed)

## 2015-11-14 NOTE — Progress Notes (Signed)
Patient ID: Antonio Stephens, male    DOB: October 28, 1945  Age: 70 y.o. MRN: [id number]      Subjective:  Subjective  HPI [name] presents for f/u after hip replacement.  Surgery was 10/30. Surgery went well but took about 1 hour longer than expected.  He has had headaches and he has frequent urination at night and dry heaves in the am.  This has been since the surgery.  The headache goes away after he eats.    Review of Systems  Constitutional: Negative for appetite change, diaphoresis, fatigue and unexpected weight change.  Eyes: Negative for pain, redness and visual disturbance.  Respiratory: Negative for cough, chest tightness, shortness of breath and wheezing.   Cardiovascular: Negative for chest pain, palpitations and leg swelling.  Endocrine: Negative for cold intolerance, heat intolerance, polydipsia, polyphagia and polyuria.  Genitourinary: Negative for difficulty urinating, dysuria and frequency.  Neurological: Positive for headaches. Negative for dizziness, light-headedness and numbness.    History Past Medical History:  Diagnosis Date  . Back pain   . 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 Depression   . Glaucoma    bilateral  . Hyperlipidemia   . Hypertension     He has a past surgical history that includes Knee arthroscopy (04/2005); inguinal Herniorrhaphy; 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; Total hip arthroplasty (12/16/09); Knee surgery (09-2009); Colonoscopy; and Partial knee arthroplasty (Left, 10.10.16).   His family history includes Alzheimer's disease (age of onset: 30) in his mother; Breast cancer in his sister; Cancer (age of onset: 6) in his sister; Cancer (age of onset: 74) in his father; Hyperlipidemia in his paternal grandfather.He reports that he quit smoking about 48 years ago. His smoking use included Cigarettes. He quit after 0.20 years of use. He has never used smokeless  tobacco. He reports that he drinks about 4.2 - 6.0 oz of alcohol per week . He reports that he does not use drugs.  Current Outpatient Prescriptions on File Prior to Visit  Medication Sig Dispense Refill  . aspirin 81 MG tablet Take 81 mg by mouth daily.    . Cholecalciferol (VITAMIN D3) 5000 units CAPS Take 1 capsule by mouth daily.    . Garlic-Calcium (BL GARLIC PO) Take 99991111 mg by mouth daily.    . Glucosamine-Chondroit-Vit C-Mn (GLUCOSAMINE CHONDR 1500 COMPLX PO) Take 1 tablet by mouth 2 (two) times daily.     Marland Kitchen lisinopril (PRINIVIL,ZESTRIL) 20 MG tablet TAKE 1 TABLET BY MOUTH EVERY DAY 30 tablet 5  . multivitamin (THERAGRAN) per tablet Take 1 tablet by mouth daily.      . Omega 3 1000 MG CAPS Take 1 capsule by mouth 2 (two) times daily.    . rosuvastatin (CRESTOR) 20 MG tablet TAKE 1 TABLET BY MOUTH DAILY 30 tablet 5  . sertraline (ZOLOFT) 100 MG tablet Take 100 mg by mouth daily.      . timolol (TIMOPTIC) 0.5 % ophthalmic solution Place into the left eye.    . Turmeric 500 MG CAPS Take 1 capsule by mouth daily.     No current facility-administered medications on file prior to visit.      Objective:  Objective  Physical Exam  Constitutional: He is oriented to person, place, and time. Vital signs are normal. He appears well-developed and well-nourished. He is sleeping.  HENT:  Head: Normocephalic and atraumatic.  Mouth/Throat: Oropharynx is clear and moist.  Eyes: EOM are normal. Pupils are equal, round, and reactive to light.  Neck: Normal range of motion. Neck supple. No thyromegaly present.  Cardiovascular: Normal rate and regular rhythm.   No murmur heard. Pulmonary/Chest: Effort normal and breath sounds normal. No respiratory distress. He has no wheezes. He has no rales. He exhibits no tenderness.  Musculoskeletal: He exhibits tenderness. He exhibits no edema.  Neurological: He is alert and oriented to person, place, and time.  Skin: Skin is warm and dry.  Psychiatric: He  has a normal mood and affect. His behavior is normal. Judgment and thought content normal.  Nursing note and vitals reviewed.  BP 102/68 (BP Location: Right Arm, Patient Position: Sitting, Cuff Size: Large)   Temp 97.5 F (36.4 C) (Oral)   Resp 16   Ht 6' (1.829 m)   Wt 196 lb 3.2 oz (89 kg)   BMI 26.61 kg/m  Wt Readings from Last 3 Encounters:  11/14/15 196 lb 3.2 oz (89 kg)  09/23/15 198 lb 12.8 oz (90.2 kg)  05/27/15 195 lb (88.5 kg)     Lab Results  Component Value Date   WBC 10.8 (H) 11/14/2015   HGB 11.8 (L) 11/14/2015   HCT 35.4 (L) 11/14/2015   PLT 386.0 11/14/2015   GLUCOSE 104 (H) 11/14/2015   CHOL 198 03/11/2015   TRIG 103.0 03/11/2015   HDL 64.20 03/11/2015   LDLDIRECT 117.0 07/27/2006   LDLCALC 113 (H) 03/11/2015   ALT 36 11/14/2015   AST 31 11/14/2015   NA 138 11/14/2015   K 4.8 11/14/2015   CL 102 11/14/2015   CREATININE 0.94 11/14/2015   BUN 22 11/14/2015   CO2 29 11/14/2015   TSH 1.83 11/05/2011   PSA 5.93 (H) 11/14/2015   INR 0.91 07/29/2009   HGBA1C 5.7 11/14/2015   MICROALBUR 1.2 12/10/2009    Dg Chest 2 View  Result Date: 09/24/2015 CLINICAL DATA:  Preop respiratory exam for total hip arthroplasty. EXAM: CHEST  2 VIEW COMPARISON:  09/11/2014 FINDINGS: Heart size is normal. Tortuosity and atherosclerotic calcification of thoracic aorta is stable. Both lungs are clear. No evidence of pneumothorax or pleural effusion. IMPRESSION: No active cardiopulmonary disease. Aortic atherosclerosis. Electronically Signed   By: Earle Gell M.D.   On: 09/24/2015 13:18     Assessment & Plan:  Plan  I am having Mr. Siemon start on ciprofloxacin. I am also having him maintain his Glucosamine-Chondroit-Vit C-Mn (GLUCOSAMINE CHONDR 1500 COMPLX PO), multivitamin, sertraline, Turmeric, Garlic-Calcium (BL GARLIC PO), Omega 3, timolol, aspirin, Vitamin D3, rosuvastatin, lisinopril, ELIQUIS, meloxicam, pantoprazole, and alendronate.  Meds ordered this encounter    Medications  . ELIQUIS 2.5 MG TABS tablet    Sig: Take 2.5 mg by mouth 2 (two) times daily.    Refill:  0  . meloxicam (MOBIC) 15 MG tablet    Sig: Take 15 mg by mouth daily.    Refill:  0  . pantoprazole (PROTONIX) 40 MG tablet    Sig: Take 40 mg by mouth daily.    Refill:  0  . alendronate (FOSAMAX) 70 MG tablet    Sig: Take 70 mg by mouth once a week.    Refill:  5  . ciprofloxacin (CIPRO) 500 MG tablet    Sig: Take 1 tablet (500 mg total) by mouth 2 (two) times daily.    Dispense:  28 tablet    Refill:  0    Problem List Items Addressed This Visit    None  Visit Diagnoses    Frequent headaches    -  Primary   Relevant Medications   meloxicam (MOBIC) 15 MG tablet   Other Relevant Orders   Comprehensive metabolic panel (Completed)   CBC with Differential/Platelet (Completed)   Hemoglobin A1c (Completed)   POCT urinalysis dipstick (Completed)   PSA (Completed)   Frequent urination at night       Relevant Medications   ciprofloxacin (CIPRO) 500 MG tablet   Other Relevant Orders   Comprehensive metabolic panel (Completed)   CBC with Differential/Platelet (Completed)   Hemoglobin A1c (Completed)   POCT urinalysis dipstick (Completed)   PSA (Completed)      Follow-up: Return if symptoms worsen or fail to improve.  Ann Held, DO

## 2015-11-14 NOTE — Progress Notes (Signed)
Pre visit review using our clinic review tool, if applicable. No additional management support is needed unless otherwise documented below in the visit note. 

## 2015-11-14 NOTE — Assessment & Plan Note (Signed)
Improves with eating Eat either 5 small meals or 3 meals and 2 snacks---- one prior to bedtime  Call or rto prn

## 2015-11-27 ENCOUNTER — Ambulatory Visit: Payer: Medicare PPO | Admitting: Family Medicine

## 2015-11-27 DIAGNOSIS — J069 Acute upper respiratory infection, unspecified: Secondary | ICD-10-CM | POA: Diagnosis not present

## 2015-11-27 DIAGNOSIS — N390 Urinary tract infection, site not specified: Secondary | ICD-10-CM | POA: Diagnosis not present

## 2015-11-27 DIAGNOSIS — R509 Fever, unspecified: Secondary | ICD-10-CM | POA: Diagnosis not present

## 2015-11-29 ENCOUNTER — Emergency Department (HOSPITAL_COMMUNITY)
Admission: EM | Admit: 2015-11-29 | Discharge: 2015-11-29 | Disposition: A | Discharge status: Home / Self Care | Payer: Medicare PPO | Attending: Emergency Medicine | Admitting: Emergency Medicine

## 2015-11-29 ENCOUNTER — Encounter (HOSPITAL_COMMUNITY): Payer: Self-pay

## 2015-11-29 DIAGNOSIS — I10 Essential (primary) hypertension: Secondary | ICD-10-CM | POA: Insufficient documentation

## 2015-11-29 DIAGNOSIS — Z7901 Long term (current) use of anticoagulants: Secondary | ICD-10-CM | POA: Insufficient documentation

## 2015-11-29 DIAGNOSIS — Z79899 Other long term (current) drug therapy: Secondary | ICD-10-CM | POA: Diagnosis not present

## 2015-11-29 DIAGNOSIS — N3000 Acute cystitis without hematuria: Secondary | ICD-10-CM | POA: Diagnosis not present

## 2015-11-29 DIAGNOSIS — R339 Retention of urine, unspecified: Secondary | ICD-10-CM | POA: Diagnosis not present

## 2015-11-29 DIAGNOSIS — Z8673 Personal history of transient ischemic attack (TIA), and cerebral infarction without residual deficits: Secondary | ICD-10-CM | POA: Insufficient documentation

## 2015-11-29 DIAGNOSIS — Z96642 Presence of left artificial hip joint: Secondary | ICD-10-CM | POA: Diagnosis not present

## 2015-11-29 DIAGNOSIS — Z7982 Long term (current) use of aspirin: Secondary | ICD-10-CM | POA: Diagnosis not present

## 2015-11-29 DIAGNOSIS — Z87891 Personal history of nicotine dependence: Secondary | ICD-10-CM | POA: Diagnosis not present

## 2015-11-29 LAB — BASIC METABOLIC PANEL
Anion gap: 10 (ref 5–15)
BUN: 30 mg/dL — AB (ref 6–20)
CHLORIDE: 101 mmol/L (ref 101–111)
CO2: 24 mmol/L (ref 22–32)
CREATININE: 1.36 mg/dL — AB (ref 0.61–1.24)
Calcium: 8.8 mg/dL — ABNORMAL LOW (ref 8.9–10.3)
GFR calc Af Amer: 58 mL/min — ABNORMAL LOW (ref >60)
GFR calc non Af Amer: 50 mL/min — ABNORMAL LOW (ref >60)
GLUCOSE: 126 mg/dL — AB (ref 65–99)
Potassium: 3.9 mmol/L (ref 3.5–5.1)
SODIUM: 135 mmol/L (ref 135–145)

## 2015-11-29 LAB — URINE MICROSCOPIC-ADD ON

## 2015-11-29 LAB — URINALYSIS, ROUTINE W REFLEX MICROSCOPIC
BILIRUBIN URINE: NEGATIVE
Glucose, UA: NEGATIVE mg/dL
Ketones, ur: 15 mg/dL — AB
Nitrite: POSITIVE — AB
PROTEIN: 100 mg/dL — AB
SPECIFIC GRAVITY, URINE: 1.03 (ref 1.005–1.030)
pH: 5.5 (ref 5.0–8.0)

## 2015-11-29 LAB — CBC WITH DIFFERENTIAL/PLATELET
Basophils Absolute: 0 10*3/uL (ref 0.0–0.1)
Basophils Relative: 0 %
EOS ABS: 0 10*3/uL (ref 0.0–0.7)
EOS PCT: 0 %
HCT: 34.4 % — ABNORMAL LOW (ref 39.0–52.0)
Hemoglobin: 11.8 g/dL — ABNORMAL LOW (ref 13.0–17.0)
LYMPHS ABS: 0.9 10*3/uL (ref 0.7–4.0)
Lymphocytes Relative: 5 %
MCH: 31 pg (ref 26.0–34.0)
MCHC: 34.3 g/dL (ref 30.0–36.0)
MCV: 90.3 fL (ref 78.0–100.0)
MONO ABS: 1.3 10*3/uL — AB (ref 0.1–1.0)
Monocytes Relative: 6 %
Neutro Abs: 18.4 10*3/uL — ABNORMAL HIGH (ref 1.7–7.7)
Neutrophils Relative %: 89 %
PLATELETS: 242 10*3/uL (ref 150–400)
RBC: 3.81 MIL/uL — AB (ref 4.22–5.81)
RDW: 13.8 % (ref 11.5–15.5)
WBC: 20.6 10*3/uL — AB (ref 4.0–10.5)

## 2015-11-29 MED ORDER — CEPHALEXIN 250 MG PO CAPS
500.0000 mg | ORAL_CAPSULE | Freq: Once | ORAL | Status: DC
Start: 2015-11-29 — End: 2015-11-29

## 2015-11-29 MED ORDER — CEPHALEXIN 500 MG PO CAPS: 500.0000 mg | ORAL_CAPSULE | Freq: Four times a day (QID) | ORAL | 0 refills | Status: DC

## 2015-11-29 NOTE — Discharge Instructions (Signed)
Continue your current antibiotics.

## 2015-11-29 NOTE — ED Triage Notes (Signed)
Pt reports he has been unable to urinate X2 days. Pt reports he has been able to move his bowels. He reports he had hip replacement on 10/30. Pt reports he has had some dribbling of urine but no full stream.

## 2015-11-29 NOTE — ED Notes (Signed)
Pt bladder scanned for 818mL before catheter insertion

## 2015-11-29 NOTE — ED Provider Notes (Signed)
[facility] DEPT Provider Note   CSN: [id number] Arrival date & time: 11/29/15  1038     History   Chief Complaint Chief Complaint  Patient presents with  . Urinary Retention    HPI Antonio Stephens is a 70 y.o. male.  Pt has been unable to urinate for the past 2 days.  He said that he has had problems with urinating since he had hip surgery on 10/30.  The pt said that he has been dribbling urine, but he feels like there is a lot left.  The pt does have a urologist at [facility] that he has been seeing for slight elevation in PSA.        Past Medical History:  Diagnosis Date  . Back pain   . 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 Depression   . Glaucoma    bilateral  . Hyperlipidemia   . Hypertension     Patient Active Problem List   Diagnosis Date Noted  . Frequent headaches 11/14/2015  . Olecranon bursitis of right elbow 04/11/2013  . Effusion of olecranon bursa 04/10/2013  . Soft tissue mass, right subscapular 02/13/2013  . Onychomycosis 10/16/2011  . LOC OSTEOARTHROS NOT SPEC PRIM/SEC PELV RGN&THI 12/10/2009  . HYPERGLYCEMIA, FASTING 12/10/2009  . FREQUENCY, URINARY 09/05/2009  . Unspecified essential hypertension 07/19/2009  . DEGENERATIVE JOINT DISEASE, ADVANCED 07/19/2009  . DEPRESSION, HX OF 02/05/2009  . DIASTOLIC DYSFUNCTION 0000000  . CEREBROVASCULAR ACCIDENT, HX OF 12/25/2008  . HYPERLIPIDEMIA 11/18/2006  . BACK PAIN 10/14/2006    Past Surgical History:  Procedure Laterality Date  . COLONOSCOPY    . inguinal Herniorrhaphy     left  . KNEE ARTHROSCOPY  04/2005   right  . KNEE SURGERY  09-2009   partial replacemente , R  . PARTIAL KNEE ARTHROPLASTY Left 10.10.16  . TOTAL HIP ARTHROPLASTY  12/16/09   left hip  . 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     benign/fatty tumor       Home Medications    Prior to Admission medications   Medication Sig Start Date End  Date Taking? Authorizing Provider  alendronate (FOSAMAX) 70 MG tablet Take 70 mg by mouth once a week. 11/11/15   Historical Provider, MD  aspirin 81 MG tablet Take 81 mg by mouth daily.    Historical Provider, MD  cephALEXin (KEFLEX) 500 MG capsule Take 1 capsule (500 mg total) by mouth 4 (four) times daily. 11/29/15   Isla Pence, MD  Cholecalciferol (VITAMIN D3) 5000 units CAPS Take 1 capsule by mouth daily.    Historical Provider, MD  ciprofloxacin (CIPRO) 500 MG tablet Take 1 tablet (500 mg total) by mouth 2 (two) times daily. 11/14/15   Yvonne R Lowne Chase, DO  ELIQUIS 2.5 MG TABS tablet Take 2.5 mg by mouth 2 (two) times daily. 10/07/15   Historical Provider, MD  Garlic-Calcium (BL GARLIC PO) Take 99991111 mg by mouth daily.    Historical Provider, MD  Glucosamine-Chondroit-Vit C-Mn (GLUCOSAMINE CHONDR 1500 COMPLX PO) Take 1 tablet by mouth 2 (two) times daily.     Historical Provider, MD  lisinopril (PRINIVIL,ZESTRIL) 20 MG tablet TAKE 1 TABLET BY MOUTH EVERY DAY 11/06/15   Alferd Apa Lowne Chase, DO  meloxicam (MOBIC) 15 MG tablet Take 15 mg by mouth daily. 10/07/15   Historical Provider, MD  multivitamin [facility]) per tablet Take 1 tablet by mouth daily.  Historical Provider, MD  Omega 3 1000 MG CAPS Take 1 capsule by mouth 2 (two) times daily.    Historical Provider, MD  pantoprazole (PROTONIX) 40 MG tablet Take 40 mg by mouth daily. 10/07/15   Historical Provider, MD  rosuvastatin (CRESTOR) 20 MG tablet TAKE 1 TABLET BY MOUTH DAILY 11/06/15   Rosalita Chessman Chase, DO  sertraline (ZOLOFT) 100 MG tablet Take 100 mg by mouth daily.      Historical Provider, MD  timolol (TIMOPTIC) 0.5 % ophthalmic solution Place into the left eye. 05/01/13   Historical Provider, MD  Turmeric 500 MG CAPS Take 1 capsule by mouth daily.    Historical Provider, MD    Family History Family History  Problem Relation Age of Onset  . Alzheimer's disease Mother 19  . Cancer Father 54    sm cell carcinoma lung    . Cancer Sister 42    breast  . Breast cancer Sister   . Hyperlipidemia Paternal Grandfather   . Colon cancer Neg Hx   . Stomach cancer Neg Hx     Social History Social History  Substance Use Topics  . Smoking status: Former Smoker    Years: 0.20    Types: Cigarettes    Quit date: 09/15/1967  . Smokeless tobacco: Never Used  . Alcohol use 4.2 - 6.0 oz/week    7 - 10 Glasses of wine per week     Comment: sometimes nightly     Allergies   Patient has no known allergies.   Review of Systems Review of Systems  Genitourinary: Positive for difficulty urinating.  All other systems reviewed and are negative.    Physical Exam Updated Vital Signs BP 121/74   Pulse 91   Temp 98 F (36.7 C) (Oral)   Resp 16   Ht 6\' 1"  (1.854 m)   Wt 185 lb (83.9 kg)   SpO2 100%   BMI 24.41 kg/m   Physical Exam  Constitutional: He is oriented to person, place, and time. He appears well-developed and well-nourished.  HENT:  Head: Normocephalic and atraumatic.  Right Ear: External ear normal.  Left Ear: External ear normal.  Nose: Nose normal.  Mouth/Throat: Oropharynx is clear and moist.  Eyes: Conjunctivae and EOM are normal. Pupils are equal, round, and reactive to light.  Neck: Normal range of motion. Neck supple.  Cardiovascular: Normal rate, regular rhythm, normal heart sounds and intact distal pulses.   Pulmonary/Chest: Effort normal and breath sounds normal.  Abdominal: Soft. There is tenderness in the suprapubic area.  Musculoskeletal: Normal range of motion.  Neurological: He is alert and oriented to person, place, and time.  Skin: Skin is warm.  Psychiatric: He has a normal mood and affect. His behavior is normal. Judgment and thought content normal.  Nursing note and vitals reviewed.    ED Treatments / Results  Labs (all labs ordered are listed, but only abnormal results are displayed) Labs Reviewed  URINALYSIS, ROUTINE W REFLEX MICROSCOPIC (NOT AT [facility]) -  Abnormal; Notable for the following:       Result Value   APPearance CLOUDY (*)    Hgb urine dipstick SMALL (*)    Ketones, ur 15 (*)    Protein, ur 100 (*)    Nitrite POSITIVE (*)    Leukocytes, UA SMALL (*)    All other components within normal limits  BASIC METABOLIC PANEL - Abnormal; Notable for the following:    Glucose, Bld 126 (*)    BUN  30 (*)    Creatinine, Ser 1.36 (*)    Calcium 8.8 (*)    GFR calc non Af Amer 50 (*)    GFR calc Af Amer 58 (*)    All other components within normal limits  CBC WITH DIFFERENTIAL/PLATELET - Abnormal; Notable for the following:    WBC 20.6 (*)    RBC 3.81 (*)    Hemoglobin 11.8 (*)    HCT 34.4 (*)    Neutro Abs 18.4 (*)    Monocytes Absolute 1.3 (*)    All other components within normal limits  URINE MICROSCOPIC-ADD ON - Abnormal; Notable for the following:    Squamous Epithelial / LPF 0-5 (*)    Bacteria, UA MANY (*)    Casts HYALINE CASTS (*)    All other components within normal limits  URINE CULTURE    EKG  EKG Interpretation None       Radiology No results found.  Procedures Procedures (including critical care time)  Medications Ordered in ED Medications  cephALEXin (KEFLEX) capsule 500 mg (not administered)     Initial Impression / Assessment and Plan / ED Course  I have reviewed the triage vital signs and the nursing notes.  Pertinent labs & imaging results that were available during my care of the patient were reviewed by me and considered in my medical decision making (see chart for details).  Clinical Course    Pt had almost 900 cc of urine in bladder prior to foley placement.  Pt feels much better after foley placed.    Pt has been on 3 rounds of abx.  He was just started with cefdinir 2 days ago.  I will keep him on that and send urine for a culture.  Final Clinical Impressions(s) / ED Diagnoses   Final diagnoses:  Urinary retention  Acute cystitis without hematuria    New Prescriptions New  Prescriptions   CEPHALEXIN (KEFLEX) 500 MG CAPSULE    Take 1 capsule (500 mg total) by mouth 4 (four) times daily.     Isla Pence, MD 11/29/15 [phone]

## 2015-12-01 LAB — URINE CULTURE: Culture: NO GROWTH

## 2015-12-09 DIAGNOSIS — Z466 Encounter for fitting and adjustment of urinary device: Secondary | ICD-10-CM | POA: Diagnosis not present

## 2015-12-16 DIAGNOSIS — Z466 Encounter for fitting and adjustment of urinary device: Secondary | ICD-10-CM | POA: Diagnosis not present

## 2015-12-24 DIAGNOSIS — M1611 Unilateral primary osteoarthritis, right hip: Secondary | ICD-10-CM | POA: Diagnosis not present

## 2016-01-13 DIAGNOSIS — R972 Elevated prostate specific antigen [PSA]: Secondary | ICD-10-CM | POA: Diagnosis not present

## 2016-01-13 DIAGNOSIS — Z87891 Personal history of nicotine dependence: Secondary | ICD-10-CM | POA: Diagnosis not present

## 2016-01-16 IMAGING — MR MR ORBITS WO/W CM
4 of 7 series · 17 of 48 positions shown · IV contrast (multihance)
Comparison: Brain MRI 07/29/2009 and 12/25/2008

CLINICAL DATA: Optic neuropathy.  History of glaucoma.

Creatinine was obtained on site at [HOSPITAL] at [HOSPITAL].Results: Creatinine 1 point to mg/dL.
EXAM:
MRI OF THE ORBITS WITHOUT AND WITH CONTRAST
TECHNIQUE: Multiplanar, multisequence MR imaging of the orbits was performed
both before and after the administration of intravenous contrast.
CONTRAST:  17mL MULTIHANCE GADOBENATE DIMEGLUMINE 529 MG/ML IV SOLN

[Series 6: T1 · sagittal · 4.0mm · 0.75mm/px · 3 of 27 slices shown]
[im 6/27]
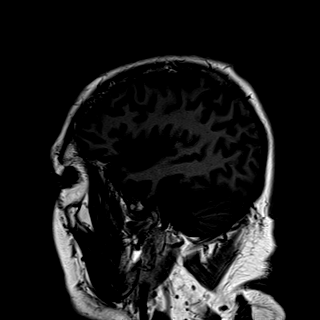
[im 16/27]
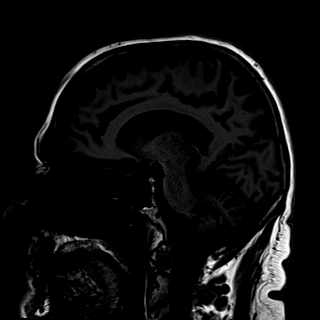
[im 27/27]
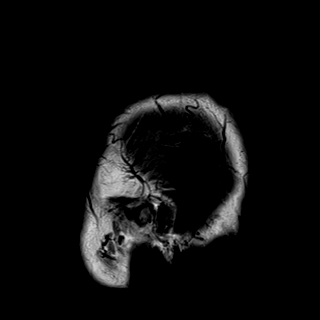

[Series 7: T2 fat-sat · axial · 3.0mm · 0.36mm/px · z∈[-60,+18]mm · 4 of 30 slices shown (1 of 2)]
[im 1/30]
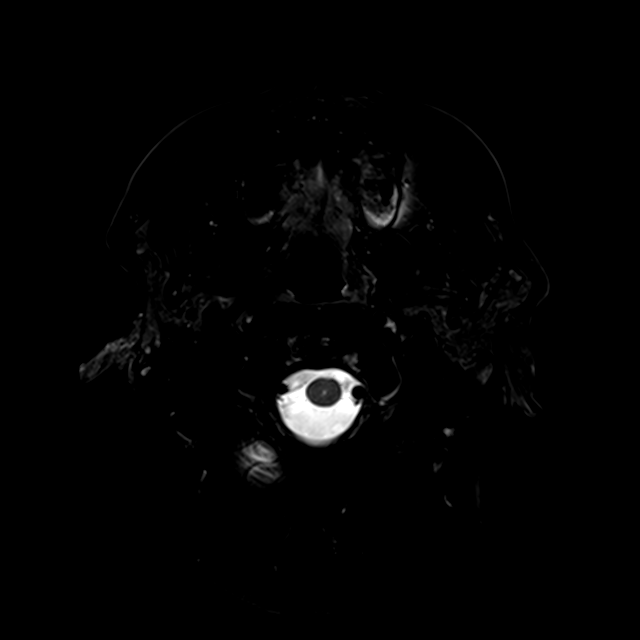
[im 5/30]
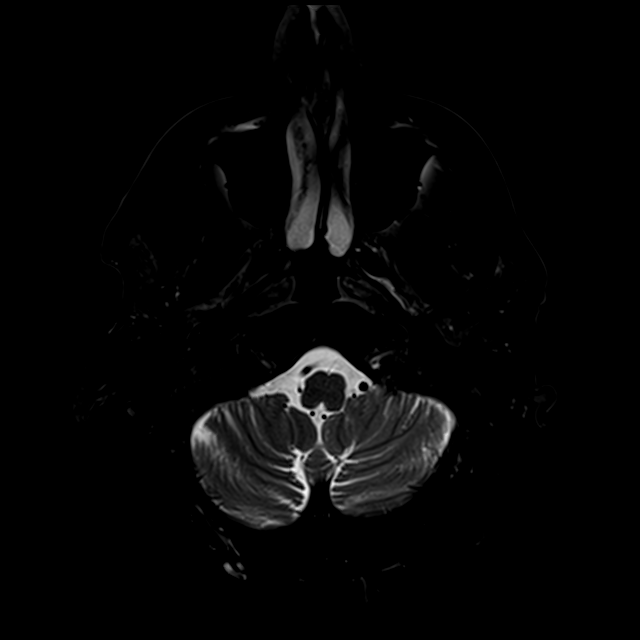
[im 15/30]
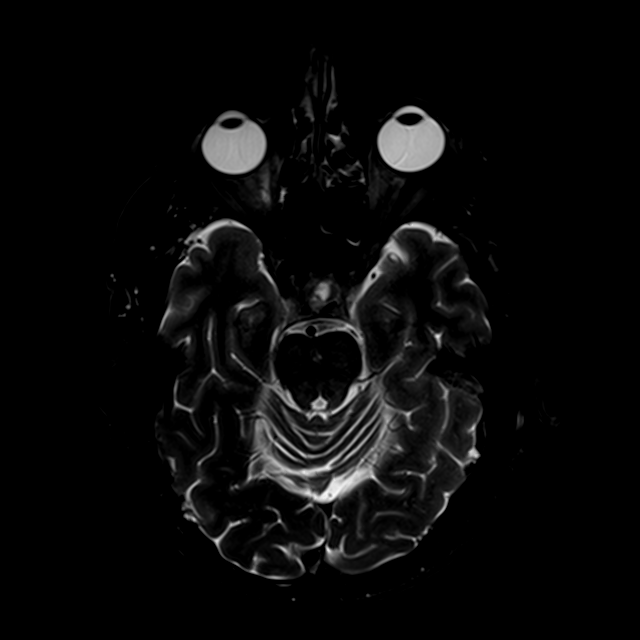
[im 25/30]
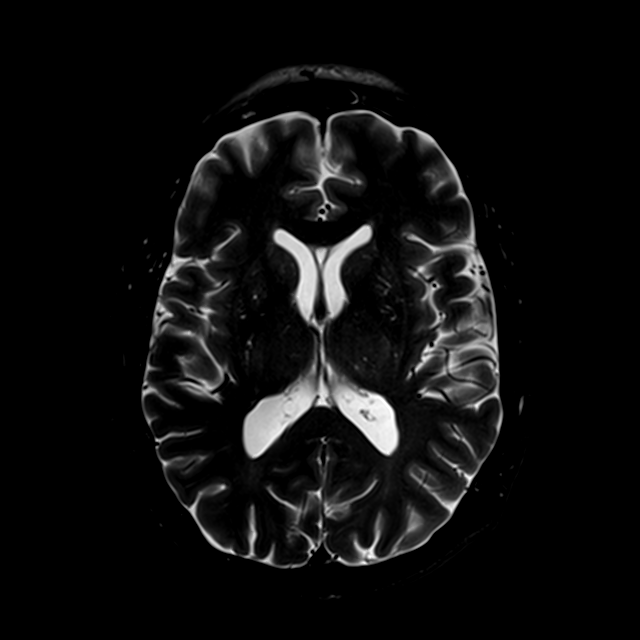

[Series 8: T2 fat-sat · coronal · 3.0mm · 0.36mm/px · 3 of 30 slices shown (2 of 2)]
[im 5/30]
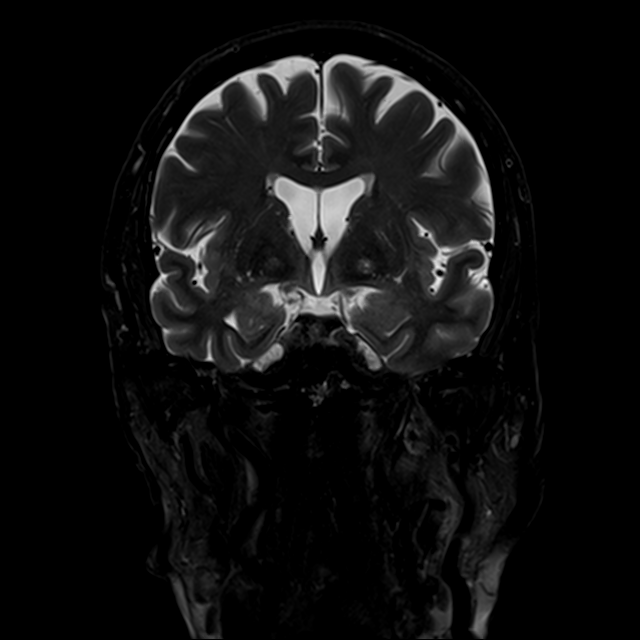
[im 15/30]
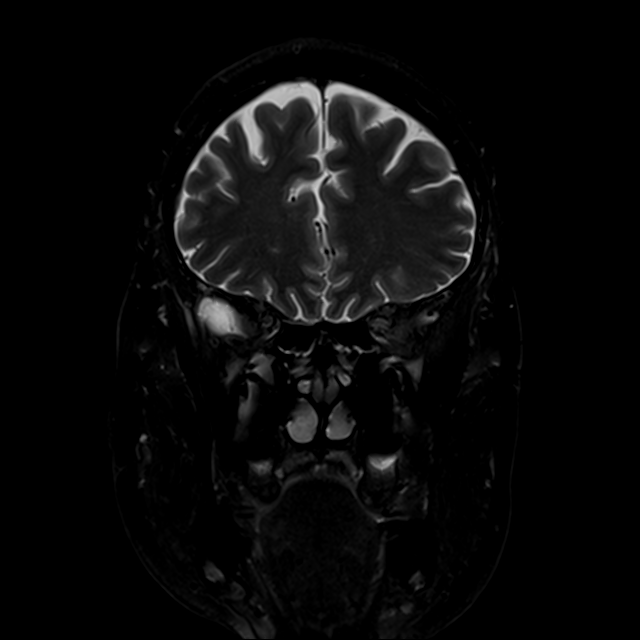
[im 25/30]
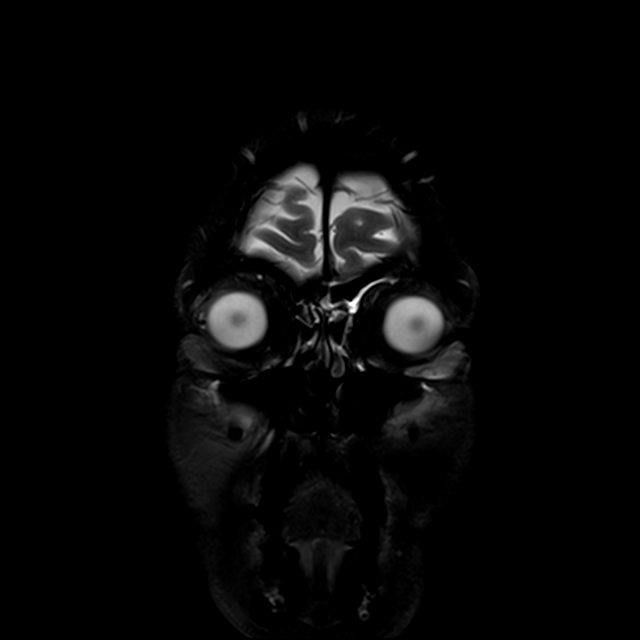

[Series 13: T1 post-contrast · coronal · 3.0mm · 0.35mm/px · 7 of 30 slices shown]
[im 1/30]
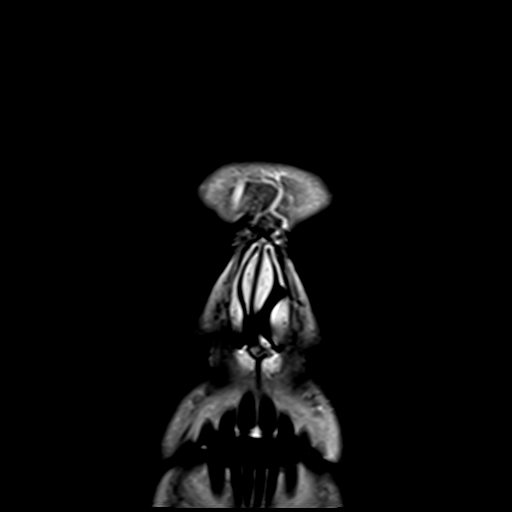
[im 5/30]
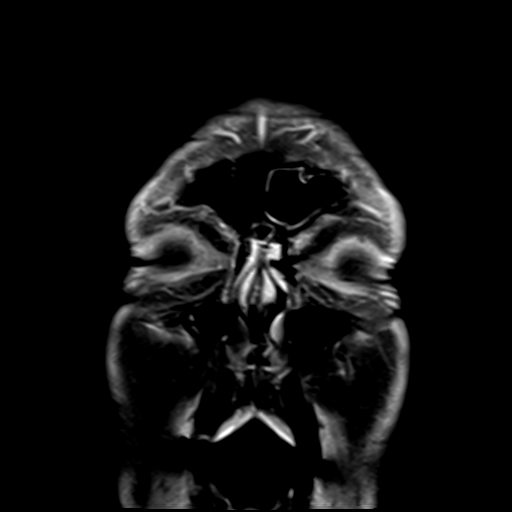
[im 10/30]
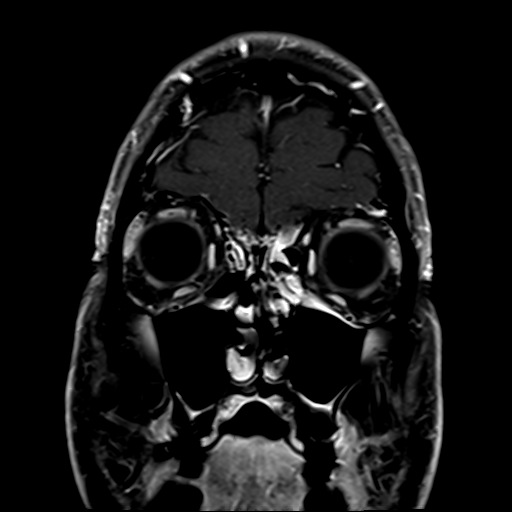
[im 15/30]
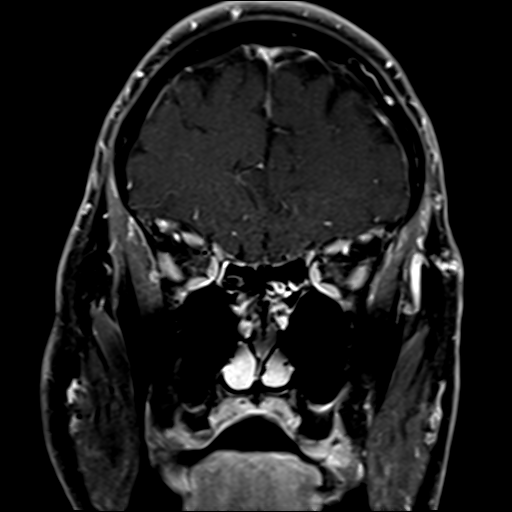
[im 20/30]
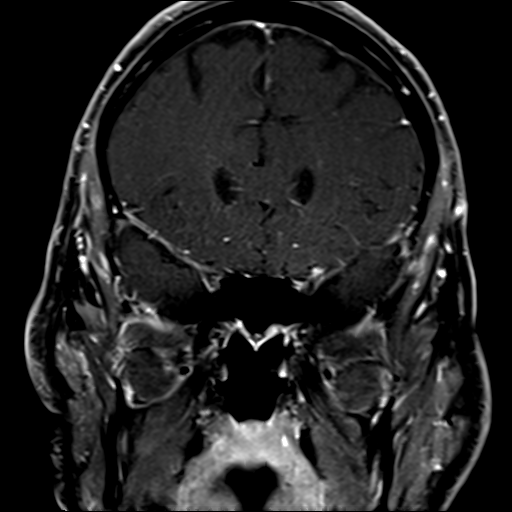
[im 25/30]
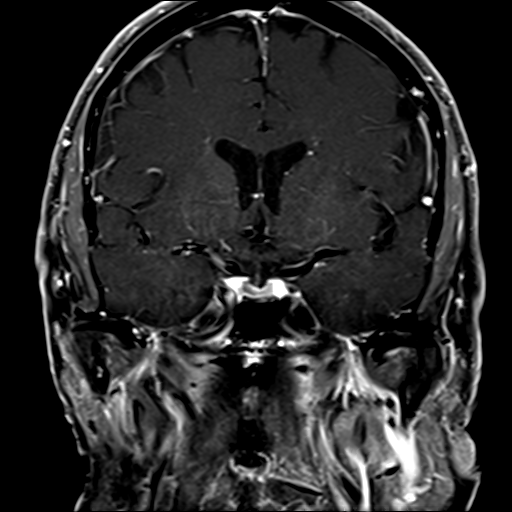
[im 30/30]
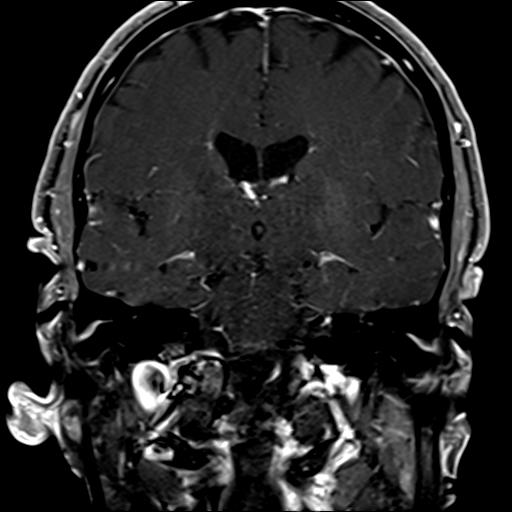

[17 of 48 positions shown; findings below may reference images not displayed]

FINDINGS: The globes appear intact. The extraocular muscles and lacrimal
glands are unremarkable. The no orbital mass or inflammation is
identified.

There is asymmetric atrophy of the left optic nerve with the entire
optic nerve sheath complex being smaller than on the contralateral
side. This likely reflects a change from the prior studies, although
those did not include dedicated orbital imaging. No gross optic
nerve T2 hyperintensity is seen, although evaluation is partially
limited by the nurse small size on the left. No abnormal enhancement
is identified involving the optic nerve sheath complexes
bilaterally. Optic chiasm is normal in appearance.

There is mild mucosal thickening in the left ethmoid greater than
left frontal sinuses. Mastoid air cells are clear. A small, chronic
infarct is again noted at the inferior aspect of the right middle
cerebellar peduncle. There is mild cerebral and cerebellar atrophy.
IMPRESSION: 1. Asymmetric left optic nerve atrophy. No evidence of orbital
inflammation or mass.
2. Chronic right middle cerebellar peduncle infarct.

## 2016-01-29 ENCOUNTER — Telehealth: Payer: Self-pay | Admitting: Family Medicine

## 2016-01-29 NOTE — Telephone Encounter (Signed)
03/08/14 PR PPPS, SUBSEQ VISIT [id number] patient scheduled medicare wellness appointment 04/02/16 at 9:30am with Hoyle Sauer and physical with PCP at 10:30am.

## 2016-02-06 HISTORY — PX: PROSTATE BIOPSY: SHX241

## 2016-02-28 DIAGNOSIS — R972 Elevated prostate specific antigen [PSA]: Secondary | ICD-10-CM | POA: Diagnosis not present

## 2016-02-28 DIAGNOSIS — C61 Malignant neoplasm of prostate: Secondary | ICD-10-CM | POA: Diagnosis not present

## 2016-04-02 ENCOUNTER — Encounter: Payer: Self-pay | Admitting: Family Medicine

## 2016-04-02 ENCOUNTER — Other Ambulatory Visit: Payer: Self-pay | Admitting: Family Medicine

## 2016-04-02 ENCOUNTER — Ambulatory Visit (INDEPENDENT_AMBULATORY_CARE_PROVIDER_SITE_OTHER): Payer: Medicare PPO | Admitting: Family Medicine

## 2016-04-02 VITALS — BP 102/78 | HR 77 | Temp 97.9°F | Resp 16 | Ht 73.0 in | Wt 195.6 lb

## 2016-04-02 DIAGNOSIS — I10 Essential (primary) hypertension: Secondary | ICD-10-CM

## 2016-04-02 DIAGNOSIS — E785 Hyperlipidemia, unspecified: Secondary | ICD-10-CM

## 2016-04-02 DIAGNOSIS — C61 Malignant neoplasm of prostate: Secondary | ICD-10-CM

## 2016-04-02 DIAGNOSIS — D649 Anemia, unspecified: Secondary | ICD-10-CM | POA: Diagnosis not present

## 2016-04-02 DIAGNOSIS — Z23 Encounter for immunization: Secondary | ICD-10-CM

## 2016-04-02 DIAGNOSIS — Z0000 Encounter for general adult medical examination without abnormal findings: Secondary | ICD-10-CM

## 2016-04-02 LAB — CBC WITH DIFFERENTIAL/PLATELET
BASOS ABS: 0.1 10*3/uL (ref 0.0–0.1)
BASOS PCT: 0.9 % (ref 0.0–3.0)
EOS ABS: 0.3 10*3/uL (ref 0.0–0.7)
Eosinophils Relative: 3.4 % (ref 0.0–5.0)
HCT: 43.8 % (ref 39.0–52.0)
Hemoglobin: 14.7 g/dL (ref 13.0–17.0)
LYMPHS ABS: 2.8 10*3/uL (ref 0.7–4.0)
LYMPHS PCT: 34.8 % (ref 12.0–46.0)
MCHC: 33.6 g/dL (ref 30.0–36.0)
MCV: 90 fl (ref 78.0–100.0)
Monocytes Absolute: 0.8 10*3/uL (ref 0.1–1.0)
Monocytes Relative: 10.3 % (ref 3.0–12.0)
NEUTROS ABS: 4 10*3/uL (ref 1.4–7.7)
NEUTROS PCT: 50.6 % (ref 43.0–77.0)
PLATELETS: 191 10*3/uL (ref 150.0–400.0)
RBC: 4.87 Mil/uL (ref 4.22–5.81)
RDW: 15.9 % — AB (ref 11.5–15.5)
WBC: 8 10*3/uL (ref 4.0–10.5)

## 2016-04-02 LAB — POC URINALSYSI DIPSTICK (AUTOMATED)
BILIRUBIN UA: NEGATIVE
GLUCOSE UA: NEGATIVE
Ketones, UA: NEGATIVE
LEUKOCYTES UA: NEGATIVE
NITRITE UA: NEGATIVE
Protein, UA: NEGATIVE
RBC UA: NEGATIVE
Spec Grav, UA: 1.015 (ref 1.030–1.035)
Urobilinogen, UA: NEGATIVE (ref ?–2.0)
pH, UA: 6 (ref 5.0–8.0)

## 2016-04-02 LAB — COMPREHENSIVE METABOLIC PANEL
ALBUMIN: 4.3 g/dL (ref 3.5–5.2)
ALT: 16 U/L (ref 0–53)
AST: 21 U/L (ref 0–37)
Alkaline Phosphatase: 52 U/L (ref 39–117)
BILIRUBIN TOTAL: 0.9 mg/dL (ref 0.2–1.2)
BUN: 21 mg/dL (ref 6–23)
CHLORIDE: 102 meq/L (ref 96–112)
CO2: 29 meq/L (ref 19–32)
Calcium: 9.3 mg/dL (ref 8.4–10.5)
Creatinine, Ser: 1.04 mg/dL (ref 0.40–1.50)
GFR: 74.53 mL/min (ref >60.00)
Glucose, Bld: 99 mg/dL (ref 70–99)
Potassium: 4.4 mEq/L (ref 3.5–5.1)
Sodium: 139 mEq/L (ref 135–145)
Total Protein: 6.4 g/dL (ref 6.0–8.3)

## 2016-04-02 LAB — LIPID PANEL
CHOL/HDL RATIO: 3
CHOLESTEROL: 164 mg/dL (ref 0–200)
HDL: 59.2 mg/dL (ref >39.00)
LDL Cholesterol: 83 mg/dL (ref 0–99)
NonHDL: 104.94
TRIGLYCERIDES: 109 mg/dL (ref 0.0–149.0)
VLDL: 21.8 mg/dL (ref 0.0–40.0)

## 2016-04-02 LAB — IBC PANEL
Iron: 115 ug/dL (ref 42–165)
Saturation Ratios: 25.6 % (ref 20.0–50.0)
Transferrin: 321 mg/dL (ref 212.0–360.0)

## 2016-04-02 LAB — FERRITIN: FERRITIN: 36.4 ng/mL (ref 22.0–322.0)

## 2016-04-02 LAB — PSA: PSA: 6.18 ng/mL — ABNORMAL HIGH (ref 0.10–4.00)

## 2016-04-02 MED ORDER — ZOSTER VAC RECOMB ADJUVANTED 50 MCG/0.5ML IM SUSR: 0.5000 mL | Freq: Once | INTRAMUSCULAR | 1 refills | Status: AC

## 2016-04-02 NOTE — Assessment & Plan Note (Signed)
Tolerating statin, encouraged heart healthy diet, avoid trans fats, minimize simple carbs and saturated fats. Increase exercise as tolerated 

## 2016-04-02 NOTE — Progress Notes (Signed)
Pre visit review using our clinic review tool, if applicable. No additional management support is needed unless otherwise documented below in the visit note. 

## 2016-04-02 NOTE — Progress Notes (Signed)
Patient ID: Antonio Stephens, male   DOB: 03/07/1945, 71 y.o.   MRN: [id number]     Subjective:    Patient ID: Antonio Stephens, male    DOB: Dec 22, 1945, 80 y.o.   MRN: [id number]  Chief Complaint  Patient presents with  . Medicare Wellness    w/ RN  . Annual Exam    Fasting, no concerns.    HPI  Patient is in today for annual exam.  He has no other concerns today.   Had prostate biopsied about a month ago.  Biopsy showed non-aggressive prostate cancer.    Patient Care Team: Ann Held, DO as PCP - General Marcina Millard, MD as Consulting Physician (Orthopedic Surgery) Timoteo Gaul, MD as Referring Physician (Urology)   Past Medical History:  Diagnosis Date  . Back pain   . 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 Depression   . Glaucoma    bilateral  . Hyperlipidemia   . Hypertension   . Urinary retention     Past Surgical History:  Procedure Laterality Date  . COLONOSCOPY    . HIP RESURFACING Right 11/04/2015  . inguinal Herniorrhaphy     left  . KNEE ARTHROSCOPY  04/2005   right  . KNEE SURGERY  09-2009   partial replacemente , R  . PARTIAL KNEE ARTHROPLASTY Left 10.10.16  . PROSTATE BIOPSY  02/2016  . TOTAL HIP ARTHROPLASTY  12/16/09   left hip  . 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     benign/fatty tumor    Family History  Problem Relation Age of Onset  . Alzheimer's disease Mother 78  . Cancer Father 87    sm cell carcinoma lung  . Cancer Sister 58    breast  . Breast cancer Sister   . Hyperlipidemia Paternal Grandfather   . Colon cancer Neg Hx   . Stomach cancer Neg Hx     Social History   Social History  . Marital status: Married    Spouse name: N/A  . Number of children: N/A  . Years of education: N/A   Occupational History  . Sales textiles    Social History Main Topics  . Smoking status: Former Smoker    Years: 0.20    Types:  Cigarettes    Quit date: 09/15/1967  . Smokeless tobacco: Never Used  . Alcohol use 4.2 - 6.0 oz/week    7 - 10 Glasses of wine per week     Comment: sometimes nightly  . Drug use: No  . Sexual activity: Yes   Other Topics Concern  . Not on file   Social History Narrative  . No narrative on file    Outpatient Medications Prior to Visit  Medication Sig Dispense Refill  . alendronate (FOSAMAX) 70 MG tablet Take 70 mg by mouth once a week.  5  . aspirin 81 MG tablet Take 81 mg by mouth daily.    . Cholecalciferol (VITAMIN D3) 5000 units CAPS Take 1 capsule by mouth daily.    . Glucosamine-Chondroit-Vit C-Mn (GLUCOSAMINE CHONDR 1500 COMPLX PO) Take 1 tablet by mouth 2 (two) times daily.     Marland Kitchen lisinopril (PRINIVIL,ZESTRIL) 20 MG tablet TAKE 1 TABLET BY MOUTH EVERY DAY 30 tablet 5  . multivitamin (THERAGRAN) per tablet Take 1 tablet by mouth daily.      . Omega 3 1000 MG CAPS Take 1  capsule by mouth 2 (two) times daily.    . rosuvastatin (CRESTOR) 20 MG tablet TAKE 1 TABLET BY MOUTH DAILY 30 tablet 5  . sertraline (ZOLOFT) 100 MG tablet Take 100 mg by mouth daily.      . timolol (TIMOPTIC) 0.5 % ophthalmic solution Place 1 drop into the left eye 2 (two) times daily.     . Turmeric 500 MG CAPS Take 1 capsule by mouth 2 (two) times daily.     . ciprofloxacin (CIPRO) 500 MG tablet Take 1 tablet (500 mg total) by mouth 2 (two) times daily. 28 tablet 0  . ELIQUIS 2.5 MG TABS tablet Take 2.5 mg by mouth 2 (two) times daily.  0  . Garlic-Calcium (BL GARLIC PO) Take 1,761 mg by mouth daily.    . meloxicam (MOBIC) 15 MG tablet Take 15 mg by mouth daily.  0  . pantoprazole (PROTONIX) 40 MG tablet Take 40 mg by mouth daily.  0   No facility-administered medications prior to visit.     No Known Allergies  Review of Systems  Constitutional: Negative for fever and malaise/fatigue.  HENT: Negative for congestion.   Eyes: Negative for blurred vision.  Respiratory: Negative for cough and  shortness of breath.   Cardiovascular: Negative for chest pain, palpitations and leg swelling.  Gastrointestinal: Negative for vomiting.  Musculoskeletal: Negative for back pain.  Skin: Negative for rash.  Neurological: Negative for loss of consciousness and headaches.       Objective:    Physical Exam  Constitutional: He appears well-developed and well-nourished. No distress.  HENT:  Head: Normocephalic and atraumatic.  Eyes: Conjunctivae are normal.  Neck: Normal range of motion. No thyromegaly present.  Cardiovascular: Normal rate and regular rhythm.   Pulmonary/Chest: Effort normal. He has no wheezes.  Abdominal: Soft. Bowel sounds are normal. There is no tenderness.  Musculoskeletal: Normal range of motion. He exhibits no edema or deformity.  Neurological: He is alert.  Skin: Skin is warm and dry. He is not diaphoretic.  Has bruise on right lower arm.  Psychiatric: He has a normal mood and affect.    BP 102/78   Pulse 77   Temp 97.9 F (36.6 C) (Oral)   Resp 16   Ht 6\' 1"  (1.854 m)   Wt 195 lb 9.6 oz (88.7 kg)   SpO2 98%   BMI 25.81 kg/m  Wt Readings from Last 3 Encounters:  04/02/16 195 lb 9.6 oz (88.7 kg)  11/29/15 185 lb (83.9 kg)  11/14/15 196 lb 3.2 oz (89 kg)   BP Readings from Last 3 Encounters:  04/02/16 102/78  11/29/15 119/62  11/14/15 102/68     Immunization History  Administered Date(s) Administered  . Pneumococcal Conjugate-13 03/08/2014  . Pneumococcal Polysaccharide-23 09/05/2009  . Td 09/05/2009  . Zoster 12/10/2010    Health Maintenance  Topic Date Due  . INFLUENZA VACCINE  04/04/2016 (Originally 08/06/2015)  . COLONOSCOPY  06/07/2018  . TETANUS/TDAP  09/06/2019  . Hepatitis C Screening  Completed  . PNA vac Low Risk Adult  Completed    Lab Results  Component Value Date   WBC 20.6 (H) 11/29/2015   HGB 11.8 (L) 11/29/2015   HCT 34.4 (L) 11/29/2015   PLT 242 11/29/2015   GLUCOSE 126 (H) 11/29/2015   CHOL 198 03/11/2015   TRIG  103.0 03/11/2015   HDL 64.20 03/11/2015   LDLDIRECT 117.0 07/27/2006   LDLCALC 113 (H) 03/11/2015   ALT 36 11/14/2015   AST 31  11/14/2015   NA 135 11/29/2015   K 3.9 11/29/2015   CL 101 11/29/2015   CREATININE 1.36 (H) 11/29/2015   BUN 30 (H) 11/29/2015   CO2 24 11/29/2015   TSH 1.83 11/05/2011   PSA 5.93 (H) 11/14/2015   INR 0.91 07/29/2009   HGBA1C 5.7 11/14/2015   MICROALBUR 1.2 12/10/2009    Lab Results  Component Value Date   TSH 1.83 11/05/2011   Lab Results  Component Value Date   WBC 20.6 (H) 11/29/2015   HGB 11.8 (L) 11/29/2015   HCT 34.4 (L) 11/29/2015   MCV 90.3 11/29/2015   PLT 242 11/29/2015   Lab Results  Component Value Date   NA 135 11/29/2015   K 3.9 11/29/2015   CO2 24 11/29/2015   GLUCOSE 126 (H) 11/29/2015   BUN 30 (H) 11/29/2015   CREATININE 1.36 (H) 11/29/2015   BILITOT 0.8 11/14/2015   ALKPHOS 52 11/14/2015   AST 31 11/14/2015   ALT 36 11/14/2015   PROT 6.2 11/14/2015   ALBUMIN 3.9 11/14/2015   CALCIUM 8.8 (L) 11/29/2015   ANIONGAP 10 11/29/2015   GFR 83.84 11/14/2015   Lab Results  Component Value Date   CHOL 198 03/11/2015   Lab Results  Component Value Date   HDL 64.20 03/11/2015   Lab Results  Component Value Date   LDLCALC 113 (H) 03/11/2015   Lab Results  Component Value Date   TRIG 103.0 03/11/2015   Lab Results  Component Value Date   CHOLHDL 3 03/11/2015   Lab Results  Component Value Date   HGBA1C 5.7 11/14/2015         Assessment & Plan:   Problem List Items Addressed This Visit      Unprioritized   Essential hypertension    Well controlled, no changes to meds. Encouraged heart healthy diet such as the DASH diet and exercise as tolerated.       Relevant Orders   POCT Urinalysis Dipstick (Automated) (Completed)   Comprehensive metabolic panel   Hyperlipidemia    Tolerating statin, encouraged heart healthy diet, avoid trans fats, minimize simple carbs and saturated fats. Increase exercise as  tolerated      Relevant Orders   Lipid panel   POCT Urinalysis Dipstick (Automated) (Completed)   Preventative health care - Primary    ghm utd Check labs  See AVS      Prostate cancer [facility])    Per [facility]      Relevant Orders   PSA   POCT Urinalysis Dipstick (Automated) (Completed)    Other Visit Diagnoses    Anemia, unspecified type       Relevant Orders   CBC with Differential/Platelet   Ferritin   IBC panel   Need for shingles vaccine       Relevant Medications   Zoster Vac Recomb Adjuvanted ([place]) injection   Encounter for Medicare annual wellness exam          I have discontinued Mr. Haslem's Garlic-Calcium (BL GARLIC PO), ELIQUIS, meloxicam, pantoprazole, and ciprofloxacin. I am also having him start on Zoster Vac Recomb Adjuvanted. Additionally, I am having him maintain his Glucosamine-Chondroit-Vit C-Mn (GLUCOSAMINE CHONDR 1500 COMPLX PO), multivitamin, sertraline, Turmeric, Omega 3, timolol, aspirin, Vitamin D3, rosuvastatin, lisinopril, alendronate, tamsulosin, and Calcium Carbonate-Vit D-Min (CALCIUM 1200 PO).  Meds ordered this encounter  Medications  . tamsulosin (FLOMAX) 0.4 MG CAPS capsule    Sig: Take 1 capsule by mouth daily.  . Calcium Carbonate-Vit D-Min (CALCIUM 1200  PO)    Sig: Take by mouth daily.  Marland Kitchen Zoster Vac Recomb Adjuvanted [facility]) injection    Sig: Inject 0.5 mLs into the muscle once.    Dispense:  1 each    Refill:  1    CMA served as scribe during this visit. History, Physical and Plan performed by medical provider. Documentation and orders reviewed and attested to.  Ann Held, DO

## 2016-04-02 NOTE — Progress Notes (Signed)
Subjective:   Antonio Stephens is a 71 y.o. male who presents for Medicare Annual/Subsequent preventive examination.  He follows w/ [facility] and was recently diagnosed w/ prostate CA. He has elected to proceed w/ active surveillance for now.   Review of Systems:  No ROS.  Medicare Wellness Visit.  Cardiac Risk Factors include: advanced age (>27men, >58 women);male gender  Sleep patterns: no sleep issues.   Home Safety/Smoke Alarms: Feels safe in home. Smoke alarms in place.   Living environment; residence and Firearm Safety: Lives w/ wife. 2-story house, no firearms. Seat Belt Safety/Bike Helmet: Wears seat belt.   Counseling:   Eye Exam- Dr. Talbert Forest' office yearly. Dental- Dr. Doreene Burke twice yearly.  Male:   CCS- last 06/06/13. 3 polyps removed, moderate diverticulosis. 5 year recall.     PSA- Follows w/ [facility] for elevated PSA/prostate CA Lab Results  Component Value Date   PSA 5.93 (H) 11/14/2015   PSA 5.62 (H) 03/11/2015   PSA 1.83 11/01/2014        Objective:    Vitals: BP 102/78   Pulse 77   Temp 97.9 F (36.6 C) (Oral)   Resp 16   Ht 6\' 1"  (6.812 m)   Wt 195 lb 9.6 oz (88.7 kg)   SpO2 98%   BMI 25.81 kg/m   Body mass index is 25.81 kg/m.  Tobacco History  Smoking Status  . Former Smoker  . Years: 0.20  . Types: Cigarettes  . Quit date: 09/15/1967  Smokeless Tobacco  . Never Used     Counseling given: Not Answered   Past Medical History:  Diagnosis Date  . Back pain   . 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 Depression   . Glaucoma    bilateral  . Hyperlipidemia   . Hypertension   . Urinary retention    Past Surgical History:  Procedure Laterality Date  . COLONOSCOPY    . HIP RESURFACING Right 11/04/2015  . inguinal Herniorrhaphy     left  . KNEE ARTHROSCOPY  04/2005   right  . KNEE SURGERY  09-2009   partial replacemente , R  . PARTIAL  KNEE ARTHROPLASTY Left 10.10.16  . PROSTATE BIOPSY  02/2016  . TOTAL HIP ARTHROPLASTY  12/16/09   left hip  . 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     benign/fatty tumor   Family History  Problem Relation Age of Onset  . Alzheimer's disease Mother 68  . Cancer Father 41    sm cell carcinoma lung  . Cancer Sister 53    breast  . Breast cancer Sister   . Hyperlipidemia Paternal Grandfather   . Colon cancer Neg Hx   . Stomach cancer Neg Hx    History  Sexual Activity  . Sexual activity: Yes    Outpatient Encounter Prescriptions as of 04/02/2016  Medication Sig  . alendronate (FOSAMAX) 70 MG tablet Take 70 mg by mouth once a week.  Marland Kitchen aspirin 81 MG tablet Take 81 mg by mouth daily.  . Calcium Carbonate-Vit D-Min (CALCIUM 1200 PO) Take by mouth daily.  . Cholecalciferol (VITAMIN D3) 5000 units CAPS Take 1 capsule by mouth daily.  . Glucosamine-Chondroit-Vit C-Mn (GLUCOSAMINE CHONDR 1500 COMPLX PO) Take 1 tablet by mouth 2 (two) times daily.   Marland Kitchen lisinopril (PRINIVIL,ZESTRIL) 20 MG tablet TAKE 1 TABLET BY MOUTH EVERY DAY  . multivitamin (THERAGRAN) per tablet Take  1 tablet by mouth daily.    . Omega 3 1000 MG CAPS Take 1 capsule by mouth 2 (two) times daily.  . rosuvastatin (CRESTOR) 20 MG tablet TAKE 1 TABLET BY MOUTH DAILY  . sertraline (ZOLOFT) 100 MG tablet Take 100 mg by mouth daily.    . tamsulosin (FLOMAX) 0.4 MG CAPS capsule Take 1 capsule by mouth daily.  . timolol (TIMOPTIC) 0.5 % ophthalmic solution Place 1 drop into the left eye 2 (two) times daily.   . Turmeric 500 MG CAPS Take 1 capsule by mouth 2 (two) times daily.   . [DISCONTINUED] ciprofloxacin (CIPRO) 500 MG tablet Take 1 tablet (500 mg total) by mouth 2 (two) times daily.  . [DISCONTINUED] ELIQUIS 2.5 MG TABS tablet Take 2.5 mg by mouth 2 (two) times daily.  . [DISCONTINUED] Garlic-Calcium (BL GARLIC PO) Take 3,818 mg by mouth daily.  . [DISCONTINUED] meloxicam (MOBIC) 15 MG tablet Take 15 mg by mouth daily.  .  [DISCONTINUED] pantoprazole (PROTONIX) 40 MG tablet Take 40 mg by mouth daily.   No facility-administered encounter medications on file as of 04/02/2016.     Activities of Daily Living In your present state of health, do you have any difficulty performing the following activities: 04/02/2016  Hearing? N  Vision? N  Difficulty concentrating or making decisions? N  Walking or climbing stairs? N  Dressing or bathing? N  Doing errands, shopping? N  Preparing Food and eating ? N  Using the Toilet? N  In the past six months, have you accidently leaked urine? Y  Do you have problems with loss of bowel control? N  Managing your Medications? N  Managing your Finances? N  Housekeeping or managing your Housekeeping? N  Some recent data might be hidden    Patient Care Team: Ann Held, DO as PCP - General Marcina Millard, MD as Consulting Physician (Orthopedic Surgery) Timoteo Gaul, MD as Referring Physician (Urology)   Assessment:    Physical assessment deferred to PCP.  Exercise Activities and Dietary recommendations Type of exercise: Other - see comments;walking (swimming), Frequency (Times/Week): 7  Diet (meal preparation, eat out, water intake, caffeinated beverages, dairy products, fruits and vegetables): in general, a "healthy" diet  , well balanced, on average, 3 meals per day. Likes salads, fruits, vegetables, fish. Does not eat much red meat. Drinks lots of water.  Goals    . Healthy Lifestyle          Continue to eat heart healthy diet (full of fruits, vegetables, whole grains, lean protein, water--limit salt, fat, and sugar intake) and increase physical activity as tolerated. Continue doing brain stimulating activities (puzzles, reading, adult coloring books, staying active) to keep memory sharp.       Fall Risk Fall Risk  04/02/2016 03/11/2015 03/08/2014 02/02/2013  Falls in the past year? No No No No   Depression Screen PHQ 2/9 Scores 04/02/2016  03/11/2015 03/08/2014 02/02/2013  PHQ - 2 Score 0 0 0 0    Cognitive Function MMSE - Mini Mental State Exam 04/02/2016  Orientation to time 5  Orientation to Place 5  Registration 3  Attention/ Calculation 4  Recall 3  Language- name 2 objects 2  Language- repeat 1  Language- follow 3 step command 3  Language- read & follow direction 1  Write a sentence 1  Copy design 1  Total score 29        Immunization History  Administered Date(s) Administered  . Pneumococcal Conjugate-13 03/08/2014  .  Pneumococcal Polysaccharide-23 09/05/2009  . Td 09/05/2009  . Zoster 12/10/2010   Screening Tests Health Maintenance  Topic Date Due  . INFLUENZA VACCINE  04/04/2016 (Originally 08/06/2015)  . COLONOSCOPY  06/07/2018  . TETANUS/TDAP  09/06/2019  . Hepatitis C Screening  Completed  . PNA vac Low Risk Adult  Completed      Plan:   Follow-up w/ PCP as directed.  Bring a copy of your advance directives to your next office visit.  Continue to eat heart healthy diet (full of fruits, vegetables, whole grains, lean protein, water--limit salt, fat, and sugar intake) and increase physical activity as tolerated.  During the course of the visit the patient was educated and counseled about the following appropriate screening and preventive services:   Vaccines to include Pneumococcal, Influenza, Td, HCV  Cardiovascular Disease  Colorectal cancer screening  Diabetes screening  Prostate Cancer Screening  Glaucoma screening  Nutrition counseling   Patient Instructions (the written plan) was given to the patient.    Dorrene German, RN  04/02/2016

## 2016-04-02 NOTE — Patient Instructions (Addendum)
Bring a copy of your advance directives to your next office visit.   Preventive Care 19 Years and Older, Male Preventive care refers to lifestyle choices and visits with your health care provider that can promote health and wellness. What does preventive care include?  A yearly physical exam. This is also called an annual well check.  Dental exams once or twice a year.  Routine eye exams. Ask your health care provider how often you should have your eyes checked.  Personal lifestyle choices, including:  Daily care of your teeth and gums.  Regular physical activity.  Eating a healthy diet.  Avoiding tobacco and drug use.  Limiting alcohol use.  Practicing safe sex.  Taking low doses of aspirin every day.  Taking vitamin and mineral supplements as recommended by your health care provider. What happens during an annual well check? The services and screenings done by your health care provider during your annual well check will depend on your age, overall health, lifestyle risk factors, and family history of disease. Counseling  Your health care provider may ask you questions about your:  Alcohol use.  Tobacco use.  Drug use.  Emotional well-being.  Home and relationship well-being.  Sexual activity.  Eating habits.  History of falls.  Memory and ability to understand (cognition).  Work and work Statistician. Screening  You may have the following tests or measurements:  Height, weight, and BMI.  Blood pressure.  Lipid and cholesterol levels. These may be checked every 5 years, or more frequently if you are over 73 years old.  Skin check.  Lung cancer screening. You may have this screening every year starting at age 73 if you have a 30-pack-year history of smoking and currently smoke or have quit within the past 15 years.  Fecal occult blood test (FOBT) of the stool. You may have this test every year starting at age 73.  Flexible sigmoidoscopy or  colonoscopy. You may have a sigmoidoscopy every 5 years or a colonoscopy every 10 years starting at age 73.  Prostate cancer screening. Recommendations will vary depending on your family history and other risks.  Hepatitis C blood test.  Hepatitis B blood test.  Sexually transmitted disease (STD) testing.  Diabetes screening. This is done by checking your blood sugar (glucose) after you have not eaten for a while (fasting). You may have this done every 1-3 years.  Abdominal aortic aneurysm (AAA) screening. You may need this if you are a current or former smoker.  Osteoporosis. You may be screened starting at age 73 if you are at high risk. Talk with your health care provider about your test results, treatment options, and if necessary, the need for more tests. Vaccines  Your health care provider may recommend certain vaccines, such as:  Influenza vaccine. This is recommended every year.  Tetanus, diphtheria, and acellular pertussis (Tdap, Td) vaccine. You may need a Td booster every 10 years.  Varicella vaccine. You may need this if you have not been vaccinated.  Zoster vaccine. You may need this after age 73.  Measles, mumps, and rubella (MMR) vaccine. You may need at least one dose of MMR if you were born in 1957 or later. You may also need a second dose.  Pneumococcal 13-valent conjugate (PCV13) vaccine. One dose is recommended after age 73.  Pneumococcal polysaccharide (PPSV23) vaccine. One dose is recommended after age 35.  Meningococcal vaccine. You may need this if you have certain conditions.  Hepatitis A vaccine. You may need this  if you have certain conditions or if you travel or work in places where you may be exposed to hepatitis A.  Hepatitis B vaccine. You may need this if you have certain conditions or if you travel or work in places where you may be exposed to hepatitis B.  Haemophilus influenzae type b (Hib) vaccine. You may need this if you have certain risk  factors. Talk to your health care provider about which screenings and vaccines you need and how often you need them. This information is not intended to replace advice given to you by your health care provider. Make sure you discuss any questions you have with your health care provider. Document Released: 01/18/2015 Document Revised: 09/11/2015 Document Reviewed: 10/23/2014 Elsevier Interactive Patient Education  2017 [employer].

## 2016-04-02 NOTE — Assessment & Plan Note (Signed)
Check labs 

## 2016-04-02 NOTE — Assessment & Plan Note (Signed)
Per [facility] urology

## 2016-04-02 NOTE — Assessment & Plan Note (Signed)
Well controlled, no changes to meds. Encouraged heart healthy diet such as the DASH diet and exercise as tolerated.  °

## 2016-04-02 NOTE — Assessment & Plan Note (Signed)
ghm utd Check labs See AVS 

## 2016-04-23 DIAGNOSIS — H18413 Arcus senilis, bilateral: Secondary | ICD-10-CM | POA: Diagnosis not present

## 2016-04-23 DIAGNOSIS — H401231 Low-tension glaucoma, bilateral, mild stage: Secondary | ICD-10-CM | POA: Diagnosis not present

## 2016-04-23 DIAGNOSIS — H2513 Age-related nuclear cataract, bilateral: Secondary | ICD-10-CM | POA: Diagnosis not present

## 2016-04-23 DIAGNOSIS — H02839 Dermatochalasis of unspecified eye, unspecified eyelid: Secondary | ICD-10-CM | POA: Diagnosis not present

## 2016-05-11 ENCOUNTER — Other Ambulatory Visit: Payer: Self-pay | Admitting: Family Medicine

## 2016-05-20 ENCOUNTER — Other Ambulatory Visit: Payer: Self-pay | Admitting: Family Medicine

## 2016-07-03 DIAGNOSIS — C61 Malignant neoplasm of prostate: Secondary | ICD-10-CM | POA: Diagnosis not present

## 2016-07-10 DIAGNOSIS — C61 Malignant neoplasm of prostate: Secondary | ICD-10-CM | POA: Diagnosis not present

## 2016-07-20 DIAGNOSIS — C61 Malignant neoplasm of prostate: Secondary | ICD-10-CM | POA: Diagnosis not present

## 2016-09-28 DIAGNOSIS — C61 Malignant neoplasm of prostate: Secondary | ICD-10-CM | POA: Diagnosis not present

## 2016-10-02 ENCOUNTER — Other Ambulatory Visit (INDEPENDENT_AMBULATORY_CARE_PROVIDER_SITE_OTHER): Payer: Medicare PPO

## 2016-10-02 DIAGNOSIS — E785 Hyperlipidemia, unspecified: Secondary | ICD-10-CM

## 2016-10-02 LAB — COMPREHENSIVE METABOLIC PANEL
ALT: 16 U/L (ref 0–53)
AST: 18 U/L (ref 0–37)
Albumin: 4.1 g/dL (ref 3.5–5.2)
Alkaline Phosphatase: 52 U/L (ref 39–117)
BILIRUBIN TOTAL: 0.7 mg/dL (ref 0.2–1.2)
BUN: 21 mg/dL (ref 6–23)
CO2: 30 meq/L (ref 19–32)
CREATININE: 1.03 mg/dL (ref 0.40–1.50)
Calcium: 9.1 mg/dL (ref 8.4–10.5)
Chloride: 102 mEq/L (ref 96–112)
GFR: 75.26 mL/min (ref >60.00)
GLUCOSE: 111 mg/dL — AB (ref 70–99)
Potassium: 4 mEq/L (ref 3.5–5.1)
Sodium: 137 mEq/L (ref 135–145)
Total Protein: 6.3 g/dL (ref 6.0–8.3)

## 2016-10-02 LAB — LIPID PANEL
CHOL/HDL RATIO: 3
Cholesterol: 149 mg/dL (ref 0–200)
HDL: 54.6 mg/dL (ref >39.00)
LDL CALC: 74 mg/dL (ref 0–99)
NONHDL: 94.05
TRIGLYCERIDES: 101 mg/dL (ref 0.0–149.0)
VLDL: 20.2 mg/dL (ref 0.0–40.0)

## 2016-10-05 ENCOUNTER — Other Ambulatory Visit: Payer: Self-pay | Admitting: Family Medicine

## 2016-10-05 DIAGNOSIS — E119 Type 2 diabetes mellitus without complications: Secondary | ICD-10-CM

## 2016-10-05 DIAGNOSIS — E785 Hyperlipidemia, unspecified: Secondary | ICD-10-CM

## 2016-10-08 DIAGNOSIS — C61 Malignant neoplasm of prostate: Secondary | ICD-10-CM | POA: Diagnosis not present

## 2016-11-03 DIAGNOSIS — C61 Malignant neoplasm of prostate: Secondary | ICD-10-CM | POA: Diagnosis not present

## 2016-11-05 DIAGNOSIS — M25561 Pain in right knee: Secondary | ICD-10-CM | POA: Diagnosis not present

## 2016-11-05 DIAGNOSIS — R102 Pelvic and perineal pain: Secondary | ICD-10-CM | POA: Diagnosis not present

## 2016-11-05 DIAGNOSIS — M25562 Pain in left knee: Secondary | ICD-10-CM | POA: Diagnosis not present

## 2016-11-06 DIAGNOSIS — H401231 Low-tension glaucoma, bilateral, mild stage: Secondary | ICD-10-CM | POA: Diagnosis not present

## 2016-11-06 DIAGNOSIS — H04123 Dry eye syndrome of bilateral lacrimal glands: Secondary | ICD-10-CM | POA: Diagnosis not present

## 2016-11-28 ENCOUNTER — Other Ambulatory Visit: Payer: Self-pay | Admitting: Family Medicine

## 2017-01-02 ENCOUNTER — Other Ambulatory Visit: Payer: Self-pay | Admitting: Family Medicine

## 2017-01-07 ENCOUNTER — Other Ambulatory Visit: Payer: Self-pay | Admitting: Family Medicine

## 2017-01-10 ENCOUNTER — Other Ambulatory Visit: Payer: Self-pay | Admitting: Family Medicine

## 2017-02-06 ENCOUNTER — Other Ambulatory Visit: Payer: Self-pay | Admitting: Family Medicine

## 2017-02-08 DIAGNOSIS — H2511 Age-related nuclear cataract, right eye: Secondary | ICD-10-CM | POA: Diagnosis not present

## 2017-02-08 DIAGNOSIS — H04123 Dry eye syndrome of bilateral lacrimal glands: Secondary | ICD-10-CM | POA: Diagnosis not present

## 2017-03-07 ENCOUNTER — Other Ambulatory Visit: Payer: Self-pay | Admitting: Family Medicine

## 2017-04-05 DIAGNOSIS — H04123 Dry eye syndrome of bilateral lacrimal glands: Secondary | ICD-10-CM | POA: Diagnosis not present

## 2017-04-05 DIAGNOSIS — H2511 Age-related nuclear cataract, right eye: Secondary | ICD-10-CM | POA: Diagnosis not present

## 2017-04-15 ENCOUNTER — Other Ambulatory Visit: Payer: Self-pay | Admitting: Family Medicine

## 2017-05-11 ENCOUNTER — Telehealth: Payer: Self-pay | Admitting: *Deleted

## 2017-05-11 DIAGNOSIS — C61 Malignant neoplasm of prostate: Secondary | ICD-10-CM

## 2017-05-11 NOTE — Telephone Encounter (Signed)
Copied from [place] [id number]. Topic: Inquiry >> May 11, 2017  2:53 PM Oliver Pila B wrote: Reason for CRM: pt called and is wanting a PSA blood test lab created, call pt to advise and to schedule

## 2017-05-12 ENCOUNTER — Other Ambulatory Visit (INDEPENDENT_AMBULATORY_CARE_PROVIDER_SITE_OTHER): Payer: Medicare PPO

## 2017-05-12 DIAGNOSIS — C61 Malignant neoplasm of prostate: Secondary | ICD-10-CM

## 2017-05-12 LAB — PSA: PSA: 5.06 ng/mL — AB (ref 0.10–4.00)

## 2017-05-12 NOTE — Telephone Encounter (Signed)
Patient has appointment in June for CPE.  He just wanted his PSA checked.

## 2017-05-13 ENCOUNTER — Other Ambulatory Visit: Payer: Self-pay | Admitting: Family Medicine

## 2017-05-24 DIAGNOSIS — H04123 Dry eye syndrome of bilateral lacrimal glands: Secondary | ICD-10-CM | POA: Diagnosis not present

## 2017-05-24 DIAGNOSIS — H2511 Age-related nuclear cataract, right eye: Secondary | ICD-10-CM | POA: Diagnosis not present

## 2017-05-24 DIAGNOSIS — H2513 Age-related nuclear cataract, bilateral: Secondary | ICD-10-CM | POA: Diagnosis not present

## 2017-06-10 ENCOUNTER — Encounter: Payer: Self-pay | Admitting: Family Medicine

## 2017-06-10 ENCOUNTER — Ambulatory Visit (INDEPENDENT_AMBULATORY_CARE_PROVIDER_SITE_OTHER): Payer: Medicare PPO | Admitting: Family Medicine

## 2017-06-10 ENCOUNTER — Encounter

## 2017-06-10 VITALS — BP 102/70 | HR 75 | Temp 97.7°F | Resp 16 | Ht 71.5 in | Wt 185.8 lb

## 2017-06-10 DIAGNOSIS — C61 Malignant neoplasm of prostate: Secondary | ICD-10-CM

## 2017-06-10 DIAGNOSIS — Z23 Encounter for immunization: Secondary | ICD-10-CM

## 2017-06-10 DIAGNOSIS — E785 Hyperlipidemia, unspecified: Secondary | ICD-10-CM

## 2017-06-10 DIAGNOSIS — Z0000 Encounter for general adult medical examination without abnormal findings: Secondary | ICD-10-CM

## 2017-06-10 DIAGNOSIS — R7309 Other abnormal glucose: Secondary | ICD-10-CM

## 2017-06-10 DIAGNOSIS — R29890 Loss of height: Secondary | ICD-10-CM | POA: Diagnosis not present

## 2017-06-10 DIAGNOSIS — R739 Hyperglycemia, unspecified: Secondary | ICD-10-CM | POA: Diagnosis not present

## 2017-06-10 DIAGNOSIS — I10 Essential (primary) hypertension: Secondary | ICD-10-CM

## 2017-06-10 NOTE — Assessment & Plan Note (Signed)
Encouraged heart healthy diet, increase exercise, avoid trans fats, consider a krill oil cap daily 

## 2017-06-10 NOTE — Patient Instructions (Signed)
Preventive Care 74 Years and Older, Male Preventive care refers to lifestyle choices and visits with your health care provider that can promote health and wellness. What does preventive care include?  A yearly physical exam. This is also called an annual well check.  Dental exams once or twice a year.  Routine eye exams. Ask your health care provider how often you should have your eyes checked.  Personal lifestyle choices, including: ? Daily care of your teeth and gums. ? Regular physical activity. ? Eating a healthy diet. ? Avoiding tobacco and drug use. ? Limiting alcohol use. ? Practicing safe sex. ? Taking low doses of aspirin every day. ? Taking vitamin and mineral supplements as recommended by your health care provider. What happens during an annual well check? The services and screenings done by your health care provider during your annual well check will depend on your age, overall health, lifestyle risk factors, and family history of disease. Counseling Your health care provider may ask you questions about your:  Alcohol use.  Tobacco use.  Drug use.  Emotional well-being.  Home and relationship well-being.  Sexual activity.  Eating habits.  History of falls.  Memory and ability to understand (cognition).  Work and work environment.  Screening You may have the following tests or measurements:  Height, weight, and BMI.  Blood pressure.  Lipid and cholesterol levels. These may be checked every 5 years, or more frequently if you are over 50 years old.  Skin check.  Lung cancer screening. You may have this screening every year starting at age 55 if you have a 30-pack-year history of smoking and currently smoke or have quit within the past 15 years.  Fecal occult blood test (FOBT) of the stool. You may have this test every year starting at age 50.  Flexible sigmoidoscopy or colonoscopy. You may have a sigmoidoscopy every 5 years or a colonoscopy every 10  years starting at age 50.  Prostate cancer screening. Recommendations will vary depending on your family history and other risks.  Hepatitis C blood test.  Hepatitis B blood test.  Sexually transmitted disease (STD) testing.  Diabetes screening. This is done by checking your blood sugar (glucose) after you have not eaten for a while (fasting). You may have this done every 1-3 years.  Abdominal aortic aneurysm (AAA) screening. You may need this if you are a current or former smoker.  Osteoporosis. You may be screened starting at age 70 if you are at high risk.  Talk with your health care provider about your test results, treatment options, and if necessary, the need for more tests. Vaccines Your health care provider may recommend certain vaccines, such as:  Influenza vaccine. This is recommended every year.  Tetanus, diphtheria, and acellular pertussis (Tdap, Td) vaccine. You may need a Td booster every 10 years.  Varicella vaccine. You may need this if you have not been vaccinated.  Zoster vaccine. You may need this after age 60.  Measles, mumps, and rubella (MMR) vaccine. You may need at least one dose of MMR if you were born in 1957 or later. You may also need a second dose.  Pneumococcal 13-valent conjugate (PCV13) vaccine. One dose is recommended after age 74.  Pneumococcal polysaccharide (PPSV23) vaccine. One dose is recommended after age 74.  Meningococcal vaccine. You may need this if you have certain conditions.  Hepatitis A vaccine. You may need this if you have certain conditions or if you travel or work in places where you   may be exposed to hepatitis A.  Hepatitis B vaccine. You may need this if you have certain conditions or if you travel or work in places where you may be exposed to hepatitis B.  Haemophilus influenzae type b (Hib) vaccine. You may need this if you have certain risk factors.  Talk to your health care provider about which screenings and vaccines  you need and how often you need them. This information is not intended to replace advice given to you by your health care provider. Make sure you discuss any questions you have with your health care provider. Document Released: 01/18/2015 Document Revised: 09/11/2015 Document Reviewed: 10/23/2014 Elsevier Interactive Patient Education  2018 Elsevier Inc.  

## 2017-06-10 NOTE — Assessment & Plan Note (Signed)
Per u rology 

## 2017-06-10 NOTE — Progress Notes (Signed)
Patient ID: Antonio Stephens, male    DOB: 1945-12-03  Age: 72 y.o. MRN: [id number]    Subjective:  Subjective  HPI Antonio Stephens presents for cpe.  Pt was dx with prostate CA and is deciding between surgery and radiation.   No other complaints.    Review of Systems  Constitutional: Negative for chills, fatigue, fever and unexpected weight change.  HENT: Negative for congestion and hearing loss.   Eyes: Negative for discharge.  Respiratory: Negative for cough and shortness of breath.   Cardiovascular: Negative for chest pain, palpitations and leg swelling.  Gastrointestinal: Negative for abdominal pain, blood in stool, constipation, diarrhea, nausea and vomiting.  Genitourinary: Negative for dysuria, frequency, hematuria and urgency.  Musculoskeletal: Negative for back pain and myalgias.  Skin: Negative for rash.  Allergic/Immunologic: Negative for environmental allergies.  Neurological: Negative for dizziness, weakness and headaches.  Hematological: Does not bruise/bleed easily.  Psychiatric/Behavioral: Negative for suicidal ideas. The patient is not nervous/anxious.     History Past Medical History:  Diagnosis Date  . Back pain   . 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 Depression   . Glaucoma    bilateral  . Hyperlipidemia   . Hypertension   . Urinary retention     He has a past surgical history that includes Knee arthroscopy (04/2005); inguinal Herniorrhaphy; 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; Total hip arthroplasty (12/16/09); Knee surgery (09-2009); Colonoscopy; Partial knee arthroplasty (Left, 10.10.16); Hip resurfacing (Right, 11/04/2015); and Prostate biopsy (02/2016).   His family history includes Alzheimer's disease (age of onset: 66) in his mother; Breast cancer in his sister; Cancer (age of onset: 77) in his sister; Cancer (age of onset: 39) in his father; Hyperlipidemia in his paternal  grandfather.He reports that he quit smoking about 49 years ago. His smoking use included cigarettes. He quit after 0.20 years of use. He has never used smokeless tobacco. He reports that he drinks about 4.2 - 6.0 oz of alcohol per week. He reports that he does not use drugs.  Current Outpatient Medications on File Prior to Visit  Medication Sig Dispense Refill  . aspirin 81 MG tablet Take 81 mg by mouth daily.    . Calcium Carbonate-Vit D-Min (CALCIUM 1200 PO) Take 4 tablets by mouth daily. Raw calcium    . Cholecalciferol (VITAMIN D3) 2000 units TABS Take 1 capsule by mouth daily.    Marland Kitchen GLUCOSAMINE-CHONDROITIN-MSM PO Take 3,200 mg by mouth 2 (two) times daily.    Marland Kitchen lisinopril (PRINIVIL,ZESTRIL) 20 MG tablet TAKE 1 TABLET BY MOUTH EVERY DAY 30 tablet 0  . multivitamin (THERAGRAN) per tablet Take 1 tablet by mouth daily.      . Omega 3 1200 MG CAPS Take 1 capsule by mouth 2 (two) times daily.    . rosuvastatin (CRESTOR) 20 MG tablet TAKE 1 TABLET BY MOUTH DAILY 30 tablet 0  . sertraline (ZOLOFT) 100 MG tablet Take 100 mg by mouth daily.      . timolol (TIMOPTIC) 0.5 % ophthalmic solution Place 1 drop into the left eye 2 (two) times daily.     . TURMERIC PO Take 940 mg by mouth 2 (two) times daily.     No current facility-administered medications on file prior to visit.      Objective:  Objective  Physical Exam  Constitutional: He is oriented to person, place, and time. He appears well-developed and well-nourished. No distress.  HENT:  Head: Normocephalic and atraumatic.  Right Ear: External ear normal.  Left Ear: External ear normal.  Nose: Nose normal.  Mouth/Throat: Oropharynx is clear and moist. No oropharyngeal exudate.  Eyes: Pupils are equal, round, and reactive to light. Conjunctivae and EOM are normal. Right eye exhibits no discharge. Left eye exhibits no discharge.  Neck: Normal range of motion. Neck supple. No JVD present. No thyromegaly present.  Cardiovascular: Normal rate,  regular rhythm and intact distal pulses.  No murmur heard. Pulmonary/Chest: Effort normal and breath sounds normal. No respiratory distress. He has no wheezes. He has no rales. He exhibits no tenderness.  Abdominal: Soft. Bowel sounds are normal. He exhibits no distension and no mass. There is no tenderness. There is no rebound and no guarding.  Musculoskeletal: Normal range of motion. He exhibits no edema or tenderness.  Lymphadenopathy:    He has no cervical adenopathy.  Neurological: He is alert and oriented to person, place, and time. He has normal reflexes. He displays normal reflexes. No cranial nerve deficit. He exhibits normal muscle tone.  Skin: Skin is warm and dry. No rash noted. He is not diaphoretic. No erythema.  Psychiatric: He has a normal mood and affect. His behavior is normal. Judgment and thought content normal.  Nursing note and vitals reviewed.  BP 102/70 (BP Location: Right Arm, Cuff Size: Normal)   Pulse 75   Temp 97.7 F (36.5 C) (Oral)   Resp 16   Ht 5' 11.5" (1.816 m)   Wt 185 lb 12.8 oz (84.3 kg)   SpO2 97%   BMI 25.55 kg/m  Wt Readings from Last 3 Encounters:  06/10/17 185 lb 12.8 oz (84.3 kg)  04/02/16 195 lb 9.6 oz (88.7 kg)  11/29/15 185 lb (83.9 kg)     Lab Results  Component Value Date   WBC 8.0 04/02/2016   HGB 14.7 04/02/2016   HCT 43.8 04/02/2016   PLT 191.0 04/02/2016   GLUCOSE 111 (H) 10/02/2016   CHOL 149 10/02/2016   TRIG 101.0 10/02/2016   HDL 54.60 10/02/2016   LDLDIRECT 117.0 07/27/2006   LDLCALC 74 10/02/2016   ALT 16 10/02/2016   AST 18 10/02/2016   NA 137 10/02/2016   K 4.0 10/02/2016   CL 102 10/02/2016   CREATININE 1.03 10/02/2016   BUN 21 10/02/2016   CO2 30 10/02/2016   TSH 1.83 11/05/2011   PSA 5.06 (H) 05/12/2017   INR 0.91 07/29/2009   HGBA1C 5.7 11/14/2015   MICROALBUR 1.2 12/10/2009    No results found.   Assessment & Plan:  Plan  I have discontinued [facility]'s Glucosamine-Chondroit-Vit C-Mn  (GLUCOSAMINE CHONDR 1500 COMPLX PO), Turmeric, alendronate, and tamsulosin. I am also having him maintain his multivitamin, sertraline, timolol, aspirin, Calcium Carbonate-Vit D-Min (CALCIUM 1200 PO), lisinopril, rosuvastatin, Vitamin D3, GLUCOSAMINE-CHONDROITIN-MSM PO, Omega 3, and TURMERIC PO.  No orders of the defined types were placed in this encounter.   Problem List Items Addressed This Visit      Unprioritized   Essential hypertension - Primary    Well controlled, no changes to meds. Encouraged heart healthy diet such as the DASH diet and exercise as tolerated.       Relevant Orders   CBC with Differential/Platelet   Comprehensive metabolic panel   HYPERGLYCEMIA, FASTING    Check labs       Hyperlipidemia    Encouraged heart healthy diet, increase exercise, avoid trans fats, consider a krill oil cap daily  Preventative health care    ghm utd Check labs See AVS      Prostate cancer [facility])    Per urology       Other Visit Diagnoses    Hyperlipidemia LDL goal <100       Relevant Orders   Comprehensive metabolic panel   Lipid panel   Hyperglycemia       Relevant Orders   Hemoglobin A1c      Follow-up: Return in about 6 months (around 12/10/2017), or if symptoms worsen or fail to improve, for hypertension, hyperlipidemia.  Ann Held, DO

## 2017-06-10 NOTE — Assessment & Plan Note (Signed)
Check labs 

## 2017-06-10 NOTE — Assessment & Plan Note (Signed)
ghm utd Check labs See AVS 

## 2017-06-10 NOTE — Assessment & Plan Note (Signed)
Well controlled, no changes to meds. Encouraged heart healthy diet such as the DASH diet and exercise as tolerated.  °

## 2017-06-11 LAB — COMPREHENSIVE METABOLIC PANEL
ALBUMIN: 4.2 g/dL (ref 3.5–5.2)
ALK PHOS: 51 U/L (ref 39–117)
ALT: 17 U/L (ref 0–53)
AST: 23 U/L (ref 0–37)
BUN: 23 mg/dL (ref 6–23)
CALCIUM: 9.3 mg/dL (ref 8.4–10.5)
CO2: 27 meq/L (ref 19–32)
CREATININE: 0.93 mg/dL (ref 0.40–1.50)
Chloride: 102 mEq/L (ref 96–112)
GFR: 84.51 mL/min (ref >60.00)
Glucose, Bld: 84 mg/dL (ref 70–99)
Potassium: 4.3 mEq/L (ref 3.5–5.1)
Sodium: 140 mEq/L (ref 135–145)
TOTAL PROTEIN: 6.3 g/dL (ref 6.0–8.3)
Total Bilirubin: 1 mg/dL (ref 0.2–1.2)

## 2017-06-11 LAB — CBC WITH DIFFERENTIAL/PLATELET
BASOS PCT: 0.3 % (ref 0.0–3.0)
Basophils Absolute: 0 10*3/uL (ref 0.0–0.1)
Eosinophils Absolute: 0.2 10*3/uL (ref 0.0–0.7)
Eosinophils Relative: 2.2 % (ref 0.0–5.0)
HEMATOCRIT: 42.8 % (ref 39.0–52.0)
HEMOGLOBIN: 14.4 g/dL (ref 13.0–17.0)
LYMPHS PCT: 34.5 % (ref 12.0–46.0)
Lymphs Abs: 3.2 10*3/uL (ref 0.7–4.0)
MCHC: 33.7 g/dL (ref 30.0–36.0)
MCV: 94.1 fl (ref 78.0–100.0)
MONOS PCT: 8.4 % (ref 3.0–12.0)
Monocytes Absolute: 0.8 10*3/uL (ref 0.1–1.0)
NEUTROS ABS: 5 10*3/uL (ref 1.4–7.7)
Neutrophils Relative %: 54.6 % (ref 43.0–77.0)
PLATELETS: 218 10*3/uL (ref 150.0–400.0)
RBC: 4.55 Mil/uL (ref 4.22–5.81)
RDW: 14 % (ref 11.5–15.5)
WBC: 9.2 10*3/uL (ref 4.0–10.5)

## 2017-06-11 LAB — HEMOGLOBIN A1C: Hgb A1c MFr Bld: 5.7 % (ref 4.6–6.5)

## 2017-06-11 LAB — LIPID PANEL
CHOLESTEROL: 141 mg/dL (ref 0–200)
HDL: 43 mg/dL (ref >39.00)
LDL Cholesterol: 75 mg/dL (ref 0–99)
NonHDL: 98.19
TRIGLYCERIDES: 116 mg/dL (ref 0.0–149.0)
Total CHOL/HDL Ratio: 3
VLDL: 23.2 mg/dL (ref 0.0–40.0)

## 2017-06-11 NOTE — Addendum Note (Signed)
Addended by: Roma Schanz R on: 06/11/2017 12:05 PM   Modules accepted: Orders

## 2017-06-14 ENCOUNTER — Telehealth: Payer: Self-pay

## 2017-06-14 DIAGNOSIS — R29890 Loss of height: Secondary | ICD-10-CM

## 2017-06-14 NOTE — Telephone Encounter (Signed)
-----   Message from Ann Held, DO sent at 06/11/2017 12:05 PM EDT ----- Pt discussed his height loss with me yesterday but I forgot to discuss bone density with him--- I would like to get a bone density since he has shrunk and inch

## 2017-06-14 NOTE — Telephone Encounter (Signed)
Author phoned pt. per Dr. Nonda Lou request to schedule a DEXA scan d/t Dr's concern regarding height loss. Chief Strategy Officer spoke with Gwyndolyn Saxon, wife, who stated she would discuss with her husband when he was home, and would call back when they have decided how to proceed. Order left pended, and routed to Dr. Etter Sjogren for notification.

## 2017-06-15 DIAGNOSIS — H25811 Combined forms of age-related cataract, right eye: Secondary | ICD-10-CM | POA: Diagnosis not present

## 2017-06-15 DIAGNOSIS — H2511 Age-related nuclear cataract, right eye: Secondary | ICD-10-CM | POA: Diagnosis not present

## 2017-06-15 NOTE — Telephone Encounter (Signed)
DEXA scan already ordered by Dr. Etter Sjogren on 6/7.

## 2017-06-15 NOTE — Telephone Encounter (Addendum)
Pt wife is calling back and her husband would like to proceed with DEXA scan.

## 2017-06-16 NOTE — Addendum Note (Signed)
Addended by: Bartholome Bill on: 06/16/2017 12:00 PM   Modules accepted: Orders

## 2017-06-16 NOTE — Addendum Note (Signed)
Addended by: Bartholome Bill on: 06/16/2017 11:56 AM   Modules accepted: Orders

## 2017-06-22 DIAGNOSIS — H25812 Combined forms of age-related cataract, left eye: Secondary | ICD-10-CM | POA: Diagnosis not present

## 2017-06-22 DIAGNOSIS — H2512 Age-related nuclear cataract, left eye: Secondary | ICD-10-CM | POA: Diagnosis not present

## 2017-06-25 ENCOUNTER — Other Ambulatory Visit: Payer: Self-pay | Admitting: Family Medicine

## 2017-07-09 ENCOUNTER — Encounter: Payer: Self-pay | Admitting: *Deleted

## 2017-07-25 ENCOUNTER — Other Ambulatory Visit: Payer: Self-pay | Admitting: Family Medicine

## 2017-09-07 DIAGNOSIS — C61 Malignant neoplasm of prostate: Secondary | ICD-10-CM | POA: Diagnosis not present

## 2017-09-07 DIAGNOSIS — N138 Other obstructive and reflux uropathy: Secondary | ICD-10-CM | POA: Diagnosis not present

## 2017-09-07 DIAGNOSIS — N401 Enlarged prostate with lower urinary tract symptoms: Secondary | ICD-10-CM | POA: Diagnosis not present

## 2017-11-12 ENCOUNTER — Telehealth: Payer: Self-pay | Admitting: *Deleted

## 2017-11-12 DIAGNOSIS — C61 Malignant neoplasm of prostate: Secondary | ICD-10-CM

## 2017-11-12 NOTE — Telephone Encounter (Signed)
Copied from [place] [phone]. Topic: Appointment Scheduling - Scheduling Inquiry for Clinic >> Nov 12, 2017  1:16 PM Lennox Solders wrote: Reason for CRM:pt would like to come in and have another PSA drawn

## 2017-11-15 NOTE — Telephone Encounter (Signed)
Patient had one done last in may and I believe he see urology.  Are you ok with ordering this for him.

## 2017-11-15 NOTE — Telephone Encounter (Signed)
That is fine 

## 2017-11-15 NOTE — Telephone Encounter (Signed)
Notified pt and scheduled lab appt for 11/17/17 at 9:25. Future order entered.

## 2017-11-17 ENCOUNTER — Other Ambulatory Visit (INDEPENDENT_AMBULATORY_CARE_PROVIDER_SITE_OTHER): Payer: Medicare PPO

## 2017-11-17 DIAGNOSIS — C61 Malignant neoplasm of prostate: Secondary | ICD-10-CM

## 2017-11-17 LAB — PSA: PSA: 2.28 ng/mL (ref 0.10–4.00)

## 2017-11-23 DIAGNOSIS — C61 Malignant neoplasm of prostate: Secondary | ICD-10-CM | POA: Diagnosis not present

## 2017-12-06 DIAGNOSIS — H26492 Other secondary cataract, left eye: Secondary | ICD-10-CM | POA: Diagnosis not present

## 2017-12-06 DIAGNOSIS — H26491 Other secondary cataract, right eye: Secondary | ICD-10-CM | POA: Diagnosis not present

## 2017-12-07 ENCOUNTER — Ambulatory Visit: Payer: Medicare PPO | Admitting: Family Medicine

## 2017-12-07 ENCOUNTER — Encounter: Payer: Self-pay | Admitting: Family Medicine

## 2017-12-07 VITALS — BP 128/76 | HR 88 | Temp 99.0°F | Ht 71.5 in | Wt 195.5 lb

## 2017-12-07 DIAGNOSIS — J40 Bronchitis, not specified as acute or chronic: Secondary | ICD-10-CM | POA: Insufficient documentation

## 2017-12-07 MED ORDER — AZITHROMYCIN 250 MG PO TABS: ORAL_TABLET | ORAL | 0 refills | Status: DC

## 2017-12-07 MED ORDER — DM-GUAIFENESIN ER 30-600 MG PO TB12: 1.0000 | ORAL_TABLET | Freq: Two times a day (BID) | ORAL | 0 refills | Status: DC

## 2017-12-07 NOTE — Progress Notes (Signed)
Established Patient Office Visit  Subjective:  Patient ID: Antonio Stephens, male    DOB: May 17, 1945  Age: 72 y.o. MRN: [id number]  CC:  Chief Complaint  Patient presents with  . Cough  . head congestion    HPI Antonio Stephens presents for evaluation of a deep cough and head congestion that he has had for a week. It became worse over the weekend. No fever, his wife is having the same symptoms. He has been taking Mucinex at night time to help him sleep. He has also been taking Advil. His wife went to be seen at an urgent care  yesterday.  Presents for treatment and evaluation of a one-week history of URI that started in the chest and is moved up into his head area.  Patient is no longer having any nasal congestion postnasal drip but cough has lingered.  Cough is been minimally productive but he is now running an elevated temperature.  He denies wheezing or reactive airway disease.  Quit smoking many years ago and has no history of COPD.  He has been taking Mucinex. Past Medical History:  Diagnosis Date  . Back pain   . 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 Depression   . Glaucoma    bilateral  . Hyperlipidemia   . Hypertension   . Urinary retention     Past Surgical History:  Procedure Laterality Date  . COLONOSCOPY    . HIP RESURFACING Right 11/04/2015  . inguinal Herniorrhaphy     left  . KNEE ARTHROSCOPY  04/2005   right  . KNEE SURGERY  09-2009   partial replacemente , R  . PARTIAL KNEE ARTHROPLASTY Left 10.10.16  . PROSTATE BIOPSY  02/2016  . TOTAL HIP ARTHROPLASTY  12/16/09   left hip  . 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     benign/fatty tumor    Family History  Problem Relation Age of Onset  . Alzheimer's disease Mother 43  . Cancer Father 2       sm cell carcinoma lung  . Cancer Sister 36       breast  . Breast cancer Sister   . Hyperlipidemia Paternal Grandfather   . Colon  cancer Neg Hx   . Stomach cancer Neg Hx     Social History   Socioeconomic History  . Marital status: Married    Spouse name: Not on file  . Number of children: Not on file  . Years of education: Not on file  . Highest education level: Not on file  Occupational History  . Occupation: Teacher, early years/pre  Social Needs  . Financial resource strain: Not on file  . Food insecurity:    Worry: Not on file    Inability: Not on file  . Transportation needs:    Medical: Not on file    Non-medical: Not on file  Tobacco Use  . Smoking status: Former Smoker    Years: 0.20    Types: Cigarettes    Last attempt to quit: 09/15/1967    Years since quitting: 50.2  . Smokeless tobacco: Never Used  Substance and Sexual Activity  . Alcohol use: Yes    Alcohol/week: 7.0 - 10.0 standard drinks    Types: 7 - 10 Glasses of wine per week    Comment: sometimes nightly  . Drug use: No  . Sexual activity: Yes  Lifestyle  . Physical activity:  Days per week: Not on file    Minutes per session: Not on file  . Stress: Not on file  Relationships  . Social connections:    Talks on phone: Not on file    Gets together: Not on file    Attends religious service: Not on file    Active member of club or organization: Not on file    Attends meetings of clubs or organizations: Not on file    Relationship status: Not on file  . Intimate partner violence:    Fear of current or ex partner: Not on file    Emotionally abused: Not on file    Physically abused: Not on file    Forced sexual activity: Not on file  Other Topics Concern  . Not on file  Social History Narrative  . Not on file    Outpatient Medications Prior to Visit  Medication Sig Dispense Refill  . aspirin 81 MG tablet Take 81 mg by mouth daily.    . Calcium Carbonate-Vit D-Min (CALCIUM 1200 PO) Take 4 tablets by mouth daily. Raw calcium    . Cholecalciferol (VITAMIN D3) 2000 units TABS Take 1 capsule by mouth daily.    Marland Kitchen  GLUCOSAMINE-CHONDROITIN-MSM PO Take 3,200 mg by mouth 2 (two) times daily.    Marland Kitchen lisinopril (PRINIVIL,ZESTRIL) 20 MG tablet Take 1 tablet (20 mg total) by mouth daily. 90 tablet 1  . multivitamin (THERAGRAN) per tablet Take 1 tablet by mouth daily.      . Omega 3 1200 MG CAPS Take 1 capsule by mouth 2 (two) times daily.    . rosuvastatin (CRESTOR) 20 MG tablet Take 1 tablet (20 mg total) by mouth daily. 90 tablet 1  . sertraline (ZOLOFT) 100 MG tablet Take 100 mg by mouth daily.      . timolol (TIMOPTIC) 0.5 % ophthalmic solution Place 1 drop into the left eye 2 (two) times daily.     . TURMERIC PO Take 940 mg by mouth 2 (two) times daily.     No facility-administered medications prior to visit.     No Known Allergies  ROS Review of Systems  Constitutional: Negative for chills, diaphoresis, fatigue, fever and unexpected weight change.  HENT: Negative for congestion, postnasal drip, rhinorrhea, sinus pressure and sinus pain.   Eyes: Negative for photophobia and visual disturbance.  Respiratory: Positive for cough. Negative for shortness of breath and wheezing.   Cardiovascular: Negative.   Gastrointestinal: Negative.   Endocrine: Negative for polyphagia and polyuria.  Musculoskeletal: Negative for arthralgias and myalgias.  Allergic/Immunologic: Negative for immunocompromised state.  Neurological: Negative for light-headedness and headaches.  Hematological: Does not bruise/bleed easily.  Psychiatric/Behavioral: Negative.       Objective:    Physical Exam  Constitutional: He is oriented to person, place, and time. He appears well-developed and well-nourished. No distress.  HENT:  Head: Normocephalic and atraumatic.  Right Ear: External ear normal.  Left Ear: External ear normal.  Mouth/Throat: Oropharynx is clear and moist. No oropharyngeal exudate.  Eyes: Pupils are equal, round, and reactive to light. Conjunctivae are normal. Right eye exhibits no discharge. Left eye exhibits  no discharge. No scleral icterus.  Neck: Neck supple. No JVD present. No tracheal deviation present. No thyromegaly present.  Cardiovascular: Normal rate, regular rhythm and normal heart sounds.  Pulmonary/Chest: Effort normal and breath sounds normal. No stridor. No respiratory distress. He has no wheezes. He has no rales.  Abdominal: Bowel sounds are normal.  Lymphadenopathy:  He has no cervical adenopathy.  Neurological: He is alert and oriented to person, place, and time.  Skin: Skin is warm and dry. No rash noted. He is not diaphoretic. No erythema.  Psychiatric: He has a normal mood and affect. His behavior is normal.    BP 128/76   Pulse 88   Temp 99 F (37.2 C) (Oral)   Ht 5' 11.5" (1.816 m)   Wt 195 lb 8 oz (88.7 kg)   SpO2 96%   BMI 26.89 kg/m  Wt Readings from Last 3 Encounters:  12/07/17 195 lb 8 oz (88.7 kg)  06/10/17 185 lb 12.8 oz (84.3 kg)  04/02/16 195 lb 9.6 oz (88.7 kg)   BP Readings from Last 3 Encounters:  12/07/17 128/76  06/10/17 102/70  04/02/16 102/78   Health Maintenance Due  Topic Date Due  . FOOT EXAM  11/10/1953  . OPHTHALMOLOGY EXAM  11/10/1953  . INFLUENZA VACCINE  08/05/2017    There are no preventive care reminders to display for this patient.  Lab Results  Component Value Date   TSH 1.83 11/05/2011   Lab Results  Component Value Date   WBC 9.2 06/10/2017   HGB 14.4 06/10/2017   HCT 42.8 06/10/2017   MCV 94.1 06/10/2017   PLT 218.0 06/10/2017   Lab Results  Component Value Date   NA 140 06/10/2017   K 4.3 06/10/2017   CO2 27 06/10/2017   GLUCOSE 84 06/10/2017   BUN 23 06/10/2017   CREATININE 0.93 06/10/2017   BILITOT 1.0 06/10/2017   ALKPHOS 51 06/10/2017   AST 23 06/10/2017   ALT 17 06/10/2017   PROT 6.3 06/10/2017   ALBUMIN 4.2 06/10/2017   CALCIUM 9.3 06/10/2017   ANIONGAP 10 11/29/2015   GFR 84.51 06/10/2017   Lab Results  Component Value Date   CHOL 141 06/10/2017   Lab Results  Component Value Date    HDL 43.00 06/10/2017   Lab Results  Component Value Date   LDLCALC 75 06/10/2017   Lab Results  Component Value Date   TRIG 116.0 06/10/2017   Lab Results  Component Value Date   CHOLHDL 3 06/10/2017   Lab Results  Component Value Date   HGBA1C 5.7 06/10/2017      Assessment & Plan:   Problem List Items Addressed This Visit      Respiratory   Bronchitis - Primary   Relevant Medications   azithromycin (ZITHROMAX) 250 MG tablet   dextromethorphan-guaiFENesin (MUCINEX DM) 30-600 MG 12hr tablet      Meds ordered this encounter  Medications  . azithromycin (ZITHROMAX) 250 MG tablet    Sig: Take 2 today and then 1 each day until finished.Take 2 today and then 1 each day until finished.    Dispense:  6 tablet    Refill:  0  . dextromethorphan-guaiFENesin (MUCINEX DM) 30-600 MG 12hr tablet    Sig: Take 1 tablet by mouth 2 (two) times daily.    Dispense:  20 tablet    Refill:  0    Follow-up: Return in about 1 week (around 12/14/2017), or if symptoms worsen or fail to improve.

## 2017-12-07 NOTE — Patient Instructions (Signed)

## 2017-12-22 DIAGNOSIS — H26492 Other secondary cataract, left eye: Secondary | ICD-10-CM | POA: Diagnosis not present

## 2017-12-28 DIAGNOSIS — C61 Malignant neoplasm of prostate: Secondary | ICD-10-CM | POA: Diagnosis not present

## 2018-01-20 ENCOUNTER — Encounter: Payer: Self-pay | Admitting: Family Medicine

## 2018-01-20 ENCOUNTER — Ambulatory Visit: Payer: Medicare PPO | Admitting: Family Medicine

## 2018-01-20 VITALS — BP 108/68 | HR 80 | Temp 97.9°F | Ht 72.0 in | Wt 193.1 lb

## 2018-01-20 DIAGNOSIS — R0981 Nasal congestion: Secondary | ICD-10-CM

## 2018-01-20 MED ORDER — METHYLPREDNISOLONE ACETATE 80 MG/ML IJ SUSP
80.0000 mg | Freq: Once | INTRAMUSCULAR | Status: AC
  Administered 2018-01-20: 80 mg via INTRAMUSCULAR

## 2018-01-20 NOTE — Addendum Note (Signed)
Addended by: Sharon Seller B on: 01/20/2018 04:37 PM   Modules accepted: Orders

## 2018-01-20 NOTE — Patient Instructions (Addendum)
Use Afrin Friday through Sunday.  If you start having fevers, let us know or seek care.  Continue to wash your hands frequently and push fluids.  Let us know if you need anything.

## 2018-01-20 NOTE — Progress Notes (Signed)
Chief Complaint  Patient presents with  . Sinusitis    Antonio Stephens here for URI complaints.  Duration: 2 weeks  Associated symptoms: sinus congestion, fatigue and rhinorrhea Denies: sinus pain, itchy watery eyes, ear pain, ear drainage, sore throat, wheezing, shortness of breath, myalgia and cough, fevers Treatment to date: Emergen- C helped a Pospisil Sick contacts: No  ROS:  Const: Denies fevers HEENT: As noted in HPI Lungs: No SOB  Past Medical History:  Diagnosis Date  . Back pain   . 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 Depression   . Glaucoma    bilateral  . Hyperlipidemia   . Hypertension   . Urinary retention     BP 108/68 (BP Location: Left Arm, Patient Position: Sitting, Cuff Size: Normal)   Pulse 80   Temp 97.9 F (36.6 C) (Oral)   Ht 6' (1.829 m)   Wt 193 lb 2 oz (87.6 kg)   SpO2 96%   BMI 26.19 kg/m  General: Awake, alert, appears stated age HEENT: AT, [place], ears patent b/l and TM's neg, nares patent w/o discharge, pharynx pink and without exudates, MMM Neck: No masses or asymmetry Heart: RRR Lungs: CTAB, no accessory muscle use Psych: Age appropriate judgment and insight, normal mood and affect  Nasal congestion  Depo inj today to cover for allergies. Afrin Fr-Sun given his travel coming up. Continue to push fluids, practice good hand hygiene. F/u prn. If starting to experience fevers, shaking, or shortness of breath, seek immediate care. Pt voiced understanding and agreement to the plan.  [place], DO 01/20/18 4:24 PM

## 2018-01-20 NOTE — Progress Notes (Signed)
Pre visit review using our clinic review tool, if applicable. No additional management support is needed unless otherwise documented below in the visit note. 

## 2018-01-30 ENCOUNTER — Other Ambulatory Visit: Payer: Self-pay | Admitting: Family Medicine

## 2018-02-14 DIAGNOSIS — Z79899 Other long term (current) drug therapy: Secondary | ICD-10-CM | POA: Diagnosis not present

## 2018-02-14 DIAGNOSIS — C61 Malignant neoplasm of prostate: Secondary | ICD-10-CM | POA: Diagnosis not present

## 2018-02-14 DIAGNOSIS — Z5181 Encounter for therapeutic drug level monitoring: Secondary | ICD-10-CM | POA: Diagnosis not present

## 2018-03-03 DIAGNOSIS — C61 Malignant neoplasm of prostate: Secondary | ICD-10-CM | POA: Diagnosis not present

## 2018-03-07 DIAGNOSIS — C61 Malignant neoplasm of prostate: Secondary | ICD-10-CM | POA: Diagnosis not present

## 2018-03-18 DIAGNOSIS — C61 Malignant neoplasm of prostate: Secondary | ICD-10-CM | POA: Diagnosis not present

## 2018-03-21 DIAGNOSIS — C61 Malignant neoplasm of prostate: Secondary | ICD-10-CM | POA: Diagnosis not present

## 2018-03-22 DIAGNOSIS — C61 Malignant neoplasm of prostate: Secondary | ICD-10-CM | POA: Diagnosis not present

## 2018-03-23 DIAGNOSIS — C61 Malignant neoplasm of prostate: Secondary | ICD-10-CM | POA: Diagnosis not present

## 2018-03-24 DIAGNOSIS — C61 Malignant neoplasm of prostate: Secondary | ICD-10-CM | POA: Diagnosis not present

## 2018-03-25 DIAGNOSIS — C61 Malignant neoplasm of prostate: Secondary | ICD-10-CM | POA: Diagnosis not present

## 2018-03-28 DIAGNOSIS — C61 Malignant neoplasm of prostate: Secondary | ICD-10-CM | POA: Diagnosis not present

## 2018-03-29 DIAGNOSIS — C61 Malignant neoplasm of prostate: Secondary | ICD-10-CM | POA: Diagnosis not present

## 2018-03-30 DIAGNOSIS — C61 Malignant neoplasm of prostate: Secondary | ICD-10-CM | POA: Diagnosis not present

## 2018-03-31 DIAGNOSIS — C61 Malignant neoplasm of prostate: Secondary | ICD-10-CM | POA: Diagnosis not present

## 2018-04-01 DIAGNOSIS — C61 Malignant neoplasm of prostate: Secondary | ICD-10-CM | POA: Diagnosis not present

## 2018-04-04 DIAGNOSIS — C61 Malignant neoplasm of prostate: Secondary | ICD-10-CM | POA: Diagnosis not present

## 2018-04-05 DIAGNOSIS — C61 Malignant neoplasm of prostate: Secondary | ICD-10-CM | POA: Diagnosis not present

## 2018-04-06 DIAGNOSIS — C61 Malignant neoplasm of prostate: Secondary | ICD-10-CM | POA: Diagnosis not present

## 2018-04-07 DIAGNOSIS — C61 Malignant neoplasm of prostate: Secondary | ICD-10-CM | POA: Diagnosis not present

## 2018-04-08 DIAGNOSIS — C61 Malignant neoplasm of prostate: Secondary | ICD-10-CM | POA: Diagnosis not present

## 2018-04-11 DIAGNOSIS — C61 Malignant neoplasm of prostate: Secondary | ICD-10-CM | POA: Diagnosis not present

## 2018-04-12 DIAGNOSIS — C61 Malignant neoplasm of prostate: Secondary | ICD-10-CM | POA: Diagnosis not present

## 2018-04-13 DIAGNOSIS — C61 Malignant neoplasm of prostate: Secondary | ICD-10-CM | POA: Diagnosis not present

## 2018-04-14 DIAGNOSIS — C61 Malignant neoplasm of prostate: Secondary | ICD-10-CM | POA: Diagnosis not present

## 2018-04-18 DIAGNOSIS — C61 Malignant neoplasm of prostate: Secondary | ICD-10-CM | POA: Diagnosis not present

## 2018-04-19 DIAGNOSIS — C61 Malignant neoplasm of prostate: Secondary | ICD-10-CM | POA: Diagnosis not present

## 2018-04-20 DIAGNOSIS — C61 Malignant neoplasm of prostate: Secondary | ICD-10-CM | POA: Diagnosis not present

## 2018-04-21 DIAGNOSIS — C61 Malignant neoplasm of prostate: Secondary | ICD-10-CM | POA: Diagnosis not present

## 2018-04-22 DIAGNOSIS — C61 Malignant neoplasm of prostate: Secondary | ICD-10-CM | POA: Diagnosis not present

## 2018-04-25 DIAGNOSIS — C61 Malignant neoplasm of prostate: Secondary | ICD-10-CM | POA: Diagnosis not present

## 2018-04-26 DIAGNOSIS — C61 Malignant neoplasm of prostate: Secondary | ICD-10-CM | POA: Diagnosis not present

## 2018-04-27 DIAGNOSIS — C61 Malignant neoplasm of prostate: Secondary | ICD-10-CM | POA: Diagnosis not present

## 2018-04-28 DIAGNOSIS — C61 Malignant neoplasm of prostate: Secondary | ICD-10-CM | POA: Diagnosis not present

## 2018-04-29 ENCOUNTER — Other Ambulatory Visit: Payer: Self-pay | Admitting: Family Medicine

## 2018-05-30 DIAGNOSIS — Z961 Presence of intraocular lens: Secondary | ICD-10-CM | POA: Diagnosis not present

## 2018-05-30 DIAGNOSIS — H401230 Low-tension glaucoma, bilateral, stage unspecified: Secondary | ICD-10-CM | POA: Diagnosis not present

## 2018-06-02 ENCOUNTER — Other Ambulatory Visit: Payer: Self-pay | Admitting: Family Medicine

## 2018-06-06 ENCOUNTER — Other Ambulatory Visit: Payer: Self-pay | Admitting: Family Medicine

## 2018-06-21 ENCOUNTER — Encounter: Payer: Self-pay | Admitting: Internal Medicine

## 2018-07-06 ENCOUNTER — Other Ambulatory Visit: Payer: Self-pay | Admitting: Family Medicine

## 2018-08-02 DIAGNOSIS — C61 Malignant neoplasm of prostate: Secondary | ICD-10-CM | POA: Diagnosis not present

## 2018-09-18 DIAGNOSIS — T148XXA Other injury of unspecified body region, initial encounter: Secondary | ICD-10-CM | POA: Diagnosis not present

## 2018-09-23 ENCOUNTER — Ambulatory Visit (INDEPENDENT_AMBULATORY_CARE_PROVIDER_SITE_OTHER): Payer: Medicare PPO | Admitting: Family Medicine

## 2018-09-23 ENCOUNTER — Encounter: Payer: Self-pay | Admitting: Family Medicine

## 2018-09-23 ENCOUNTER — Other Ambulatory Visit: Payer: Self-pay

## 2018-09-23 VITALS — BP 110/78 | HR 80 | Temp 97.7°F | Resp 18 | Ht 72.0 in | Wt 193.2 lb

## 2018-09-23 DIAGNOSIS — T63301A Toxic effect of unspecified spider venom, accidental (unintentional), initial encounter: Secondary | ICD-10-CM | POA: Insufficient documentation

## 2018-09-23 DIAGNOSIS — Z23 Encounter for immunization: Secondary | ICD-10-CM

## 2018-09-23 DIAGNOSIS — S91302A Unspecified open wound, left foot, initial encounter: Secondary | ICD-10-CM | POA: Diagnosis not present

## 2018-09-23 NOTE — Patient Instructions (Signed)
Spider Bite Spider bites are not common. When spider bites do happen, most do not cause serious health problems. There are only a few types of spider bites that can cause serious health problems. What are the causes? This condition is caused when a person accidentally makes contact with a spider in a way that traps the spider against the person's skin. What are the signs or symptoms? Symptoms may vary depending on the type of spider. Some spider bites may cause symptoms within 1 hour after the bite. For other spider bites, it may take 1-2 days for symptoms to develop. Common symptoms of this condition include:  A raised area that is red.  Redness and swelling around the area of the bite or bites.  Discomfort or pain in the area of the bite. A few types of spiders, such as the black widow spider or the brown recluse spider, can inject poison (venom) into a bite wound. This venom causes more serious symptoms. Symptoms of a venomous spider bite vary, and may include:  Muscle cramps.  Nausea, vomiting, or abdominal pain.  A fever.  A skin sore (lesion) that spreads. This can break into an open wound (skin ulcer).  Light-headedness or dizziness. How is this diagnosed? This condition may be diagnosed based on your symptoms and a physical exam. Your health care provider will ask about the history of your injury and any details you may have about the spider. This may help to determine what type of spider bit you. How is this treated? Many spider bites do not require treatment. If needed, this condition may be treated by:  Icing and keeping the bite area raised (elevated).  Taking or applying over-the-counter or prescription medicines to help control symptoms such as pain and itching.  Having a tetanus shot, if needed.  Taking antibiotic medicine. Follow these instructions at home: Medicines  Take or apply over-the-counter and prescription medicines only as told by your health care  provider.  If you were prescribed an antibiotic medicine, take it as told by your health care provider. Do not stop using the antibiotic even if you start to feel better. Managing pain and swelling   If directed, put ice on the bite area. ? Put ice in a plastic bag. ? Place a towel between your skin and the bag. ? Leave the ice on for 20 minutes, 2-3 times a day.  Elevate the affected area above the level of your heart while you are sitting or lying down. General instructions   Do not scratch the bite area.  Keep the bite area clean and dry. Wash the bite area daily with soap and water as told by your health care provider.  Keep all follow-up visits as told by your health care provider. This is important. Contact a health care provider if:  Your bite does not get better after 3 days of treatment.  Your bite turns black or purple.  You have increased redness, swelling, or pain at the site of the bite. Get help right away if:  You develop shortness of breath or chest pain.  You have fluid, blood, or pus coming from the bite area.  You have muscle cramps or painful muscle spasms.  You develop abdominal pain, nausea, or vomiting.  You feel unusually tired (fatigued) or sleepy. Summary  Spider bites are not common. When spider bites do happen, most do not cause serious health problems.  Take or apply over-the-counter and prescription medicines only as told by your health   care provider.  Keep the bite area clean and dry. Wash the bite area daily with soap and water as told by your health care provider.  Contact a health care provider if you have increased redness, swelling, or pain at the site of the bite.  Get help right away if you have new or worsening symptoms or develop shortness of breath or chest pain. This information is not intended to replace advice given to you by your health care provider. Make sure you discuss any questions you have with your health care  provider. Document Released: 01/30/2004 Document Revised: 08/03/2017 Document Reviewed: 08/03/2017 Elsevier Patient Education  2020 Elsevier Inc.  

## 2018-09-23 NOTE — Assessment & Plan Note (Signed)
Finish doxy Call back Monday if not completely resolved

## 2018-09-23 NOTE — Progress Notes (Signed)
Patient ID: Antonio BRIDENBAUGH, male    DOB: November 03, 1945  Age: 73 y.o. MRN: [id number]    Subjective:  Subjective  HPI Antonio Stephens Meske presents for spider bite on L foot.  He went to urgent care and was put on doxy.  He has 3 days left --- per pt it is improving   Review of Systems  Constitutional: Negative for appetite change, diaphoresis, fatigue and unexpected weight change.  Eyes: Negative for pain, redness and visual disturbance.  Respiratory: Negative for cough, chest tightness, shortness of breath and wheezing.   Cardiovascular: Negative for chest pain, palpitations and leg swelling.  Endocrine: Negative for cold intolerance, heat intolerance, polydipsia, polyphagia and polyuria.  Genitourinary: Negative for difficulty urinating, dysuria and frequency.  Skin: Positive for wound.  Neurological: Negative for dizziness, light-headedness, numbness and headaches.    History Past Medical History:  Diagnosis Date  . Back pain   . 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 Depression   . Glaucoma    bilateral  . Hyperlipidemia   . Hypertension   . Urinary retention     He has a past surgical history that includes Knee arthroscopy (04/2005); inguinal Herniorrhaphy; 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; Total hip arthroplasty (12/16/09); Knee surgery (09-2009); Colonoscopy; Partial knee arthroplasty (Left, 10.10.16); Hip resurfacing (Right, 11/04/2015); and Prostate biopsy (02/2016).   His family history includes Alzheimer's disease (age of onset: 52) in his mother; Breast cancer in his sister; Cancer (age of onset: 69) in his sister; Cancer (age of onset: 61) in his father; Hyperlipidemia in his paternal grandfather.He reports that he quit smoking about 51 years ago. His smoking use included cigarettes. He quit after 0.20 years of use. He has never used smokeless tobacco. He reports current alcohol use of about 7.0 -  10.0 standard drinks of alcohol per week. He reports that he does not use drugs.  Current Outpatient Medications on File Prior to Visit  Medication Sig Dispense Refill  . aspirin 81 MG tablet Take 81 mg by mouth daily.    . Calcium Carbonate-Vit D-Min (CALCIUM 1200 PO) Take 4 tablets by mouth daily. Raw calcium    . Cholecalciferol (VITAMIN D3) 2000 units TABS Take 1 capsule by mouth daily.    Marland Kitchen doxycycline (VIBRA-TABS) 100 MG tablet TK 1 T PO BID    . GLUCOSAMINE-CHONDROITIN-MSM PO Take 3,200 mg by mouth 2 (two) times daily.    Marland Kitchen lisinopril (ZESTRIL) 20 MG tablet TAKE 1 TABLET(20 MG) BY MOUTH DAILY. FOLLOW UP VISIT 90 tablet 0  . multivitamin (THERAGRAN) per tablet Take 1 tablet by mouth daily.      . Omega 3 1200 MG CAPS Take 1 capsule by mouth 2 (two) times daily.    . rosuvastatin (CRESTOR) 20 MG tablet TAKE 1 TABLET(20 MG) BY MOUTH DAILY 30 tablet 2  . sertraline (ZOLOFT) 100 MG tablet Take 100 mg by mouth daily.      . timolol (TIMOPTIC) 0.5 % ophthalmic solution Place 1 drop into the left eye 2 (two) times daily.     . TURMERIC PO Take 940 mg by mouth 2 (two) times daily.    Marland Kitchen dextromethorphan-guaiFENesin (MUCINEX DM) 30-600 MG 12hr tablet Take 1 tablet by mouth 2 (two) times daily. (Patient not taking: Reported on 01/20/2018) 20 tablet 0   No current facility-administered medications on file prior to visit.      Objective:  Objective  Physical Exam Vitals signs and nursing note reviewed.  Constitutional:      General: He is sleeping.     Appearance: He is well-developed.  HENT:     Head: Normocephalic and atraumatic.  Eyes:     Pupils: Pupils are equal, round, and reactive to light.  Neck:     Musculoskeletal: Normal range of motion and neck supple.     Thyroid: No thyromegaly.  Cardiovascular:     Rate and Rhythm: Normal rate and regular rhythm.     Heart sounds: No murmur.  Pulmonary:     Effort: Pulmonary effort is normal. No respiratory distress.     Breath sounds:  Normal breath sounds. No wheezing or rales.  Chest:     Chest wall: No tenderness.  Musculoskeletal:        General: No tenderness.       Feet:  Skin:    General: Skin is warm and dry.     Findings: Erythema present.  Neurological:     Mental Status: He is oriented to person, place, and time.  Psychiatric:        Behavior: Behavior normal.        Thought Content: Thought content normal.        Judgment: Judgment normal.    BP 110/78 (BP Location: Right Arm, Patient Position: Sitting, Cuff Size: Normal)   Pulse 80   Temp 97.7 F (36.5 C) (Temporal)   Resp 18   Ht 6' (1.829 m)   Wt 193 lb 3.2 oz (87.6 kg)   SpO2 96%   BMI 26.20 kg/m  Wt Readings from Last 3 Encounters:  09/23/18 193 lb 3.2 oz (87.6 kg)  01/20/18 193 lb 2 oz (87.6 kg)  12/07/17 195 lb 8 oz (88.7 kg)     Lab Results  Component Value Date   WBC 9.2 06/10/2017   HGB 14.4 06/10/2017   HCT 42.8 06/10/2017   PLT 218.0 06/10/2017   GLUCOSE 84 06/10/2017   CHOL 141 06/10/2017   TRIG 116.0 06/10/2017   HDL 43.00 06/10/2017   LDLDIRECT 117.0 07/27/2006   LDLCALC 75 06/10/2017   ALT 17 06/10/2017   AST 23 06/10/2017   NA 140 06/10/2017   K 4.3 06/10/2017   CL 102 06/10/2017   CREATININE 0.93 06/10/2017   BUN 23 06/10/2017   CO2 27 06/10/2017   TSH 1.83 11/05/2011   PSA 2.28 11/17/2017   INR 0.91 07/29/2009   HGBA1C 5.7 06/10/2017   MICROALBUR 1.2 12/10/2009    No results found.   Assessment & Plan:  Plan  I am having Kourtney C. Heizer maintain his multivitamin, sertraline, timolol, aspirin, Calcium Carbonate-Vit D-Min (CALCIUM 1200 PO), Vitamin D3, GLUCOSAMINE-CHONDROITIN-MSM PO, Omega 3, TURMERIC PO, dextromethorphan-guaiFENesin, lisinopril, rosuvastatin, and doxycycline.  No orders of the defined types were placed in this encounter.   Problem List Items Addressed This Visit      Unprioritized   Spider bite - Primary    Finish doxy Call back Monday if not completely resolved        Other Visit Diagnoses    Need for influenza vaccination       Relevant Orders   Flu Vaccine QUAD High Dose(Fluad) (Completed)   Need for tetanus booster       Relevant Orders   Td : Tetanus/diphtheria >7yo Preservative  free (Completed)      Follow-up: Return for annual exam, fasting.  Ann Held, DO    ++

## 2018-10-04 ENCOUNTER — Other Ambulatory Visit: Payer: Self-pay | Admitting: Family Medicine

## 2018-10-06 DIAGNOSIS — U071 COVID-19: Secondary | ICD-10-CM

## 2018-10-06 HISTORY — DX: COVID-19: U07.1

## 2018-10-14 ENCOUNTER — Other Ambulatory Visit: Payer: Self-pay | Admitting: Family Medicine

## 2018-10-16 DIAGNOSIS — R05 Cough: Secondary | ICD-10-CM | POA: Diagnosis not present

## 2018-10-16 DIAGNOSIS — Z20828 Contact with and (suspected) exposure to other viral communicable diseases: Secondary | ICD-10-CM | POA: Diagnosis not present

## 2018-11-11 ENCOUNTER — Other Ambulatory Visit: Payer: Self-pay | Admitting: *Deleted

## 2018-11-11 MED ORDER — LISINOPRIL 20 MG PO TABS: ORAL_TABLET | ORAL | 0 refills | Status: DC

## 2018-12-05 ENCOUNTER — Other Ambulatory Visit: Payer: Self-pay

## 2018-12-06 ENCOUNTER — Other Ambulatory Visit: Payer: Self-pay

## 2018-12-06 ENCOUNTER — Ambulatory Visit (INDEPENDENT_AMBULATORY_CARE_PROVIDER_SITE_OTHER): Payer: Medicare PPO | Admitting: Family Medicine

## 2018-12-06 ENCOUNTER — Encounter: Payer: Self-pay | Admitting: Family Medicine

## 2018-12-06 VITALS — BP 108/68 | HR 79 | Temp 97.3°F | Resp 18 | Ht 72.0 in | Wt 194.0 lb

## 2018-12-06 DIAGNOSIS — F3289 Other specified depressive episodes: Secondary | ICD-10-CM | POA: Diagnosis not present

## 2018-12-06 DIAGNOSIS — R739 Hyperglycemia, unspecified: Secondary | ICD-10-CM

## 2018-12-06 DIAGNOSIS — K635 Polyp of colon: Secondary | ICD-10-CM

## 2018-12-06 DIAGNOSIS — R7309 Other abnormal glucose: Secondary | ICD-10-CM

## 2018-12-06 DIAGNOSIS — E785 Hyperlipidemia, unspecified: Secondary | ICD-10-CM | POA: Diagnosis not present

## 2018-12-06 DIAGNOSIS — Z0000 Encounter for general adult medical examination without abnormal findings: Secondary | ICD-10-CM | POA: Diagnosis not present

## 2018-12-06 DIAGNOSIS — I10 Essential (primary) hypertension: Secondary | ICD-10-CM

## 2018-12-06 DIAGNOSIS — C61 Malignant neoplasm of prostate: Secondary | ICD-10-CM | POA: Diagnosis not present

## 2018-12-06 NOTE — Patient Instructions (Signed)
Preventive Care 75 Years and Older, Male Preventive care refers to lifestyle choices and visits with your health care provider that can promote health and wellness. This includes:  A yearly physical exam. This is also called an annual well check.  Regular dental and eye exams.  Immunizations.  Screening for certain conditions.  Healthy lifestyle choices, such as diet and exercise. What can I expect for my preventive care visit? Physical exam Your health care provider will check:  Height and weight. These may be used to calculate body mass index (BMI), which is a measurement that tells if you are at a healthy weight.  Heart rate and blood pressure.  Your skin for abnormal spots. Counseling Your health care provider may ask you questions about:  Alcohol, tobacco, and drug use.  Emotional well-being.  Home and relationship well-being.  Sexual activity.  Eating habits.  History of falls.  Memory and ability to understand (cognition).  Work and work Statistician. What immunizations do I need?  Influenza (flu) vaccine  This is recommended every year. Tetanus, diphtheria, and pertussis (Tdap) vaccine  You may need a Td booster every 10 years. Varicella (chickenpox) vaccine  You may need this vaccine if you have not already been vaccinated. Zoster (shingles) vaccine  You may need this after age 50. Pneumococcal conjugate (PCV13) vaccine  One dose is recommended after age 24. Pneumococcal polysaccharide (PPSV23) vaccine  One dose is recommended after age 33. Measles, mumps, and rubella (MMR) vaccine  You may need at least one dose of MMR if you were born in 1957 or later. You may also need a second dose. Meningococcal conjugate (MenACWY) vaccine  You may need this if you have certain conditions. Hepatitis A vaccine  You may need this if you have certain conditions or if you travel or work in places where you may be exposed to hepatitis A. Hepatitis B vaccine   You may need this if you have certain conditions or if you travel or work in places where you may be exposed to hepatitis B. Haemophilus influenzae type b (Hib) vaccine  You may need this if you have certain conditions. You may receive vaccines as individual doses or as more than one vaccine together in one shot (combination vaccines). Talk with your health care provider about the risks and benefits of combination vaccines. What tests do I need? Blood tests  Lipid and cholesterol levels. These may be checked every 5 years, or more frequently depending on your overall health.  Hepatitis C test.  Hepatitis B test. Screening  Lung cancer screening. You may have this screening every year starting at age 74 if you have a 30-pack-year history of smoking and currently smoke or have quit within the past 15 years.  Colorectal cancer screening. All adults should have this screening starting at age 57 and continuing until age 54. Your health care provider may recommend screening at age 47 if you are at increased risk. You will have tests every 1-10 years, depending on your results and the type of screening test.  Prostate cancer screening. Recommendations will vary depending on your family history and other risks.  Diabetes screening. This is done by checking your blood sugar (glucose) after you have not eaten for a while (fasting). You may have this done every 1-3 years.  Abdominal aortic aneurysm (AAA) screening. You may need this if you are a current or former smoker.  Sexually transmitted disease (STD) testing. Follow these instructions at home: Eating and drinking  Eat  a diet that includes fresh fruits and vegetables, whole grains, lean protein, and low-fat dairy products. Limit your intake of foods with high amounts of sugar, saturated fats, and salt.  Take vitamin and mineral supplements as recommended by your health care provider.  Do not drink alcohol if your health care provider  tells you not to drink.  If you drink alcohol: ? Limit how much you have to 0-2 drinks a day. ? Be aware of how much alcohol is in your drink. In the [place] one drink equals one 12 oz bottle of beer (355 mL), one 5 oz glass of wine (148 mL), or one 1 oz glass of hard liquor (44 mL). Lifestyle  Take daily care of your teeth and gums.  Stay active. Exercise for at least 30 minutes on 5 or more days each week.  Do not use any products that contain nicotine or tobacco, such as cigarettes, e-cigarettes, and chewing tobacco. If you need help quitting, ask your health care provider.  If you are sexually active, practice safe sex. Use a condom or other form of protection to prevent STIs (sexually transmitted infections).  Talk with your health care provider about taking a low-dose aspirin or statin. What's next?  Visit your health care provider once a year for a well check visit.  Ask your health care provider how often you should have your eyes and teeth checked.  Stay up to date on all vaccines. This information is not intended to replace advice given to you by your health care provider. Make sure you discuss any questions you have with your health care provider. Document Released: 01/18/2015 Document Revised: 12/16/2017 Document Reviewed: 12/16/2017 Elsevier Patient Education  2020 Elsevier Inc.  

## 2018-12-06 NOTE — Assessment & Plan Note (Signed)
ghm utd Check labs  See AVs  

## 2018-12-06 NOTE — Assessment & Plan Note (Signed)
Well controlled, no changes to meds. Encouraged heart healthy diet such as the DASH diet and exercise as tolerated.  °

## 2018-12-06 NOTE — Assessment & Plan Note (Signed)
Stable con't meds 

## 2018-12-06 NOTE — Progress Notes (Signed)
Patient ID: Antonio Stephens, male    DOB: [birth year]  Age: 73 y.o. MRN: [id number]    Subjective:  Subjective  HPI Antonio Stephens presents for cpe and labs  Pt has no new complaints except funny feeling on left ear   Review of Systems  Constitutional: Negative for appetite change, diaphoresis, fatigue and unexpected weight change.  Eyes: Negative for pain, redness and visual disturbance.  Respiratory: Negative for cough, chest tightness, shortness of breath and wheezing.   Cardiovascular: Negative for chest pain, palpitations and leg swelling.  Endocrine: Negative for cold intolerance, heat intolerance, polydipsia, polyphagia and polyuria.  Genitourinary: Negative for difficulty urinating, dysuria and frequency.  Neurological: Negative for dizziness, light-headedness, numbness and headaches.    History Past Medical History:  Diagnosis Date  . Back pain   . 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 Depression   . Glaucoma    bilateral  . Hyperlipidemia   . Hypertension   . Urinary retention     He has a past surgical history that includes Knee arthroscopy (04/2005); inguinal Herniorrhaphy; 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; Total hip arthroplasty (12/16/09); Knee surgery (09-2009); Colonoscopy; Partial knee arthroplasty (Left, 10.10.16); Hip resurfacing (Right, 11/04/2015); and Prostate biopsy (02/2016).   His family history includes Alzheimer's disease (age of onset: 58) in his mother; Breast cancer in his sister; Cancer (age of onset: 72) in his sister; Cancer (age of onset: 65) in his father; Hyperlipidemia in his paternal grandfather.He reports that he quit smoking about 51 years ago. His smoking use included cigarettes. He quit after 0.20 years of use. He has never used smokeless tobacco. He reports current alcohol use of about 7.0 - 10.0 standard drinks of alcohol per week. He reports that he does not  use drugs.  Current Outpatient Medications on File Prior to Visit  Medication Sig Dispense Refill  . aspirin 81 MG tablet Take 81 mg by mouth daily.    . Calcium Carbonate-Vit D-Min (CALCIUM 1200 PO) Take 4 tablets by mouth daily. Raw calcium    . Cholecalciferol (VITAMIN D3) 2000 units TABS Take 1 capsule by mouth daily.    Marland Kitchen GLUCOSAMINE-CHONDROITIN-MSM PO Take 3,200 mg by mouth 2 (two) times daily.    Marland Kitchen lisinopril (ZESTRIL) 20 MG tablet TAKE 1 TABLET(20 MG) BY MOUTH DAILY. 90 tablet 0  . multivitamin (THERAGRAN) per tablet Take 1 tablet by mouth daily.      . Omega 3 1200 MG CAPS Take 1 capsule by mouth 2 (two) times daily.    . rosuvastatin (CRESTOR) 20 MG tablet TAKE 1 TABLET(20 MG) BY MOUTH DAILY 90 tablet 1  . sertraline (ZOLOFT) 100 MG tablet Take 100 mg by mouth daily.      . timolol (TIMOPTIC) 0.5 % ophthalmic solution Place 1 drop into the left eye 2 (two) times daily.     . TURMERIC PO Take 940 mg by mouth 2 (two) times daily.     No current facility-administered medications on file prior to visit.      Objective:  Objective  Physical Exam Vitals signs and nursing note reviewed.  Constitutional:      General: He is not in acute distress.    Appearance: He is well-developed. He is not diaphoretic.  HENT:     Head: Normocephalic and atraumatic.     Right Ear: External ear normal.     Left Ear: External ear normal.  Nose: Nose normal.     Mouth/Throat:     Pharynx: No oropharyngeal exudate.  Eyes:     General:        Right eye: No discharge.        Left eye: No discharge.     Conjunctiva/sclera: Conjunctivae normal.     Pupils: Pupils are equal, round, and reactive to light.  Neck:     Musculoskeletal: Normal range of motion and neck supple.     Thyroid: No thyromegaly.     Vascular: No JVD.  Cardiovascular:     Rate and Rhythm: Normal rate and regular rhythm.     Heart sounds: No murmur. No friction rub. No gallop.   Pulmonary:     Effort: Pulmonary effort  is normal. No respiratory distress.     Breath sounds: Normal breath sounds. No wheezing or rales.  Chest:     Chest wall: No tenderness.  Abdominal:     General: Bowel sounds are normal. There is no distension.     Palpations: Abdomen is soft. There is no mass.     Tenderness: There is no abdominal tenderness. There is no guarding or rebound.  Genitourinary:    Comments: Per urology Musculoskeletal: Normal range of motion.        General: No tenderness.  Lymphadenopathy:     Cervical: No cervical adenopathy.  Skin:    General: Skin is warm and dry.     Coloration: Skin is not pale.     Findings: No erythema or rash.  Neurological:     Mental Status: He is alert and oriented to person, place, and time.     Motor: No abnormal muscle tone.     Deep Tendon Reflexes: Reflexes are normal and symmetric. Reflexes normal.  Psychiatric:        Behavior: Behavior normal.        Thought Content: Thought content normal.        Judgment: Judgment normal.    BP 108/68 (BP Location: Right Arm, Patient Position: Sitting, Cuff Size: Normal)   Pulse 79   Temp (!) 97.3 F (36.3 C) (Temporal)   Resp 18   Ht 6' (1.829 m)   Wt 194 lb (88 kg)   SpO2 97%   BMI 26.31 kg/m  Wt Readings from Last 3 Encounters:  12/06/18 194 lb (88 kg)  09/23/18 193 lb 3.2 oz (87.6 kg)  01/20/18 193 lb 2 oz (87.6 kg)     Lab Results  Component Value Date   WBC 9.2 06/10/2017   HGB 14.4 06/10/2017   HCT 42.8 06/10/2017   PLT 218.0 06/10/2017   GLUCOSE 84 06/10/2017   CHOL 141 06/10/2017   TRIG 116.0 06/10/2017   HDL 43.00 06/10/2017   LDLDIRECT 117.0 07/27/2006   LDLCALC 75 06/10/2017   ALT 17 06/10/2017   AST 23 06/10/2017   NA 140 06/10/2017   K 4.3 06/10/2017   CL 102 06/10/2017   CREATININE 0.93 06/10/2017   BUN 23 06/10/2017   CO2 27 06/10/2017   TSH 1.83 11/05/2011   PSA 2.28 11/17/2017   INR 0.91 07/29/2009   HGBA1C 5.7 06/10/2017   MICROALBUR 1.2 12/10/2009    No results found.    Assessment & Plan:  Plan  I have discontinued Sundance C. Servais's dextromethorphan-guaiFENesin and doxycycline. I am also having him maintain his multivitamin, sertraline, timolol, aspirin, Calcium Carbonate-Vit D-Min (CALCIUM 1200 PO), Vitamin D3, GLUCOSAMINE-CHONDROITIN-MSM PO, Omega 3, TURMERIC PO, rosuvastatin, and lisinopril.  No orders of the defined types were placed in this encounter.   Problem List Items Addressed This Visit      Unprioritized   Essential hypertension   Hyperlipidemia   Relevant Orders   Lipid panel   Comprehensive metabolic panel   Preventative health care - Primary   Prostate cancer [facility])   Relevant Orders   PSA   CBC with Differential    Other Visit Diagnoses    Hyperglycemia       Relevant Orders   Hemoglobin A1c   Polyp of colon, unspecified part of colon, unspecified type       Relevant Orders   Ambulatory referral to Gastroenterology      Follow-up: Return in about 6 months (around 06/06/2019), or if symptoms worsen or fail to improve, for hyperlipidemia, hypertension.  Ann Held, DO

## 2018-12-06 NOTE — Assessment & Plan Note (Signed)
Per oncology °

## 2018-12-06 NOTE — Assessment & Plan Note (Signed)
Recheck labs 

## 2018-12-06 NOTE — Assessment & Plan Note (Signed)
Tolerating statin, encouraged heart healthy diet, avoid trans fats, minimize simple carbs and saturated fats. Increase exercise as tolerated 

## 2018-12-07 LAB — COMPREHENSIVE METABOLIC PANEL
ALT: 16 U/L (ref 0–53)
AST: 20 U/L (ref 0–37)
Albumin: 4.1 g/dL (ref 3.5–5.2)
Alkaline Phosphatase: 75 U/L (ref 39–117)
BUN: 27 mg/dL — ABNORMAL HIGH (ref 6–23)
CO2: 31 mEq/L (ref 19–32)
Calcium: 9.2 mg/dL (ref 8.4–10.5)
Chloride: 102 mEq/L (ref 96–112)
Creatinine, Ser: 0.96 mg/dL (ref 0.40–1.50)
GFR: 76.34 mL/min (ref >60.00)
Glucose, Bld: 68 mg/dL — ABNORMAL LOW (ref 70–99)
Potassium: 4.2 mEq/L (ref 3.5–5.1)
Sodium: 141 mEq/L (ref 135–145)
Total Bilirubin: 0.6 mg/dL (ref 0.2–1.2)
Total Protein: 6.1 g/dL (ref 6.0–8.3)

## 2018-12-07 LAB — HEMOGLOBIN A1C: Hgb A1c MFr Bld: 5.6 % (ref 4.6–6.5)

## 2018-12-07 LAB — CBC WITH DIFFERENTIAL/PLATELET
Basophils Absolute: 0.1 10*3/uL (ref 0.0–0.1)
Basophils Relative: 1.2 % (ref 0.0–3.0)
Eosinophils Absolute: 0.3 10*3/uL (ref 0.0–0.7)
Eosinophils Relative: 3.7 % (ref 0.0–5.0)
HCT: 41.3 % (ref 39.0–52.0)
Hemoglobin: 14.1 g/dL (ref 13.0–17.0)
Lymphocytes Relative: 25 % (ref 12.0–46.0)
Lymphs Abs: 1.8 10*3/uL (ref 0.7–4.0)
MCHC: 34 g/dL (ref 30.0–36.0)
MCV: 94.8 fl (ref 78.0–100.0)
Monocytes Absolute: 0.7 10*3/uL (ref 0.1–1.0)
Monocytes Relative: 10.2 % (ref 3.0–12.0)
Neutro Abs: 4.4 10*3/uL (ref 1.4–7.7)
Neutrophils Relative %: 59.9 % (ref 43.0–77.0)
Platelets: 187 10*3/uL (ref 150.0–400.0)
RBC: 4.36 Mil/uL (ref 4.22–5.81)
RDW: 14.4 % (ref 11.5–15.5)
WBC: 7.3 10*3/uL (ref 4.0–10.5)

## 2018-12-07 LAB — LIPID PANEL
Cholesterol: 164 mg/dL (ref 0–200)
HDL: 50 mg/dL (ref >39.00)
NonHDL: 114.23
Total CHOL/HDL Ratio: 3
Triglycerides: 213 mg/dL — ABNORMAL HIGH (ref 0.0–149.0)
VLDL: 42.6 mg/dL — ABNORMAL HIGH (ref 0.0–40.0)

## 2018-12-07 LAB — LDL CHOLESTEROL, DIRECT: Direct LDL: 89 mg/dL

## 2018-12-07 LAB — PSA: PSA: 0.88 ng/mL (ref 0.10–4.00)

## 2018-12-12 ENCOUNTER — Encounter: Payer: Self-pay | Admitting: Family Medicine

## 2019-02-06 DIAGNOSIS — C61 Malignant neoplasm of prostate: Secondary | ICD-10-CM | POA: Diagnosis not present

## 2019-02-15 ENCOUNTER — Other Ambulatory Visit: Payer: Self-pay | Admitting: Family Medicine

## 2019-02-16 DIAGNOSIS — Z20822 Contact with and (suspected) exposure to covid-19: Secondary | ICD-10-CM | POA: Diagnosis not present

## 2019-05-01 ENCOUNTER — Other Ambulatory Visit: Payer: Self-pay

## 2019-05-01 MED ORDER — ROSUVASTATIN CALCIUM 20 MG PO TABS: ORAL_TABLET | ORAL | 0 refills | Status: DC

## 2019-05-01 NOTE — Telephone Encounter (Signed)
RX sent

## 2019-05-01 NOTE — Telephone Encounter (Signed)
Patient called in to see if DR. Lowne could send in a prescription for rosuvastatin (CRESTOR) 20 MG tablet [id number]    Please send it to [facility] [place], [address] AT [facility] [address]  [place], [place] [address]  Phone:  [phone] Fax:  [phone]  DEA #:  [id number]

## 2019-06-14 ENCOUNTER — Telehealth (INDEPENDENT_AMBULATORY_CARE_PROVIDER_SITE_OTHER): Payer: Medicare PPO | Admitting: Family Medicine

## 2019-06-14 ENCOUNTER — Encounter: Payer: Self-pay | Admitting: Family Medicine

## 2019-06-14 ENCOUNTER — Other Ambulatory Visit: Payer: Self-pay

## 2019-06-14 VITALS — BP 123/71 | Temp 97.7°F | Ht 72.0 in | Wt 190.0 lb

## 2019-06-14 DIAGNOSIS — S70262A Insect bite (nonvenomous), left hip, initial encounter: Secondary | ICD-10-CM

## 2019-06-14 DIAGNOSIS — W57XXXA Bitten or stung by nonvenomous insect and other nonvenomous arthropods, initial encounter: Secondary | ICD-10-CM

## 2019-06-14 MED ORDER — DOXYCYCLINE HYCLATE 100 MG PO TABS: 100.0000 mg | ORAL_TABLET | Freq: Two times a day (BID) | ORAL | 0 refills | Status: DC

## 2019-06-14 NOTE — Progress Notes (Signed)
Virtual Visit via Video Note  I connected with Antonio Stephens on 06/14/19 at  8:20 AM EDT by a video enabled telemedicine application and verified that I am speaking with the correct person using two identifiers.  Location: Patient: home with wife  Provider: office    I discussed the limitations of evaluation and management by telemedicine and the availability of in person appointments. The patient expressed understanding and agreed to proceed.  History of Present Illness: Pt is home with his wife.  He found a tick on him on his L hip.   It is now red and hot to touch.  No other symptoms    Observations/Objective: Vitals:   06/14/19 0818  BP: 123/71  Temp: 97.7 F (36.5 C)  pt is nad L hip---  +elongated area of redness on L hip with bite mark in center  Warm to touch per pt   Assessment and Plan: 1. Tick bite of left hip, initial encounter Pt getting labs today Doxy sent to pharmacy Pt aware we may need to repeat labs in 3-4 weeks if symptoms develop or redness does not resolve - doxycycline (VIBRA-TABS) 100 MG tablet; Take 1 tablet (100 mg total) by mouth 2 (two) times daily.  Dispense: 20 tablet; Refill: 0 - B. burgdorfi antibodies; Future - Rocky mtn spotted fvr abs pnl(IgG+IgM); Future  Follow Up Instructions:    I discussed the assessment and treatment plan with the patient. The patient was provided an opportunity to ask questions and all were answered. The patient agreed with the plan and demonstrated an understanding of the instructions.   The patient was advised to call back or seek an in-person evaluation if the symptoms worsen or if the condition fails to improve as anticipated.  I provided 25 minutes of non-face-to-face time during this encounter.   Ann Held, DO

## 2019-06-14 NOTE — Addendum Note (Signed)
Addended by: Kelle Darting A on: 06/14/2019 01:18 PM   Modules accepted: Orders

## 2019-06-15 LAB — B. BURGDORFI ANTIBODIES: B burgdorferi Ab IgG+IgM: <0.9 index

## 2019-06-15 LAB — ROCKY MTN SPOTTED FVR ABS PNL(IGG+IGM)
RMSF IgG: NOT DETECTED
RMSF IgM: NOT DETECTED

## 2019-06-23 ENCOUNTER — Telehealth: Payer: Self-pay

## 2019-06-23 NOTE — Telephone Encounter (Signed)
Please advise 

## 2019-06-23 NOTE — Telephone Encounter (Signed)
VM left on home with advise

## 2019-06-23 NOTE — Telephone Encounter (Signed)
Pt called stating he was seen on 6/9 and treated for a tick bit and his antibiotic is ending today.  He stated the knot is still on his skin form the bite and si wondering if he will need another round of antibiotics.  He stated he did fail to take the antibiotic one day 2x that particular day.  He is also wondering if he needs to be re-tested for Lyme's disease for any reason at this point in time?  P tdoes not want to go into the weekend w/out a response.  Please advise.

## 2019-06-23 NOTE — Telephone Encounter (Signed)
Testing this soon would not be beneficial unless he developed new symptoms --- its ok to have a Kimber bump as long as there is no fever, rash  I would wait to recheck for another 1-2 weeks

## 2019-08-08 ENCOUNTER — Other Ambulatory Visit: Payer: Self-pay | Admitting: Family Medicine

## 2019-08-21 DIAGNOSIS — C61 Malignant neoplasm of prostate: Secondary | ICD-10-CM | POA: Diagnosis not present

## 2019-08-27 ENCOUNTER — Other Ambulatory Visit: Payer: Self-pay | Admitting: Family Medicine

## 2019-09-18 DIAGNOSIS — H02839 Dermatochalasis of unspecified eye, unspecified eyelid: Secondary | ICD-10-CM | POA: Diagnosis not present

## 2019-09-18 DIAGNOSIS — Z961 Presence of intraocular lens: Secondary | ICD-10-CM | POA: Diagnosis not present

## 2019-09-18 DIAGNOSIS — H401231 Low-tension glaucoma, bilateral, mild stage: Secondary | ICD-10-CM | POA: Diagnosis not present

## 2019-09-18 DIAGNOSIS — I10 Essential (primary) hypertension: Secondary | ICD-10-CM | POA: Diagnosis not present

## 2019-11-07 ENCOUNTER — Other Ambulatory Visit: Payer: Self-pay | Admitting: Family Medicine

## 2019-12-02 ENCOUNTER — Other Ambulatory Visit: Payer: Self-pay | Admitting: Family Medicine

## 2020-02-17 ENCOUNTER — Other Ambulatory Visit: Payer: Self-pay | Admitting: Family Medicine

## 2020-02-19 DIAGNOSIS — C61 Malignant neoplasm of prostate: Secondary | ICD-10-CM | POA: Diagnosis not present

## 2020-03-10 ENCOUNTER — Other Ambulatory Visit: Payer: Self-pay | Admitting: Family Medicine

## 2020-03-19 DIAGNOSIS — R102 Pelvic and perineal pain: Secondary | ICD-10-CM | POA: Diagnosis not present

## 2020-03-19 DIAGNOSIS — M25562 Pain in left knee: Secondary | ICD-10-CM | POA: Diagnosis not present

## 2020-03-19 DIAGNOSIS — M25561 Pain in right knee: Secondary | ICD-10-CM | POA: Diagnosis not present

## 2020-03-21 ENCOUNTER — Encounter: Payer: Self-pay | Admitting: Family Medicine

## 2020-03-21 ENCOUNTER — Ambulatory Visit (INDEPENDENT_AMBULATORY_CARE_PROVIDER_SITE_OTHER): Payer: Medicare PPO | Admitting: Family Medicine

## 2020-03-21 ENCOUNTER — Other Ambulatory Visit: Payer: Self-pay

## 2020-03-21 VITALS — BP 110/70 | HR 73 | Temp 97.8°F | Resp 18 | Ht 72.0 in | Wt 196.4 lb

## 2020-03-21 DIAGNOSIS — C61 Malignant neoplasm of prostate: Secondary | ICD-10-CM

## 2020-03-21 DIAGNOSIS — N4830 Priapism, unspecified: Secondary | ICD-10-CM

## 2020-03-21 DIAGNOSIS — N5312 Painful ejaculation: Secondary | ICD-10-CM

## 2020-03-21 DIAGNOSIS — E785 Hyperlipidemia, unspecified: Secondary | ICD-10-CM | POA: Diagnosis not present

## 2020-03-21 DIAGNOSIS — Z0000 Encounter for general adult medical examination without abnormal findings: Secondary | ICD-10-CM | POA: Diagnosis not present

## 2020-03-21 DIAGNOSIS — I10 Essential (primary) hypertension: Secondary | ICD-10-CM | POA: Diagnosis not present

## 2020-03-21 DIAGNOSIS — R5383 Other fatigue: Secondary | ICD-10-CM

## 2020-03-21 NOTE — Assessment & Plan Note (Signed)
Encouraged heart healthy diet, increase exercise, avoid trans fats, consider a krill oil cap daily 

## 2020-03-21 NOTE — Assessment & Plan Note (Signed)
Per u rology 

## 2020-03-21 NOTE — Assessment & Plan Note (Signed)
ghm utd Check labs  Pt had covid vaccines but does not have his card

## 2020-03-21 NOTE — Progress Notes (Signed)
Patient ID: Antonio Stephens, male    DOB: December 11, 1945  Age: 75 y.o. MRN: [id number]    Subjective:  Subjective  HPI [name] presents for cpe and f/u chol and htn  He is also c/o painful ejaculation and would like to see a urologist at [facility].  He is only seeing a rad onc at [facility] right now for his prostate ca.   Review of Systems  Constitutional: Negative for appetite change, diaphoresis, fatigue and unexpected weight change.  Eyes: Negative for pain, redness and visual disturbance.  Respiratory: Negative for cough, chest tightness, shortness of breath and wheezing.   Cardiovascular: Negative for chest pain, palpitations and leg swelling.  Endocrine: Negative for cold intolerance, heat intolerance, polydipsia, polyphagia and polyuria.  Genitourinary: Negative for difficulty urinating, dysuria and frequency.  Neurological: Negative for dizziness, light-headedness, numbness and headaches.    History Past Medical History:  Diagnosis Date  . Back pain   . 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 Depression   . Glaucoma    bilateral  . Hyperlipidemia   . Hypertension   . Urinary retention     He has a past surgical history that includes Knee arthroscopy (04/2005); inguinal Herniorrhaphy; 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; Total hip arthroplasty (12/16/09); Knee surgery (09-2009); Colonoscopy; Partial knee arthroplasty (Left, 10.10.16); Hip resurfacing (Right, 11/04/2015); and Prostate biopsy (02/2016).   His family history includes Alzheimer's disease (age of onset: 68) in his mother; Breast cancer in his sister; Cancer (age of onset: 56) in his sister; Cancer (age of onset: 21) in his father; Hyperlipidemia in his paternal grandfather.He reports that he quit smoking about 52 years ago. His smoking use included cigarettes. He quit after 0.20 years of use. He has never used smokeless tobacco. He reports current  alcohol use of about 7.0 - 10.0 standard drinks of alcohol per week. He reports that he does not use drugs.  Current Outpatient Medications on File Prior to Visit  Medication Sig Dispense Refill  . aspirin 81 MG tablet Take 81 mg by mouth daily.    . Calcium Carbonate-Vit D-Min (CALCIUM 1200 PO) Take 4 tablets by mouth daily. Raw calcium    . Cholecalciferol (VITAMIN D3) 2000 units TABS Take 1 capsule by mouth daily.    Marland Kitchen GLUCOSAMINE-CHONDROITIN-MSM PO Take 3,200 mg by mouth 2 (two) times daily.    Marland Kitchen lisinopril (ZESTRIL) 20 MG tablet Take 1 tablet (20 mg total) by mouth daily. 90 tablet 1  . multivitamin (THERAGRAN) per tablet Take 1 tablet by mouth daily.    . Omega 3 1200 MG CAPS Take 1 capsule by mouth 2 (two) times daily.    . rosuvastatin (CRESTOR) 20 MG tablet TAKE 1 TABLET(20 MG) BY MOUTH DAILY 90 tablet 0  . sertraline (ZOLOFT) 100 MG tablet Take 100 mg by mouth daily.    . timolol (TIMOPTIC) 0.5 % ophthalmic solution Place 1 drop into the left eye 2 (two) times daily.     . TURMERIC PO Take 940 mg by mouth 2 (two) times daily.     No current facility-administered medications on file prior to visit.     Objective:  Objective  Physical Exam Vitals and nursing note reviewed.  Constitutional:      General: He is not in acute distress.    Appearance: He is well-developed. He is not diaphoretic.  HENT:     Head: Normocephalic and  atraumatic.     Right Ear: External ear normal.     Left Ear: External ear normal.     Nose: Nose normal.     Mouth/Throat:     Pharynx: No oropharyngeal exudate.  Eyes:     General:        Right eye: No discharge.        Left eye: No discharge.     Conjunctiva/sclera: Conjunctivae normal.     Pupils: Pupils are equal, round, and reactive to light.  Neck:     Thyroid: No thyromegaly.     Vascular: No JVD.  Cardiovascular:     Rate and Rhythm: Normal rate and regular rhythm.     Heart sounds: No murmur heard.   Pulmonary:     Effort:  Pulmonary effort is normal. No respiratory distress.     Breath sounds: Normal breath sounds. No wheezing or rales.  Chest:     Chest wall: No tenderness.  Abdominal:     General: Bowel sounds are normal. There is no distension.     Palpations: Abdomen is soft. There is no mass.     Tenderness: There is no abdominal tenderness. There is no guarding or rebound.  Musculoskeletal:        General: No tenderness. Normal range of motion.     Cervical back: Normal range of motion and neck supple.  Lymphadenopathy:     Cervical: No cervical adenopathy.  Skin:    General: Skin is warm and dry.     Findings: No erythema or rash.  Neurological:     Mental Status: He is alert and oriented to person, place, and time.     Cranial Nerves: No cranial nerve deficit.     Motor: No abnormal muscle tone.     Deep Tendon Reflexes: Reflexes are normal and symmetric. Reflexes normal.  Psychiatric:        Behavior: Behavior normal.        Thought Content: Thought content normal.        Judgment: Judgment normal.    BP 110/70 (BP Location: Right Arm, Patient Position: Sitting, Cuff Size: Normal)   Pulse 73   Temp 97.8 F (36.6 C) (Oral)   Resp 18   Ht 6' (1.829 m)   Wt 196 lb 6.4 oz (89.1 kg)   SpO2 97%   BMI 26.64 kg/m  Wt Readings from Last 3 Encounters:  03/21/20 196 lb 6.4 oz (89.1 kg)  06/14/19 190 lb (86.2 kg)  12/06/18 194 lb (88 kg)     Lab Results  Component Value Date   WBC 7.3 12/06/2018   HGB 14.1 12/06/2018   HCT 41.3 12/06/2018   PLT 187.0 12/06/2018   GLUCOSE 68 (L) 12/06/2018   CHOL 164 12/06/2018   TRIG 213.0 (H) 12/06/2018   HDL 50.00 12/06/2018   LDLDIRECT 89.0 12/06/2018   LDLCALC 75 06/10/2017   ALT 16 12/06/2018   AST 20 12/06/2018   NA 141 12/06/2018   K 4.2 12/06/2018   CL 102 12/06/2018   CREATININE 0.96 12/06/2018   BUN 27 (H) 12/06/2018   CO2 31 12/06/2018   TSH 1.83 11/05/2011   PSA 0.88 12/06/2018   INR 0.91 07/29/2009   HGBA1C 5.6 12/06/2018    MICROALBUR 1.2 12/10/2009    No results found.   Assessment & Plan:  Plan  I have discontinued Kyng C. Monteverde's doxycycline. I am also having him maintain his multivitamin, sertraline, timolol, aspirin, Calcium Carbonate-Vit D-Min (CALCIUM  1200 PO), Vitamin D3, GLUCOSAMINE-CHONDROITIN-MSM PO, Omega 3, TURMERIC PO, rosuvastatin, and lisinopril.  No orders of the defined types were placed in this encounter.   Problem List Items Addressed This Visit      Unprioritized   Essential hypertension    Well controlled, no changes to meds. Encouraged heart healthy diet such as the DASH diet and exercise as tolerated.       Hyperlipidemia    Encouraged heart healthy diet, increase exercise, avoid trans fats, consider a krill oil cap daily      Relevant Orders   Lipid panel   CBC with Differential/Platelet   Comprehensive metabolic panel   Preventative health care - Primary    ghm utd Check labs  Pt had covid vaccines but does not have his card      Prostate cancer [facility])    Per urology      Relevant Orders   PSA   Ambulatory referral to Urology    Other Visit Diagnoses    Primary hypertension       Relevant Orders   Lipid panel   CBC with Differential/Platelet   Comprehensive metabolic panel   Other fatigue       Relevant Orders   TSH   CBC with Differential/Platelet   Vitamin B12   Painful erection       Painful ejaculation       Relevant Orders   Ambulatory referral to Urology      Follow-up: Return in about 6 months (around 09/21/2020), or if symptoms worsen or fail to improve, for hypertension, hyperlipidemia.  Ann Held, DO

## 2020-03-21 NOTE — Assessment & Plan Note (Signed)
No new symptoms.

## 2020-03-21 NOTE — Assessment & Plan Note (Signed)
Well controlled, no changes to meds. Encouraged heart healthy diet such as the DASH diet and exercise as tolerated.  °

## 2020-03-21 NOTE — Patient Instructions (Signed)
Preventive Care 77 Years and Older, Male Preventive care refers to lifestyle choices and visits with your health care provider that can promote health and wellness. This includes:  A yearly physical exam. This is also called an annual wellness visit.  Regular dental and eye exams.  Immunizations.  Screening for certain conditions.  Healthy lifestyle choices, such as: ? Eating a healthy diet. ? Getting regular exercise. ? Not using drugs or products that contain nicotine and tobacco. ? Limiting alcohol use. What can I expect for my preventive care visit? Physical exam Your health care provider will check your:  Height and weight. These may be used to calculate your BMI (body mass index). BMI is a measurement that tells if you are at a healthy weight.  Heart rate and blood pressure.  Body temperature.  Skin for abnormal spots. Counseling Your health care provider may ask you questions about your:  Past medical problems.  Family's medical history.  Alcohol, tobacco, and drug use.  Emotional well-being.  Home life and relationship well-being.  Sexual activity.  Diet, exercise, and sleep habits.  History of falls.  Memory and ability to understand (cognition).  Work and work environment.  Access to firearms. What immunizations do I need? Vaccines are usually given at various ages, according to a schedule. Your health care provider will recommend vaccines for you based on your age, medical history, and lifestyle or other factors, such as travel or where you work.   What tests do I need? Blood tests  Lipid and cholesterol levels. These may be checked every 5 years, or more often depending on your overall health.  Hepatitis C test.  Hepatitis B test. Screening  Lung cancer screening. You may have this screening every year starting at age 55 if you have a 30-pack-year history of smoking and currently smoke or have quit within the past 15 years.  Colorectal  cancer screening. ? All adults should have this screening starting at age 50 and continuing until age 75. ? Your health care provider may recommend screening at age 45 if you are at increased risk. ? You will have tests every 1-10 years, depending on your results and the type of screening test.  Prostate cancer screening. Recommendations will vary depending on your family history and other risks.  Genital exam to check for testicular cancer or hernias.  Diabetes screening. ? This is done by checking your blood sugar (glucose) after you have not eaten for a while (fasting). ? You may have this done every 1-3 years.  Abdominal aortic aneurysm (AAA) screening. You may need this if you are a current or former smoker.  STD (sexually transmitted disease) testing, if you are at risk. Follow these instructions at home: Eating and drinking  Eat a diet that includes fresh fruits and vegetables, whole grains, lean protein, and low-fat dairy products. Limit your intake of foods with high amounts of sugar, saturated fats, and salt.  Take vitamin and mineral supplements as recommended by your health care provider.  Do not drink alcohol if your health care provider tells you not to drink.  If you drink alcohol: ? Limit how much you have to 0-2 drinks a day. ? Be aware of how much alcohol is in your drink. In the [place] one drink equals one 12 oz bottle of beer (355 mL), one 5 oz glass of wine (148 mL), or one 1 oz glass of hard liquor (44 mL).   Lifestyle  Take daily care of your teeth   and gums. Brush your teeth every morning and night with fluoride toothpaste. Floss one time each day.  Stay active. Exercise for at least 30 minutes 5 or more days each week.  Do not use any products that contain nicotine or tobacco, such as cigarettes, e-cigarettes, and chewing tobacco. If you need help quitting, ask your health care provider.  Do not use drugs.  If you are sexually active, practice safe sex.  Use a condom or other form of protection to prevent STIs (sexually transmitted infections).  Talk with your health care provider about taking a low-dose aspirin or statin.  Find healthy ways to cope with stress, such as: ? Meditation, yoga, or listening to music. ? Journaling. ? Talking to a trusted person. ? Spending time with friends and family. Safety  Always wear your seat belt while driving or riding in a vehicle.  Do not drive: ? If you have been drinking alcohol. Do not ride with someone who has been drinking. ? When you are tired or distracted. ? While texting.  Wear a helmet and other protective equipment during sports activities.  If you have firearms in your house, make sure you follow all gun safety procedures. What's next?  Visit your health care provider once a year for an annual wellness visit.  Ask your health care provider how often you should have your eyes and teeth checked.  Stay up to date on all vaccines. This information is not intended to replace advice given to you by your health care provider. Make sure you discuss any questions you have with your health care provider. Document Revised: 09/20/2018 Document Reviewed: 12/16/2017 Elsevier Patient Education  2021 Elsevier Inc.  

## 2020-03-22 LAB — TSH: TSH: 2.04 u[IU]/mL (ref 0.35–4.50)

## 2020-03-22 LAB — COMPREHENSIVE METABOLIC PANEL
ALT: 16 U/L (ref 0–53)
AST: 22 U/L (ref 0–37)
Albumin: 4.2 g/dL (ref 3.5–5.2)
Alkaline Phosphatase: 55 U/L (ref 39–117)
BUN: 22 mg/dL (ref 6–23)
CO2: 29 mEq/L (ref 19–32)
Calcium: 9.4 mg/dL (ref 8.4–10.5)
Chloride: 103 mEq/L (ref 96–112)
Creatinine, Ser: 0.98 mg/dL (ref 0.40–1.50)
GFR: 74.98 mL/min (ref >60.00)
Glucose, Bld: 85 mg/dL (ref 70–99)
Potassium: 4.7 mEq/L (ref 3.5–5.1)
Sodium: 140 mEq/L (ref 135–145)
Total Bilirubin: 1.2 mg/dL (ref 0.2–1.2)
Total Protein: 6.1 g/dL (ref 6.0–8.3)

## 2020-03-22 LAB — CBC WITH DIFFERENTIAL/PLATELET
Basophils Absolute: 0.1 10*3/uL (ref 0.0–0.1)
Basophils Relative: 1.2 % (ref 0.0–3.0)
Eosinophils Absolute: 0.2 10*3/uL (ref 0.0–0.7)
Eosinophils Relative: 3.1 % (ref 0.0–5.0)
HCT: 41.5 % (ref 39.0–52.0)
Hemoglobin: 14.1 g/dL (ref 13.0–17.0)
Lymphocytes Relative: 38.9 % (ref 12.0–46.0)
Lymphs Abs: 2.5 10*3/uL (ref 0.7–4.0)
MCHC: 34.1 g/dL (ref 30.0–36.0)
MCV: 93.4 fl (ref 78.0–100.0)
Monocytes Absolute: 0.7 10*3/uL (ref 0.1–1.0)
Monocytes Relative: 11.5 % (ref 3.0–12.0)
Neutro Abs: 2.9 10*3/uL (ref 1.4–7.7)
Neutrophils Relative %: 45.3 % (ref 43.0–77.0)
Platelets: 180 10*3/uL (ref 150.0–400.0)
RBC: 4.44 Mil/uL (ref 4.22–5.81)
RDW: 13.6 % (ref 11.5–15.5)
WBC: 6.3 10*3/uL (ref 4.0–10.5)

## 2020-03-22 LAB — LIPID PANEL
Cholesterol: 149 mg/dL (ref 0–200)
HDL: 49.8 mg/dL (ref >39.00)
LDL Cholesterol: 82 mg/dL (ref 0–99)
NonHDL: 99.18
Total CHOL/HDL Ratio: 3
Triglycerides: 87 mg/dL (ref 0.0–149.0)
VLDL: 17.4 mg/dL (ref 0.0–40.0)

## 2020-03-22 LAB — VITAMIN B12: Vitamin B-12: 305 pg/mL (ref 211–911)

## 2020-03-22 LAB — PSA: PSA: 0.31 ng/mL (ref 0.10–4.00)

## 2020-04-08 ENCOUNTER — Telehealth (INDEPENDENT_AMBULATORY_CARE_PROVIDER_SITE_OTHER): Payer: Medicare PPO | Admitting: Family Medicine

## 2020-04-08 ENCOUNTER — Ambulatory Visit (HOSPITAL_BASED_OUTPATIENT_CLINIC_OR_DEPARTMENT_OTHER)
Admission: RE | Admit: 2020-04-08 | Discharge: 2020-04-08 | Disposition: A | Discharge status: Home / Self Care | Payer: Medicare PPO | Source: Ambulatory Visit | Attending: Family Medicine | Admitting: Family Medicine

## 2020-04-08 ENCOUNTER — Encounter: Payer: Self-pay | Admitting: Family Medicine

## 2020-04-08 ENCOUNTER — Other Ambulatory Visit: Payer: Self-pay

## 2020-04-08 VITALS — BP 132/74 | HR 77 | Temp 98.6°F | Ht 72.0 in

## 2020-04-08 DIAGNOSIS — J40 Bronchitis, not specified as acute or chronic: Secondary | ICD-10-CM | POA: Insufficient documentation

## 2020-04-08 DIAGNOSIS — R059 Cough, unspecified: Secondary | ICD-10-CM | POA: Diagnosis not present

## 2020-04-08 MED ORDER — AZITHROMYCIN 250 MG PO TABS: ORAL_TABLET | ORAL | 0 refills | Status: DC

## 2020-04-08 NOTE — Assessment & Plan Note (Signed)
cxr and z pak con't mucinex  rto prn

## 2020-04-08 NOTE — Progress Notes (Signed)
MyChart Video Visit    Virtual Visit via Video Note   This visit type was conducted due to national recommendations for restrictions regarding the COVID-19 Pandemic (e.g. social distancing) in an effort to limit this patient's exposure and mitigate transmission in our community. This patient is at least at moderate risk for complications without adequate follow up. This format is felt to be most appropriate for this patient at this time. Physical exam was limited by quality of the video and audio technology used for the visit. Alinda Dooms was able to get the patient set up on a video visit.  Patient location: home alone  Patient and provider in visit Provider location: Office  I discussed the limitations of evaluation and management by telemedicine and the availability of in person appointments. The patient expressed understanding and agreed to proceed.  Visit Date: 04/08/2020  Today's healthcare provider: Ann Held, DO     Subjective:    Patient ID: Antonio Stephens, male    DOB: 02-Jul-1945, 75 y.o.   MRN: [id number]  Chief Complaint  Patient presents with  . Cough    Pt states sxs started last week. Pt went to [place]. Pt states having raspy voice. Pt states taking OTC Muncinex. No COVID test    HPI Patient is in today for raspy voice and cough since last week.  He can feel it draining in the back of his throat   Pt has been taking mucinex   Pt has been coughing up a Fayad mucus---  [place] Shark , yellow and green   Past Medical History:  Diagnosis Date  . Back pain   . 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 Depression   . Glaucoma    bilateral  . Hyperlipidemia   . Hypertension   . Urinary retention     Past Surgical History:  Procedure Laterality Date  . COLONOSCOPY    . HIP RESURFACING Right 11/04/2015  . inguinal Herniorrhaphy     left  . KNEE ARTHROSCOPY  04/2005    right  . KNEE SURGERY  09-2009   partial replacemente , R  . PARTIAL KNEE ARTHROPLASTY Left 10.10.16  . PROSTATE BIOPSY  02/2016  . TOTAL HIP ARTHROPLASTY  12/16/09   left hip  . 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     benign/fatty tumor    Family History  Problem Relation Age of Onset  . Alzheimer's disease Mother 81  . Cancer Father 50       sm cell carcinoma lung  . Cancer Sister 26       breast  . Breast cancer Sister   . Hyperlipidemia Paternal Grandfather   . Colon cancer Neg Hx   . Stomach cancer Neg Hx     Social History   Socioeconomic History  . Marital status: Married    Spouse name: Not on file  . Number of children: Not on file  . Years of education: Not on file  . Highest education level: Not on file  Occupational History  . Occupation: Teacher, early years/pre  Tobacco Use  . Smoking status: Former Smoker    Years: 0.20    Types: Cigarettes    Quit date: 09/15/1967    Years since quitting: 52.6  . Smokeless tobacco: Never Used  Substance and Sexual Activity  . Alcohol use: Yes    Alcohol/week: 7.0 - 10.0 standard drinks  Types: 7 - 10 Glasses of wine per week    Comment: sometimes nightly  . Drug use: No  . Sexual activity: Yes  Other Topics Concern  . Not on file  Social History Narrative  . Not on file   Social Determinants of Health   Financial Resource Strain: Not on file  Food Insecurity: Not on file  Transportation Needs: Not on file  Physical Activity: Not on file  Stress: Not on file  Social Connections: Not on file  Intimate Partner Violence: Not on file    Outpatient Medications Prior to Visit  Medication Sig Dispense Refill  . aspirin 81 MG tablet Take 81 mg by mouth daily.    . Calcium Carbonate-Vit D-Min (CALCIUM 1200 PO) Take 4 tablets by mouth daily. Raw calcium    . Cholecalciferol (VITAMIN D3) 2000 units TABS Take 1 capsule by mouth daily.    Marland Kitchen GLUCOSAMINE-CHONDROITIN-MSM PO Take 3,200 mg by mouth 2 (two) times daily.    Marland Kitchen lisinopril  (ZESTRIL) 20 MG tablet Take 1 tablet (20 mg total) by mouth daily. 90 tablet 1  . multivitamin (THERAGRAN) per tablet Take 1 tablet by mouth daily.    . Omega 3 1200 MG CAPS Take 1 capsule by mouth 2 (two) times daily.    . rosuvastatin (CRESTOR) 20 MG tablet TAKE 1 TABLET(20 MG) BY MOUTH DAILY 90 tablet 0  . sertraline (ZOLOFT) 100 MG tablet Take 100 mg by mouth daily.    . timolol (TIMOPTIC) 0.5 % ophthalmic solution Place 1 drop into the left eye 2 (two) times daily.     . TURMERIC PO Take 940 mg by mouth 2 (two) times daily.     No facility-administered medications prior to visit.    No Known Allergies  Review of Systems  Constitutional: Negative for chills and fever.  HENT: Positive for congestion and sore throat.   Respiratory: Positive for cough and sputum production.        Objective:    Physical Exam Vitals and nursing note reviewed.  Constitutional:      Appearance: He is well-developed.  Pulmonary:     Effort: Pulmonary effort is normal. No respiratory distress.     Breath sounds: Normal breath sounds. No wheezing or rales.  Chest:     Chest wall: No tenderness.  Neurological:     Mental Status: He is alert and oriented to person, place, and time.     BP 132/74   Pulse 77   Temp 98.6 F (37 C)   Ht 6' (1.829 m)   SpO2 96%   BMI 26.64 kg/m  Wt Readings from Last 3 Encounters:  03/21/20 196 lb 6.4 oz (89.1 kg)  06/14/19 190 lb (86.2 kg)  12/06/18 194 lb (88 kg)    Diabetic Foot Exam - Simple   No data filed    Lab Results  Component Value Date   WBC 6.3 03/21/2020   HGB 14.1 03/21/2020   HCT 41.5 03/21/2020   PLT 180.0 03/21/2020   GLUCOSE 85 03/21/2020   CHOL 149 03/21/2020   TRIG 87.0 03/21/2020   HDL 49.80 03/21/2020   LDLDIRECT 89.0 12/06/2018   LDLCALC 82 03/21/2020   ALT 16 03/21/2020   AST 22 03/21/2020   NA 140 03/21/2020   K 4.7 03/21/2020   CL 103 03/21/2020   CREATININE 0.98 03/21/2020   BUN 22 03/21/2020   CO2 29 03/21/2020    TSH 2.04 03/21/2020   PSA 0.31 03/21/2020  INR 0.91 07/29/2009   HGBA1C 5.6 12/06/2018   MICROALBUR 1.2 12/10/2009    Lab Results  Component Value Date   TSH 2.04 03/21/2020   Lab Results  Component Value Date   WBC 6.3 03/21/2020   HGB 14.1 03/21/2020   HCT 41.5 03/21/2020   MCV 93.4 03/21/2020   PLT 180.0 03/21/2020   Lab Results  Component Value Date   NA 140 03/21/2020   K 4.7 03/21/2020   CO2 29 03/21/2020   GLUCOSE 85 03/21/2020   BUN 22 03/21/2020   CREATININE 0.98 03/21/2020   BILITOT 1.2 03/21/2020   ALKPHOS 55 03/21/2020   AST 22 03/21/2020   ALT 16 03/21/2020   PROT 6.1 03/21/2020   ALBUMIN 4.2 03/21/2020   CALCIUM 9.4 03/21/2020   ANIONGAP 10 11/29/2015   GFR 74.98 03/21/2020   Lab Results  Component Value Date   CHOL 149 03/21/2020   Lab Results  Component Value Date   HDL 49.80 03/21/2020   Lab Results  Component Value Date   LDLCALC 82 03/21/2020   Lab Results  Component Value Date   TRIG 87.0 03/21/2020   Lab Results  Component Value Date   CHOLHDL 3 03/21/2020   Lab Results  Component Value Date   HGBA1C 5.6 12/06/2018       Assessment & Plan:   Problem List Items Addressed This Visit      Unprioritized   Bronchitis - Primary    cxr and z pak con't mucinex  rto prn       Relevant Medications   azithromycin (ZITHROMAX Z-PAK) 250 MG tablet   Other Relevant Orders   DG Chest 2 View (Completed)      I am having Antonio Stephens start on azithromycin. I am also having him maintain his multivitamin, sertraline, timolol, aspirin, Calcium Carbonate-Vit D-Min (CALCIUM 1200 PO), Vitamin D3, GLUCOSAMINE-CHONDROITIN-MSM PO, Omega 3, TURMERIC PO, rosuvastatin, and lisinopril.  Meds ordered this encounter  Medications  . azithromycin (ZITHROMAX Z-PAK) 250 MG tablet    Sig: As directed    Dispense:  6 each    Refill:  0    I discussed the assessment and treatment plan with the patient. The patient was provided an  opportunity to ask questions and all were answered. The patient agreed with the plan and demonstrated an understanding of the instructions.   The patient was advised to call back or seek an in-person evaluation if the symptoms worsen or if the condition fails to improve as anticipated.  Ann Held, DO [place] [facility] [phone] (phone) [phone] (fax)  [place]

## 2020-05-16 ENCOUNTER — Telehealth (INDEPENDENT_AMBULATORY_CARE_PROVIDER_SITE_OTHER): Payer: Medicare PPO | Admitting: Family Medicine

## 2020-05-16 ENCOUNTER — Other Ambulatory Visit: Payer: Self-pay

## 2020-05-16 ENCOUNTER — Encounter: Payer: Self-pay | Admitting: Family Medicine

## 2020-05-16 DIAGNOSIS — J40 Bronchitis, not specified as acute or chronic: Secondary | ICD-10-CM | POA: Diagnosis not present

## 2020-05-16 MED ORDER — AZITHROMYCIN 250 MG PO TABS: ORAL_TABLET | ORAL | 0 refills | Status: DC

## 2020-05-16 NOTE — Assessment & Plan Note (Signed)
z pak sent  con't mucinex Pt will get a covid test at the pharmacy

## 2020-05-16 NOTE — Progress Notes (Signed)
MyChart Video Visit    Virtual Visit via Video Note   This visit type was conducted due to national recommendations for restrictions regarding the COVID-19 Pandemic (e.g. social distancing) in an effort to limit this patient's exposure and mitigate transmission in our community. This patient is at least at moderate risk for complications without adequate follow up. This format is felt to be most appropriate for this patient at this time. Physical exam was limited by quality of the video and audio technology used for the visit. Alinda Dooms was able to get the patient set up on a video visit.  Patient location: home alone.  Patient and provider in visit Provider location: home   I discussed the limitations of evaluation and management by telemedicine and the availability of in person appointments. The patient expressed understanding and agreed to proceed.  Visit Date: 05/16/2020  Today's healthcare provider: Ann Held, DO     Subjective:    Patient ID: Antonio Stephens, male    DOB: Nov 28, 1945, 75 y.o.   MRN: [id number]  No chief complaint on file.   HPI Patient is in today for congestion , cough .  He is taking mucinex and advil pm  No fever   He has not taken a covid test and has had the vaccine.  No sob, no chest pain  Past Medical History:  Diagnosis Date  . Back pain   . 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 Depression   . Glaucoma    bilateral  . Hyperlipidemia   . Hypertension   . Urinary retention     Past Surgical History:  Procedure Laterality Date  . COLONOSCOPY    . HIP RESURFACING Right 11/04/2015  . inguinal Herniorrhaphy     left  . KNEE ARTHROSCOPY  04/2005   right  . KNEE SURGERY  09-2009   partial replacemente , R  . PARTIAL KNEE ARTHROPLASTY Left 10.10.16  . PROSTATE BIOPSY  02/2016  . TOTAL HIP ARTHROPLASTY  12/16/09   left hip  . Tumor on  neck as a child     benign/fatty tumor    Family History  Problem Relation Age of Onset  . Alzheimer's disease Mother 28  . Cancer Father 32       sm cell carcinoma lung  . Cancer Sister 64       breast  . Breast cancer Sister   . Hyperlipidemia Paternal Grandfather   . Colon cancer Neg Hx   . Stomach cancer Neg Hx     Social History   Socioeconomic History  . Marital status: Married    Spouse name: Not on file  . Number of children: Not on file  . Years of education: Not on file  . Highest education level: Not on file  Occupational History  . Occupation: Teacher, early years/pre  Tobacco Use  . Smoking status: Former Smoker    Years: 0.20    Types: Cigarettes    Quit date: 09/15/1967    Years since quitting: 52.7  . Smokeless tobacco: Never Used  Substance and Sexual Activity  . Alcohol use: Yes    Alcohol/week: 7.0 - 10.0 standard drinks    Types: 7 - 10 Glasses of wine per week    Comment: sometimes nightly  . Drug use: No  . Sexual activity: Yes  Other Topics Concern  . Not on file  Social  History Narrative  . Not on file   Social Determinants of Health   Financial Resource Strain: Not on file  Food Insecurity: Not on file  Transportation Needs: Not on file  Physical Activity: Not on file  Stress: Not on file  Social Connections: Not on file  Intimate Partner Violence: Not on file    Outpatient Medications Prior to Visit  Medication Sig Dispense Refill  . aspirin 81 MG tablet Take 81 mg by mouth daily.    Marland Kitchen azithromycin (ZITHROMAX Z-PAK) 250 MG tablet As directed 6 each 0  . Calcium Carbonate-Vit D-Min (CALCIUM 1200 PO) Take 4 tablets by mouth daily. Raw calcium    . Cholecalciferol (VITAMIN D3) 2000 units TABS Take 1 capsule by mouth daily.    Marland Kitchen GLUCOSAMINE-CHONDROITIN-MSM PO Take 3,200 mg by mouth 2 (two) times daily.    Marland Kitchen lisinopril (ZESTRIL) 20 MG tablet Take 1 tablet (20 mg total) by mouth daily. 90 tablet 1  . multivitamin (THERAGRAN) per tablet Take 1  tablet by mouth daily.    . Omega 3 1200 MG CAPS Take 1 capsule by mouth 2 (two) times daily.    . rosuvastatin (CRESTOR) 20 MG tablet TAKE 1 TABLET(20 MG) BY MOUTH DAILY 90 tablet 0  . sertraline (ZOLOFT) 100 MG tablet Take 100 mg by mouth daily.    . timolol (TIMOPTIC) 0.5 % ophthalmic solution Place 1 drop into the left eye 2 (two) times daily.     . TURMERIC PO Take 940 mg by mouth 2 (two) times daily.     No facility-administered medications prior to visit.    No Known Allergies  Review of Systems  Constitutional: Negative for fever and malaise/fatigue.  HENT: Negative for congestion.   Eyes: Negative for blurred vision.  Respiratory: Positive for cough. Negative for sputum production and shortness of breath.   Cardiovascular: Negative for chest pain, palpitations and leg swelling.  Gastrointestinal: Negative for abdominal pain, blood in stool and nausea.  Genitourinary: Negative for dysuria and frequency.  Musculoskeletal: Negative for falls.  Skin: Negative for rash.  Neurological: Negative for dizziness, loss of consciousness and headaches.  Endo/Heme/Allergies: Negative for environmental allergies.  Psychiatric/Behavioral: Negative for depression. The patient is not nervous/anxious.        Objective:    Physical Exam Vitals and nursing note reviewed.  Pulmonary:     Effort: Pulmonary effort is normal.  Psychiatric:        Mood and Affect: Mood normal.        Behavior: Behavior normal.        Thought Content: Thought content normal.        Judgment: Judgment normal.     There were no vitals taken for this visit. Wt Readings from Last 3 Encounters:  03/21/20 196 lb 6.4 oz (89.1 kg)  06/14/19 190 lb (86.2 kg)  12/06/18 194 lb (88 kg)    Diabetic Foot Exam - Simple   No data filed    Lab Results  Component Value Date   WBC 6.3 03/21/2020   HGB 14.1 03/21/2020   HCT 41.5 03/21/2020   PLT 180.0 03/21/2020   GLUCOSE 85 03/21/2020   CHOL 149 03/21/2020    TRIG 87.0 03/21/2020   HDL 49.80 03/21/2020   LDLDIRECT 89.0 12/06/2018   LDLCALC 82 03/21/2020   ALT 16 03/21/2020   AST 22 03/21/2020   NA 140 03/21/2020   K 4.7 03/21/2020   CL 103 03/21/2020   CREATININE 0.98 03/21/2020  BUN 22 03/21/2020   CO2 29 03/21/2020   TSH 2.04 03/21/2020   PSA 0.31 03/21/2020   INR 0.91 07/29/2009   HGBA1C 5.6 12/06/2018   MICROALBUR 1.2 12/10/2009    Lab Results  Component Value Date   TSH 2.04 03/21/2020   Lab Results  Component Value Date   WBC 6.3 03/21/2020   HGB 14.1 03/21/2020   HCT 41.5 03/21/2020   MCV 93.4 03/21/2020   PLT 180.0 03/21/2020   Lab Results  Component Value Date   NA 140 03/21/2020   K 4.7 03/21/2020   CO2 29 03/21/2020   GLUCOSE 85 03/21/2020   BUN 22 03/21/2020   CREATININE 0.98 03/21/2020   BILITOT 1.2 03/21/2020   ALKPHOS 55 03/21/2020   AST 22 03/21/2020   ALT 16 03/21/2020   PROT 6.1 03/21/2020   ALBUMIN 4.2 03/21/2020   CALCIUM 9.4 03/21/2020   ANIONGAP 10 11/29/2015   GFR 74.98 03/21/2020   Lab Results  Component Value Date   CHOL 149 03/21/2020   Lab Results  Component Value Date   HDL 49.80 03/21/2020   Lab Results  Component Value Date   LDLCALC 82 03/21/2020   Lab Results  Component Value Date   TRIG 87.0 03/21/2020   Lab Results  Component Value Date   CHOLHDL 3 03/21/2020   Lab Results  Component Value Date   HGBA1C 5.6 12/06/2018       Assessment & Plan:   Problem List Items Addressed This Visit      Unprioritized   Bronchitis - Primary    z pak sent  con't mucinex Pt will get a covid test at the pharmacy      Relevant Medications   azithromycin (ZITHROMAX Z-PAK) 250 MG tablet      I am having Antonio Stephens start on azithromycin. I am also having him maintain his multivitamin, sertraline, timolol, aspirin, Calcium Carbonate-Vit D-Min (CALCIUM 1200 PO), Vitamin D3, GLUCOSAMINE-CHONDROITIN-MSM PO, Omega 3, TURMERIC PO, rosuvastatin, lisinopril, and  azithromycin.  Meds ordered this encounter  Medications  . azithromycin (ZITHROMAX Z-PAK) 250 MG tablet    Sig: As directed    Dispense:  6 each    Refill:  0    I discussed the assessment and treatment plan with the patient. The patient was provided an opportunity to ask questions and all were answered. The patient agreed with the plan and demonstrated an understanding of the instructions.   The patient was advised to call back or seek an in-person evaluation if the symptoms worsen or if the condition fails to improve as anticipated.     Ann Held, DO [place] [facility] [phone] (phone) [phone] (fax)  [place]

## 2020-05-20 ENCOUNTER — Other Ambulatory Visit: Payer: Self-pay | Admitting: Family Medicine

## 2020-06-06 ENCOUNTER — Other Ambulatory Visit: Payer: Self-pay

## 2020-06-06 ENCOUNTER — Encounter: Payer: Self-pay | Admitting: Family Medicine

## 2020-06-06 ENCOUNTER — Telehealth (INDEPENDENT_AMBULATORY_CARE_PROVIDER_SITE_OTHER): Payer: Medicare PPO | Admitting: Family Medicine

## 2020-06-06 DIAGNOSIS — U071 COVID-19: Secondary | ICD-10-CM | POA: Insufficient documentation

## 2020-06-06 MED ORDER — NIRMATRELVIR/RITONAVIR (PAXLOVID)TABLET: 3.0000 | ORAL_TABLET | Freq: Two times a day (BID) | ORAL | 0 refills | Status: AC

## 2020-06-06 NOTE — Assessment & Plan Note (Signed)
paxlovid con't mucinex Call back if symptoms do not improve or worsen Quarantine for 5 days and then may leave house with mask on for 5 more days

## 2020-06-06 NOTE — Progress Notes (Signed)
MyChart Video Visit    Virtual Visit via Video Note   This visit type was conducted due to national recommendations for restrictions regarding the COVID-19 Pandemic (e.g. social distancing) in an effort to limit this patient's exposure and mitigate transmission in our community. This patient is at least at moderate risk for complications without adequate follow up. This format is felt to be most appropriate for this patient at this time. Physical exam was limited by quality of the video and audio technology used for the visit. Alinda Dooms was able to get the patient set up on a video visit.  Patient location: Home Patient and provider in visit Provider location: Office  I discussed the limitations of evaluation and management by telemedicine and the availability of in person appointments. The patient expressed understanding and agreed to proceed.  Visit Date: 06/06/2020  Today's healthcare provider: Ann Held, DO     Subjective:    Patient ID: Antonio Stephens, male    DOB: 31-Jul-1945, 75 y.o.   MRN: [id number]  Chief Complaint  Patient presents with  . Covid Positive    HPI Patient is in today for a video visit. + covid on Tuesday  Pt is coughing , taking mucinex and cough is keeping him awake no fevers  No sob, no chest pain   Past Medical History:  Diagnosis Date  . Back pain   . 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 Depression   . Glaucoma    bilateral  . Hyperlipidemia   . Hypertension   . Urinary retention     Past Surgical History:  Procedure Laterality Date  . COLONOSCOPY    . HIP RESURFACING Right 11/04/2015  . inguinal Herniorrhaphy     left  . KNEE ARTHROSCOPY  04/2005   right  . KNEE SURGERY  09-2009   partial replacemente , R  . PARTIAL KNEE ARTHROPLASTY Left 10.10.16  . PROSTATE BIOPSY  02/2016  . TOTAL HIP ARTHROPLASTY  12/16/09   left hip   . 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     benign/fatty tumor    Family History  Problem Relation Age of Onset  . Alzheimer's disease Mother 20  . Cancer Father 10       sm cell carcinoma lung  . Cancer Sister 4       breast  . Breast cancer Sister   . Hyperlipidemia Paternal Grandfather   . Colon cancer Neg Hx   . Stomach cancer Neg Hx     Social History   Socioeconomic History  . Marital status: Married    Spouse name: Not on file  . Number of children: Not on file  . Years of education: Not on file  . Highest education level: Not on file  Occupational History  . Occupation: Teacher, early years/pre  Tobacco Use  . Smoking status: Former Smoker    Years: 0.20    Types: Cigarettes    Quit date: 09/15/1967    Years since quitting: 52.7  . Smokeless tobacco: Never Used  Substance and Sexual Activity  . Alcohol use: Yes    Alcohol/week: 7.0 - 10.0 standard drinks    Types: 7 - 10 Glasses of wine per week    Comment: sometimes nightly  . Drug use: No  . Sexual activity: Yes  Other Topics Concern  . Not on file  Social History Narrative  .  Not on file   Social Determinants of Health   Financial Resource Strain: Not on file  Food Insecurity: Not on file  Transportation Needs: Not on file  Physical Activity: Not on file  Stress: Not on file  Social Connections: Not on file  Intimate Partner Violence: Not on file    Outpatient Medications Prior to Visit  Medication Sig Dispense Refill  . aspirin 81 MG tablet Take 81 mg by mouth daily.    . Calcium Carbonate-Vit D-Min (CALCIUM 1200 PO) Take 4 tablets by mouth daily. Raw calcium    . Cholecalciferol (VITAMIN D3) 2000 units TABS Take 1 capsule by mouth daily.    Marland Kitchen GLUCOSAMINE-CHONDROITIN-MSM PO Take 3,200 mg by mouth 2 (two) times daily.    Marland Kitchen lisinopril (ZESTRIL) 20 MG tablet Take 1 tablet (20 mg total) by mouth daily. 90 tablet 1  . multivitamin (THERAGRAN) per tablet Take 1 tablet by mouth daily.    . Omega 3 1200 MG CAPS Take 1  capsule by mouth 2 (two) times daily.    . rosuvastatin (CRESTOR) 20 MG tablet Take 1 tablet (20 mg total) by mouth daily. 90 tablet 1  . sertraline (ZOLOFT) 100 MG tablet Take 100 mg by mouth daily.    . timolol (TIMOPTIC) 0.5 % ophthalmic solution Place 1 drop into the left eye 2 (two) times daily.     . TURMERIC PO Take 940 mg by mouth 2 (two) times daily.    Marland Kitchen azithromycin (ZITHROMAX Z-PAK) 250 MG tablet As directed (Patient not taking: Reported on 06/06/2020) 6 each 0  . azithromycin (ZITHROMAX Z-PAK) 250 MG tablet As directed (Patient not taking: Reported on 06/06/2020) 6 each 0   No facility-administered medications prior to visit.    No Known Allergies  Review of Systems  Constitutional: Negative for fever and malaise/fatigue.  HENT: Negative for congestion.   Eyes: Negative for blurred vision.  Respiratory: Positive for cough. Negative for shortness of breath.   Cardiovascular: Negative for chest pain, palpitations and leg swelling.  Gastrointestinal: Negative for vomiting.  Musculoskeletal: Negative for back pain.  Skin: Negative for rash.  Neurological: Negative for loss of consciousness and headaches.       Objective:    Physical Exam Vitals and nursing note reviewed.  Constitutional:      Appearance: Normal appearance.  Pulmonary:     Effort: Pulmonary effort is normal.  Neurological:     Mental Status: He is alert.  Psychiatric:        Mood and Affect: Mood normal.        Behavior: Behavior normal.        Thought Content: Thought content normal.        Judgment: Judgment normal.     There were no vitals taken for this visit. Wt Readings from Last 3 Encounters:  03/21/20 196 lb 6.4 oz (89.1 kg)  06/14/19 190 lb (86.2 kg)  12/06/18 194 lb (88 kg)    Diabetic Foot Exam - Simple   No data filed    Lab Results  Component Value Date   WBC 6.3 03/21/2020   HGB 14.1 03/21/2020   HCT 41.5 03/21/2020   PLT 180.0 03/21/2020   GLUCOSE 85 03/21/2020   CHOL  149 03/21/2020   TRIG 87.0 03/21/2020   HDL 49.80 03/21/2020   LDLDIRECT 89.0 12/06/2018   LDLCALC 82 03/21/2020   ALT 16 03/21/2020   AST 22 03/21/2020   NA 140 03/21/2020   K 4.7 03/21/2020  CL 103 03/21/2020   CREATININE 0.98 03/21/2020   BUN 22 03/21/2020   CO2 29 03/21/2020   TSH 2.04 03/21/2020   PSA 0.31 03/21/2020   INR 0.91 07/29/2009   HGBA1C 5.6 12/06/2018   MICROALBUR 1.2 12/10/2009    Lab Results  Component Value Date   TSH 2.04 03/21/2020   Lab Results  Component Value Date   WBC 6.3 03/21/2020   HGB 14.1 03/21/2020   HCT 41.5 03/21/2020   MCV 93.4 03/21/2020   PLT 180.0 03/21/2020   Lab Results  Component Value Date   NA 140 03/21/2020   K 4.7 03/21/2020   CO2 29 03/21/2020   GLUCOSE 85 03/21/2020   BUN 22 03/21/2020   CREATININE 0.98 03/21/2020   BILITOT 1.2 03/21/2020   ALKPHOS 55 03/21/2020   AST 22 03/21/2020   ALT 16 03/21/2020   PROT 6.1 03/21/2020   ALBUMIN 4.2 03/21/2020   CALCIUM 9.4 03/21/2020   ANIONGAP 10 11/29/2015   GFR 74.98 03/21/2020   Lab Results  Component Value Date   CHOL 149 03/21/2020   Lab Results  Component Value Date   HDL 49.80 03/21/2020   Lab Results  Component Value Date   LDLCALC 82 03/21/2020   Lab Results  Component Value Date   TRIG 87.0 03/21/2020   Lab Results  Component Value Date   CHOLHDL 3 03/21/2020   Lab Results  Component Value Date   HGBA1C 5.6 12/06/2018       Assessment & Plan:   Problem List Items Addressed This Visit      Unprioritized   COVID-19 - Primary    paxlovid con't mucinex Call back if symptoms do not improve or worsen Quarantine for 5 days and then may leave house with mask on for 5 more days      Relevant Medications   nirmatrelvir/ritonavir EUA (PAXLOVID) TABS       Meds ordered this encounter  Medications  . nirmatrelvir/ritonavir EUA (PAXLOVID) TABS    Sig: Take 3 tablets by mouth 2 (two) times daily for 5 days. (Take nirmatrelvir 150 mg two  tablets twice daily for 5 days and ritonavir 100 mg one tablet twice daily for 5 days) Patient GFR is 74.98    Dispense:  30 tablet    Refill:  0    I discussed the assessment and treatment plan with the patient. The patient was provided an opportunity to ask questions and all were answered. The patient agreed with the plan and demonstrated an understanding of the instructions.   The patient was advised to call back or seek an in-person evaluation if the symptoms worsen or if the condition fails to improve as anticipated.  I provided 20 minutes of face-to-face time during this encounter.   Ann Held, DO [place] [facility] [phone] (phone) [phone] (fax)  [place]

## 2020-06-10 ENCOUNTER — Encounter: Payer: Self-pay | Admitting: Family Medicine

## 2020-06-10 ENCOUNTER — Telehealth: Payer: Self-pay | Admitting: Family Medicine

## 2020-06-10 ENCOUNTER — Other Ambulatory Visit: Payer: Self-pay | Admitting: Family Medicine

## 2020-06-10 DIAGNOSIS — R059 Cough, unspecified: Secondary | ICD-10-CM

## 2020-06-10 MED ORDER — PROMETHAZINE-DM 6.25-15 MG/5ML PO SYRP: 5.0000 mL | ORAL_SOLUTION | Freq: Four times a day (QID) | ORAL | 0 refills | Status: DC | PRN

## 2020-06-10 NOTE — Telephone Encounter (Signed)
Weird?  Can he elaborate more--- chest pain? Then yes stop---  but he should only have 1 or 2 days left ?

## 2020-06-10 NOTE — Telephone Encounter (Signed)
Phenergan dm sent in --- at I think he meant cough medicine?

## 2020-06-10 NOTE — Telephone Encounter (Signed)
The patient states he was prescribed paxlovid for COVID. He is feeling "weird," which is hard to explain. The patient would like to know if he should continue taking the medication.

## 2020-06-10 NOTE — Telephone Encounter (Signed)
Would you recommend the pt continue the medication?

## 2020-06-10 NOTE — Telephone Encounter (Signed)
See My chart message

## 2020-06-21 ENCOUNTER — Other Ambulatory Visit: Payer: Self-pay

## 2020-06-21 ENCOUNTER — Encounter: Payer: Self-pay | Admitting: Family Medicine

## 2020-06-21 ENCOUNTER — Ambulatory Visit: Payer: Medicare PPO | Admitting: Family Medicine

## 2020-06-21 VITALS — BP 122/70 | HR 78 | Temp 98.0°F | Resp 18 | Ht 73.0 in | Wt 195.6 lb

## 2020-06-21 DIAGNOSIS — D229 Melanocytic nevi, unspecified: Secondary | ICD-10-CM | POA: Diagnosis not present

## 2020-06-21 DIAGNOSIS — W57XXXA Bitten or stung by nonvenomous insect and other nonvenomous arthropods, initial encounter: Secondary | ICD-10-CM

## 2020-06-21 DIAGNOSIS — S20162A Insect bite (nonvenomous) of breast, left breast, initial encounter: Secondary | ICD-10-CM | POA: Diagnosis not present

## 2020-06-21 MED ORDER — DOXYCYCLINE HYCLATE 100 MG PO TABS: 100.0000 mg | ORAL_TABLET | Freq: Two times a day (BID) | ORAL | 0 refills | Status: DC

## 2020-06-21 NOTE — Assessment & Plan Note (Signed)
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

## 2020-06-21 NOTE — Patient Instructions (Signed)
Tick Bite Information, Adult Ticks are insects that draw blood for food. Most ticks live in shrubs and grassy and wooded areas. They climb onto people and animals that brush against the leaves and grasses that they rest on. Then they bite, attaching themselves to the skin. Most ticks are harmless, but some ticks may carry germs that can spread to a person through a bite and cause a disease. To reduce your risk of getting a disease from a tick bite, make sure you: Take steps to prevent tick bites. Check for ticks after being outdoors where ticks live. Watch for symptoms of disease if a tick attached to you or if you suspect a tick bite. How can I prevent tick bites? Take these steps to help prevent tick bites when you go outdoors in an areawhere ticks live: Use insect repellent Use insect repellent that has DEET (20% or higher), picaridin, or IR3535 in it. Follow the instructions on the label. Use these products on: Bare skin. The top of your boots. Your pant legs. Your sleeve cuffs. For insect repellent that contains permethrin, follow the instructions on the label. Use these products on: Clothing. Boots. Outdoor gear. Tents. When you are outside Wear protective clothing. Long sleeves and long pants offer the best protection from ticks. Wear light-colored clothing so you can see ticks more easily. Tuck your pant legs into your socks. If you go walking on a trail, stay in the middle of the trail so your skin, hair, and clothing do not touch the bushes. Avoid walking through areas with long grass. Check for ticks on your clothing, hair, and skin often while you are outside, and check again before you go inside. Make sure to check the scalp, neck, armpits, waist, groin, and joint areas. These are the spots where ticks attach themselves most often. When you go indoors Check your clothing for ticks. Tumble dry clothes in a dryer on high heat for at least 10 minutes. If clothes are damp,  additional time may be needed. If clothes require washing, use hot water. Examine gear and pets. Shower soon after being outdoors. Check your body for ticks. Conduct a full body check using a mirror. What is the proper way to remove a tick? If you find a tick on your body, remove it as soon as possible. Removing a tick sooner can prevent germs from passing to your body. Do not remove the tick with your bare fingers. To remove a tick that is crawling on your skin but has not bitten, use either of these methods: Go outdoors and brush the tick off. Remove the tick with tape or a lint roller. To remove a tick that is attached to your skin: Wash your hands. If you have latex gloves, put them on. Use fine-tipped tweezers, curved forceps, or a tick-removal tool to gently grasp the tick as close to your skin and the tick's head as possible. Gently pull with a steady, upward, even pressure until the tick lets go. When removing the tick: Take care to keep the tick's head attached to its body. Do not twist or jerk the tick. This can make the tick's head or mouth parts break off and remain in the skin. Do not squeeze or crush the tick's body. This could force disease-carrying fluids from the tick into your body. Do not try to remove a tick with heat, alcohol, petroleum jelly, or fingernail polish. Using these methods can cause the tick to salivate and regurgitate intoyour bloodstream, increasing your  risk of getting a disease. What should I do after removing a tick? Dispose of the tick. Do not crush a tick with your fingers. Clean the bite area and your hands with soap and water, rubbing alcohol, or an iodine scrub. If an antiseptic cream or ointment is available, apply a small amount to the bite site. Wash and disinfect any instruments that you used to remove the tick. How should I dispose of a tick? To dispose of a live tick, use one of these methods: Place it in rubbing alcohol. Place it in a sealed  bag or container. Wrap it tightly in tape. Flush it down the toilet. Contact a health care provider if: You have symptoms of a disease after a tick bite. Symptoms of a tick-borne disease can occur from moments after the tick bites to 30 days after a tick is removed. Symptoms include: Fever or chills. Any of these signs in the bite area: A red rash that makes a circle (bull's-eye rash) in the bite area. Redness and swelling. Headache. Muscle, joint, or bone pain. Abnormal tiredness. Numbness in your legs or difficulty walking or moving your legs. Tender, swollen lymph glands. A part of a tick breaks off and gets stuck in your skin. Get help right away if: You are not able to remove a tick. You experience muscle weakness or paralysis. Your symptoms get worse or you experience new symptoms. You find an engorged tick on your skin and you are in an area where disease from ticks is a high risk. Summary Ticks may carry germs that can spread to a person through a bite and cause a disease. Wear protective clothing and use insect repellent to prevent tick bites. Follow the instructions on the label. If you find a tick on your body, remove it as soon as possible. If the tick is attached, do not try to remove with heat, alcohol, petroleum jelly, or fingernail polish. Remove the attached tick using fine-tipped tweezers, curved forceps, or a tick-removal tool. Gently pull with steady, upward, even pressure until the tick lets go. Do not twist or jerk the tick. Do not squeeze or crush the tick's body. If you have symptoms of a disease after being bitten by a tick, contact a health care provider. This information is not intended to replace advice given to you by your health care provider. Make sure you discuss any questions you have with your healthcare provider. Document Revised: 12/19/2018 Document Reviewed: 12/19/2018 Elsevier Patient Education  [place].

## 2020-06-21 NOTE — Progress Notes (Signed)
Patient ID: Antonio Stephens, male    DOB: 08/06/1945  Age: 75 y.o. MRN: [id number]    Subjective:  Subjective  HPI Antonio Stephens presents for an office visit today. He complains of tick bite on his left side of chest x2 days. He notes that the area itches. He also complains of skin growth on his right foot x3 months. He states that the area scabs and had not healed. He reports that he had not used any OTC medication or cream. He denies any chest pain, SOB, fever, abdominal pain, cough, chills, sore throat, dysuria, urinary incontinence, back pain, HA, or N/V/D at this time.  He first thought the area on his Low left leg was a tick bite but he's not sure   Review of Systems  Constitutional:  Negative for chills, fatigue and fever.  HENT:  Negative for ear pain, rhinorrhea, sinus pressure, sinus pain, sore throat and tinnitus.   Eyes:  Negative for pain.  Respiratory:  Negative for cough, shortness of breath and wheezing.   Cardiovascular:  Negative for chest pain.  Gastrointestinal:  Negative for abdominal pain, anal bleeding, constipation, diarrhea, nausea and vomiting.  Genitourinary:  Negative for flank pain.  Musculoskeletal:  Negative for back pain and neck pain.  Skin:  Negative for rash.       (+) tick bite (+) skin growth   Neurological:  Negative for seizures, weakness, light-headedness, numbness and headaches.   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   His family history includes Alzheimer's disease (age of onset: 39) in his mother; Breast cancer in his sister; Cancer (age of onset: 88) in his sister; Cancer (age of onset: 67) in his father; Hyperlipidemia in his paternal grandfather.He reports that he quit smoking about 52 years ago. His smoking use included cigarettes. He has never used smokeless tobacco. He reports current alcohol use of about 7.0 - 10.0 standard drinks of alcohol per week. He reports that he does not use drugs.  Current Outpatient Medications on File Prior to Visit  Medication Sig Dispense Refill   aspirin 81 MG tablet Take 81 mg by mouth daily.     Calcium Carbonate-Vit D-Min (CALCIUM 1200 PO) Take 4 tablets by mouth daily. Raw calcium     Cholecalciferol (VITAMIN D3) 2000 units TABS Take 1 capsule by mouth daily.     GLUCOSAMINE-CHONDROITIN-MSM PO Take 3,200 mg by mouth 2 (two) times daily.     lisinopril (ZESTRIL) 20 MG tablet Take 1 tablet (20 mg total) by mouth daily. 90 tablet 1   multivitamin (THERAGRAN) per tablet Take 1 tablet by mouth daily.     Omega 3 1200 MG CAPS Take 1 capsule by mouth 2 (two) times daily.     promethazine-dextromethorphan (PROMETHAZINE-DM) 6.25-15 MG/5ML syrup Take 5 mLs by mouth 4 (four) times daily as needed. 118 mL 0   rosuvastatin (CRESTOR) 20 MG tablet Take 1 tablet (20 mg total) by mouth daily.  90 tablet 1   sertraline (ZOLOFT) 100 MG tablet Take 100 mg by mouth daily.     timolol (TIMOPTIC) 0.5 % ophthalmic solution Place 1 drop into the left eye 2 (two) times daily.      TURMERIC PO Take 940 mg by mouth 2 (two) times daily.     No current facility-administered medications on file prior to visit.     Objective:  Objective  Physical Exam Vitals and nursing note reviewed.  Constitutional:      General: He is not in acute distress.    Appearance: Normal appearance. He is well-developed. He is not ill-appearing.  HENT:     Head: Normocephalic and atraumatic.      Right Ear: External ear normal.     Left Ear: External ear normal.     Nose: Nose normal.  Eyes:     General:        Right eye: No discharge.        Left eye: No discharge.     Extraocular Movements: Extraocular movements intact.     Pupils: Pupils are equal, round, and reactive to light.  Cardiovascular:     Rate and Rhythm: Normal rate and regular rhythm.     Pulses: Normal pulses.     Heart sounds: Normal heart sounds. No murmur heard.   No friction rub. No gallop.  Pulmonary:     Effort: Pulmonary effort is normal. No respiratory distress.     Breath sounds: Normal breath sounds. No stridor. No wheezing, rhonchi or rales.  Chest:     Chest wall: No tenderness.  Abdominal:     General: Bowel sounds are normal. There is no distension.     Palpations: Abdomen is soft. There is no mass.     Tenderness: There is no abdominal tenderness. There is no guarding or rebound.     Hernia: No hernia is present.  Musculoskeletal:        General: Normal range of motion.     Cervical back: Normal range of motion and neck supple.     Right lower leg: No edema.     Left lower leg: No edema.  Skin:    General: Skin is warm and dry.     Findings: Erythema and lesion present.     Comments: There is erythema present in the L breast. There is rough, scaly, and round skin lesion 3/4 inches in diameter present in the anterior region of the R foot.   Neurological:     Mental Status: He is alert and oriented to person, place, and time.  Psychiatric:        Behavior: Behavior normal.        Thought Content: Thought content normal.   BP 122/70 (BP Location: Left Arm, Patient Position: Sitting, Cuff Size: Normal)   Pulse 78   Temp 98 F (36.7 C)   Resp 18   Ht 6\' 1"  (1.854 m)   Wt 195 lb 9.6 oz (88.7 kg)   SpO2 98%   BMI 25.81 kg/m  Wt Readings from Last 3 Encounters:  06/21/20 195 lb 9.6 oz (88.7 kg)  03/21/20 196 lb 6.4 oz (89.1 kg)  06/14/19 190 lb (86.2 kg)     Lab Results   Component Value Date   WBC 6.3 03/21/2020   HGB 14.1 03/21/2020   HCT 41.5 03/21/2020   PLT 180.0 03/21/2020   GLUCOSE 85 03/21/2020   CHOL 149 03/21/2020   TRIG 87.0 03/21/2020  HDL 49.80 03/21/2020   LDLDIRECT 89.0 12/06/2018   LDLCALC 82 03/21/2020   ALT 16 03/21/2020   AST 22 03/21/2020   NA 140 03/21/2020   K 4.7 03/21/2020   CL 103 03/21/2020   CREATININE 0.98 03/21/2020   BUN 22 03/21/2020   CO2 29 03/21/2020   TSH 2.04 03/21/2020   PSA 0.31 03/21/2020   INR 0.91 07/29/2009   HGBA1C 5.6 12/06/2018   MICROALBUR 1.2 12/10/2009    DG Chest 2 View  Result Date: 04/08/2020 CLINICAL DATA:  Cough EXAM: CHEST - 2 VIEW COMPARISON:  September 24, 2015 FINDINGS: The heart size and mediastinal contours are within normal limits. Both lungs are clear. The visualized skeletal structures are unremarkable. IMPRESSION: No active cardiopulmonary disease. Electronically Signed   By: Prudencio Pair M.D.   On: 04/08/2020 15:43     Assessment & Plan:  Plan   Meds ordered this encounter  Medications   doxycycline (VIBRA-TABS) 100 MG tablet    Sig: Take 1 tablet (100 mg total) by mouth 2 (two) times daily.    Dispense:  20 tablet    Refill:  0    Problem List Items Addressed This Visit   None Visit Diagnoses     Tick bite of left male breast, initial encounter    -  Primary   Relevant Medications   doxycycline (VIBRA-TABS) 100 MG tablet   Other Relevant Orders   Rocky mtn spotted fvr ab, IgM-blood   Lyme Disease Serology w/Reflex       Follow-up: Return if symptoms worsen or fail to improve.   I,Gordon Zheng,acting as a Education administrator for [employer], DO.,have documented all relevant documentation on the behalf of Ann Held, DO,as directed by  Ann Held, DO while in the presence of [place], DO, have reviewed all documentation for this visit. The documentation on 06/21/20 for the exam, diagnosis, procedures,  and orders are all accurate and complete.

## 2020-06-24 LAB — ROCKY MTN SPOTTED FVR AB, IGM-BLOOD: RMSF IgM: 0.49 index (ref 0.00–0.89)

## 2020-06-24 LAB — LYME DISEASE SEROLOGY W/REFLEX: Lyme Total Antibody EIA: NEGATIVE

## 2020-07-10 ENCOUNTER — Encounter: Payer: Self-pay | Admitting: Family Medicine

## 2020-07-10 DIAGNOSIS — W57XXXA Bitten or stung by nonvenomous insect and other nonvenomous arthropods, initial encounter: Secondary | ICD-10-CM

## 2020-07-16 ENCOUNTER — Telehealth: Payer: Self-pay | Admitting: *Deleted

## 2020-07-16 DIAGNOSIS — W57XXXA Bitten or stung by nonvenomous insect and other nonvenomous arthropods, initial encounter: Secondary | ICD-10-CM

## 2020-07-16 NOTE — Telephone Encounter (Signed)
Lyme disease order placed per 07/10/20 mychart message with PCP.

## 2020-07-17 ENCOUNTER — Other Ambulatory Visit: Payer: Self-pay

## 2020-07-17 ENCOUNTER — Other Ambulatory Visit (INDEPENDENT_AMBULATORY_CARE_PROVIDER_SITE_OTHER): Payer: Medicare PPO

## 2020-07-17 DIAGNOSIS — S20162A Insect bite (nonvenomous) of breast, left breast, initial encounter: Secondary | ICD-10-CM | POA: Diagnosis not present

## 2020-07-17 DIAGNOSIS — W57XXXA Bitten or stung by nonvenomous insect and other nonvenomous arthropods, initial encounter: Secondary | ICD-10-CM | POA: Diagnosis not present

## 2020-07-17 DIAGNOSIS — D229 Melanocytic nevi, unspecified: Secondary | ICD-10-CM | POA: Diagnosis not present

## 2020-07-17 NOTE — Addendum Note (Signed)
Addended by: Manuela Schwartz on: 07/17/2020 01:47 PM   Modules accepted: Orders

## 2020-07-17 NOTE — Addendum Note (Signed)
Addended by: Kelle Darting A on: 07/17/2020 03:12 PM   Modules accepted: Orders

## 2020-07-17 NOTE — Addendum Note (Signed)
Addended by: Manuela Schwartz on: 07/17/2020 01:50 PM   Modules accepted: Orders

## 2020-07-18 LAB — ROCKY MTN SPOTTED FVR ABS PNL(IGG+IGM)
RMSF IgG: NOT DETECTED
RMSF IgM: NOT DETECTED

## 2020-07-18 LAB — B. BURGDORFI ANTIBODIES: B burgdorferi Ab IgG+IgM: <0.9 index

## 2020-07-25 ENCOUNTER — Telehealth: Payer: Self-pay | Admitting: Family Medicine

## 2020-07-25 ENCOUNTER — Other Ambulatory Visit: Payer: Self-pay | Admitting: Family Medicine

## 2020-07-25 DIAGNOSIS — D229 Melanocytic nevi, unspecified: Secondary | ICD-10-CM

## 2020-07-25 NOTE — Telephone Encounter (Signed)
Spoke w/ Pt's wife Gwyndolyn Saxon- informed referral has been placed.

## 2020-07-25 NOTE — Telephone Encounter (Signed)
Please advise- dx for change in mole.

## 2020-07-25 NOTE — Telephone Encounter (Signed)
Patient is requesting for referral to [facility] to see Dr. Harriett Sine

## 2020-08-14 ENCOUNTER — Telehealth: Payer: Self-pay

## 2020-08-14 NOTE — Telephone Encounter (Signed)
Pt called in needed an appointment for ear ache but the pt has not tested. Pt will test and call back in

## 2020-08-15 ENCOUNTER — Ambulatory Visit: Payer: Medicare PPO | Admitting: Internal Medicine

## 2020-08-15 ENCOUNTER — Encounter: Payer: Self-pay | Admitting: Internal Medicine

## 2020-08-15 ENCOUNTER — Other Ambulatory Visit: Payer: Self-pay

## 2020-08-15 VITALS — BP 126/80 | HR 89 | Temp 98.2°F | Resp 18 | Ht 73.0 in | Wt 190.2 lb

## 2020-08-15 DIAGNOSIS — R059 Cough, unspecified: Secondary | ICD-10-CM

## 2020-08-15 DIAGNOSIS — H9202 Otalgia, left ear: Secondary | ICD-10-CM | POA: Diagnosis not present

## 2020-08-15 MED ORDER — AMOXICILLIN 875 MG PO TABS: 875.0000 mg | ORAL_TABLET | Freq: Two times a day (BID) | ORAL | 0 refills | Status: DC

## 2020-08-15 MED ORDER — NEOMYCIN-POLYMYXIN-HC 1 % OT SOLN: 3.0000 [drp] | Freq: Three times a day (TID) | OTIC | 0 refills | Status: DC

## 2020-08-15 NOTE — Patient Instructions (Signed)
For the ER infection: Eardrops as prescribed for 5 to 6 days Amoxicillin for 5 days   For the cough: Over-the-counter Flonase: 2 sprays on each side of the nose every morning Over-the-counter Astepro: 2 sprays on each side of the nose every night.   Call if you are not gradually better

## 2020-08-15 NOTE — Progress Notes (Signed)
Subjective:    Patient ID: Antonio Stephens, male    DOB: January 15, 1945, 75 y.o.   MRN: [id number]  DOS:  08/15/2020 Type of visit - description: Acute He seems to have 2 separate issues:  Since March 2022 has on and off  "colds" characterized by cough, throat congestion, postnasal dripping. Denies any itchy eyes or itchy nose. + Sneezing on and off.  4 days ago developed another cold, this time associated with left ear pain, last night he felt some fluid from the ear but did not actually see any discharge. He denies recent fever chills. Reports he had a negative COVID test yesterday.   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   Depression    Glaucoma    bilateral   Hyperlipidemia    Hypertension    Urinary retention     Past Surgical History:  Procedure Laterality Date   COLONOSCOPY     HIP RESURFACING Right 11/04/2015   inguinal Herniorrhaphy     left   KNEE ARTHROSCOPY  04/2005   right   KNEE SURGERY  09-2009   partial replacemente , R   PARTIAL KNEE ARTHROPLASTY Left 10.10.16   PROSTATE BIOPSY  02/2016   TOTAL HIP ARTHROPLASTY  12/16/09   left hip   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 If you have any questions, ask your nurse or doctor.          STOP taking these medications    doxycycline 100 MG tablet Commonly known as: VIBRA-TABS Stopped by: Kathlene November, MD       TAKE these medications    amoxicillin 875 MG tablet Commonly known as: AMOXIL Take 1 tablet (875 mg total) by mouth 2 (two) times daily. Started by: Kathlene November, MD   aspirin 81 MG tablet Take 81 mg by mouth daily.   CALCIUM 1200 PO Take 4 tablets by mouth daily. Raw calcium    GLUCOSAMINE-CHONDROITIN-MSM PO Take 3,200 mg by mouth 2 (two) times daily.   lisinopril 20 MG tablet Commonly known as: ZESTRIL Take 1 tablet (20 mg total) by mouth daily.   multivitamin per tablet Take 1 tablet by mouth daily.   NEOMYCIN-POLYMYXIN-HYDROCORTISONE 1 % Soln OTIC solution Commonly known as: CORTISPORIN Place 3 drops into both ears every 8 (eight) hours. Started by: Kathlene November, MD   Omega 3 1200 MG Caps Take 1 capsule by mouth 2 (two) times daily.   promethazine-dextromethorphan 6.25-15 MG/5ML syrup Commonly known as: PROMETHAZINE-DM Take 5 mLs by mouth 4 (four) times daily as needed.   rosuvastatin 20 MG tablet Commonly known as: CRESTOR Take 1 tablet (20 mg total) by mouth daily.   sertraline 100 MG tablet Commonly known as: ZOLOFT Take 100 mg by mouth daily.   timolol 0.5 % ophthalmic solution Commonly known as: TIMOPTIC Place 1 drop into the left eye 2 (two) times daily.   TURMERIC PO Take 940 mg by mouth 2 (two) times daily.   Vitamin D3 50 MCG (2000 UT) Tabs Take 1 capsule by mouth daily.  Objective:   Physical Exam BP 126/80 (BP Location: Left Arm, Patient Position: Sitting, Cuff Size: Small)   Pulse 89   Temp 98.2 F (36.8 C) (Oral)   Resp 18   Ht '6\' 1"'$  (1.854 m)   Wt 190 lb 4 oz (86.3 kg)   SpO2 97%   BMI 25.10 kg/m  General:   Well developed, NAD, BMI noted. HEENT:  Normocephalic . Face symmetric, atraumatic. R ear: Normal L ear: Tympanic membrane is red but no bulge, canal is slightly swollen, red.  No actual discharge noted. Trago sign (-) Nose congested. Throat: Symmetric, no white patches Lungs:  CTA B Normal respiratory effort, no intercostal retractions, no accessory muscle use. Heart: RRR,  no murmur.  Lower extremities: no pretibial edema bilaterally  Skin: Not pale. Not jaundice Neurologic:  alert & oriented X3.  Speech normal, gait appropriate for age and unassisted Psych--  Cognition and judgment  appear intact.  Cooperative with normal attention span and concentration.  Behavior appropriate. No anxious or depressed appearing.      Assessment     75 year old gentleman, PMH includes hyperlipidemia, HTN, DJD, prostate cancer XRT 2020, presents with:  L otalgia: On exam, he has evidence of external and medial otitis  >>  TM is red but not bulge. Plan: Cortisporin eardrops, amoxicillin for 5 days.  Call if not gradually better. Cough: Having "colds" on and off since March, symptoms includes cough and nasal congestion. Recommend consistent use of Flonase, Astepro. If that is not helping rec to discuss with PCP, change lisinopril?  This visit occurred during the SARS-CoV-2 public health emergency.  Safety protocols were in place, including screening questions prior to the visit, additional usage of staff PPE, and extensive cleaning of exam room while observing appropriate contact time as indicated for disinfecting solutions.

## 2020-08-21 ENCOUNTER — Telehealth: Payer: Self-pay

## 2020-08-21 MED ORDER — AMOXICILLIN 875 MG PO TABS
875.0000 mg | ORAL_TABLET | Freq: Two times a day (BID) | ORAL | 0 refills | Status: DC
Start: 2020-08-21 — End: 2020-08-27

## 2020-08-21 NOTE — Telephone Encounter (Signed)
Please advise 

## 2020-08-21 NOTE — Telephone Encounter (Signed)
Pt called stating he is still having ear pain from last visit, and the cough is still lingering but no mucus and all antibiotics are finished..wants to know any suggestions or his next steps

## 2020-08-21 NOTE — Telephone Encounter (Signed)
Spoke w/ Pt- informed of recommendations- he denies drainage from ear and severe pain. He will let us know if he needs more ear drops and will come back next week if not improving.

## 2020-08-21 NOTE — Telephone Encounter (Signed)
If he has severe symptoms or ear discharge: Needs to be seen asap If has only otalgia: Send a second round of amoxicillin x5 days Continue using eardrops for 5 additional days, send another prescription if needed. If not back to normal in few days: Office visit next week.

## 2020-08-26 ENCOUNTER — Telehealth: Payer: Self-pay | Admitting: Family Medicine

## 2020-08-26 NOTE — Telephone Encounter (Signed)
Patient is calling regarding his ear infection. He last saw Dr. Larose Kells on 08/11 and his ear ache has not gotten better. He wants advice on what to do.  Please advice.

## 2020-08-26 NOTE — Telephone Encounter (Signed)
Informed Pt last week if he was not better he needed to be seen again. Please schedule appt at his convenience.

## 2020-08-27 ENCOUNTER — Ambulatory Visit: Payer: Medicare PPO | Admitting: Family Medicine

## 2020-08-27 ENCOUNTER — Encounter: Payer: Self-pay | Admitting: Family Medicine

## 2020-08-27 ENCOUNTER — Other Ambulatory Visit: Payer: Self-pay

## 2020-08-27 VITALS — BP 100/80 | HR 83 | Temp 97.7°F | Resp 18 | Ht 73.0 in | Wt 186.0 lb

## 2020-08-27 DIAGNOSIS — F419 Anxiety disorder, unspecified: Secondary | ICD-10-CM

## 2020-08-27 DIAGNOSIS — H66002 Acute suppurative otitis media without spontaneous rupture of ear drum, left ear: Secondary | ICD-10-CM | POA: Diagnosis not present

## 2020-08-27 MED ORDER — CEFDINIR 300 MG PO CAPS: 300.0000 mg | ORAL_CAPSULE | Freq: Two times a day (BID) | ORAL | 0 refills | Status: DC

## 2020-08-27 MED ORDER — PREDNISONE 10 MG PO TABS: ORAL_TABLET | ORAL | 0 refills | Status: DC

## 2020-08-27 NOTE — Progress Notes (Signed)
Subjective:   By signing my name below, I, [name], attest that this documentation has been prepared under the direction and in the presence of Dr. Roma Schanz, DO. 08/27/2020    Patient ID: Antonio Stephens, male    DOB: December 22, 1945, 75 y.o.   MRN: [id number]  Chief Complaint  Patient presents with   Ear Pain    X2 weeks, left ear.  Pt states feels like swimmers ear.     HPI Patient is in today for a office visit. He complains of an left earache. He has seen another provider and was given eardrops and amoxicillin to manage his pain and found mild relief. He also notes hearing loss and feeling like his left ear is under water.  He has finished the abx and feels no better.  Pt also describes inc stress and anxiety.  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   Depression    Glaucoma    bilateral   Hyperlipidemia    Hypertension    Urinary retention     Past Surgical History:  Procedure Laterality Date   COLONOSCOPY     HIP RESURFACING Right 11/04/2015   inguinal Herniorrhaphy     left   KNEE ARTHROSCOPY  04/2005   right   KNEE SURGERY  09-2009   partial replacemente , R   PARTIAL KNEE ARTHROPLASTY Left 10.10.16   PROSTATE BIOPSY  02/2016   TOTAL HIP ARTHROPLASTY  12/16/09   left hip   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     benign/fatty tumor    Family History  Problem Relation Age of Onset   Alzheimer's disease Mother 572  Cancer Father 683      sm cell carcinoma lung   Cancer Sister 529      breast   Breast cancer Sister     Hyperlipidemia Paternal Grandfather    Colon cancer Neg Hx    Stomach cancer Neg Hx     Social History   Socioeconomic History   Marital status: Married    Spouse name: Not on file   Number of children: Not on file   Years of education: Not on file   Highest education level: Not on file  Occupational History   Occupation: STeacher, early years/pre Tobacco Use   Smoking status: Former    Years: 0.20    Types: Cigarettes    Quit date: 09/15/1967    Years since quitting: 52.9   Smokeless tobacco: Never  Substance and Sexual Activity   Alcohol use: Yes    Alcohol/week: 7.0 - 10.0 standard drinks    Types: 7 - 10 Glasses of wine per week    Comment: sometimes nightly   Drug use: No   Sexual activity: Yes  Other Topics Concern   Not on file  Social History Narrative   Not on file   Social Determinants of Health   Financial Resource Strain: Not on file  Food Insecurity: Not on file  Transportation Needs: Not  on file  Physical Activity: Not on file  Stress: Not on file  Social Connections: Not on file  Intimate Partner Violence: Not on file    Outpatient Medications Prior to Visit  Medication Sig Dispense Refill   aspirin 81 MG tablet Take 81 mg by mouth daily.     Calcium Carbonate-Vit D-Min (CALCIUM 1200 PO) Take 4 tablets by mouth daily. Raw calcium     Cholecalciferol (VITAMIN D3) 2000 units TABS Take 1 capsule by mouth daily.     GLUCOSAMINE-CHONDROITIN-MSM PO Take 3,200 mg by mouth 2 (two) times daily.     lisinopril (ZESTRIL) 20 MG tablet Take 1 tablet (20 mg total) by mouth daily. 90 tablet 1   multivitamin (THERAGRAN) per tablet Take 1 tablet by mouth daily.     Omega 3 1200 MG CAPS Take 1 capsule by mouth 2 (two) times daily.     rosuvastatin (CRESTOR) 20 MG tablet Take 1 tablet (20 mg total) by mouth daily. 90 tablet 1   sertraline (ZOLOFT) 100 MG tablet Take 100 mg by mouth daily.     timolol (TIMOPTIC) 0.5 % ophthalmic solution Place 1 drop into the left eye 2 (two)  times daily.      TURMERIC PO Take 940 mg by mouth 2 (two) times daily.     NEOMYCIN-POLYMYXIN-HYDROCORTISONE (CORTISPORIN) 1 % SOLN OTIC solution Place 3 drops into both ears every 8 (eight) hours. (Patient not taking: Reported on 08/27/2020) 10 mL 0   promethazine-dextromethorphan (PROMETHAZINE-DM) 6.25-15 MG/5ML syrup Take 5 mLs by mouth 4 (four) times daily as needed. (Patient not taking: Reported on 08/27/2020) 118 mL 0   amoxicillin (AMOXIL) 875 MG tablet Take 1 tablet (875 mg total) by mouth 2 (two) times daily. (Patient not taking: Reported on 08/27/2020) 10 tablet 0   No facility-administered medications prior to visit.    No Known Allergies  Review of Systems  Constitutional:  Negative for fever and malaise/fatigue.  HENT:  Positive for ear pain (left ear) and hearing loss (left ear). Negative for congestion.   Eyes:  Negative for blurred vision.  Respiratory:  Negative for cough and shortness of breath.   Cardiovascular:  Negative for chest pain, palpitations and leg swelling.  Gastrointestinal:  Negative for vomiting.  Musculoskeletal:  Negative for back pain.  Skin:  Negative for rash.  Neurological:  Negative for loss of consciousness and headaches.  Psychiatric/Behavioral:  Negative for suicidal ideas. The patient is nervous/anxious.       Objective:    Physical Exam Constitutional:      General: He is not in acute distress.    Appearance: Normal appearance. He is not ill-appearing.  HENT:     Head: Normocephalic and atraumatic.     Right Ear: External ear normal.     Left Ear: External ear normal.     Ears:     Comments: Cerumen deep in left ear canal Eyes:     Extraocular Movements: Extraocular movements intact.     Pupils: Pupils are equal, round, and reactive to light.  Cardiovascular:     Rate and Rhythm: Normal rate and regular rhythm.     Heart sounds: Normal heart sounds. No murmur heard.   No gallop.  Pulmonary:     Effort: Pulmonary effort is normal.  No respiratory distress.     Breath sounds: Normal breath sounds. No wheezing or rales.  Skin:    General: Skin is warm and dry.  Neurological:     Mental Status:  He is alert and oriented to person, place, and time.  Psychiatric:        Behavior: Behavior normal.    BP 100/80 (BP Location: Right Arm, Patient Position: Sitting, Cuff Size: Normal)   Pulse 83   Temp 97.7 F (36.5 C) (Oral)   Resp 18   Ht '6\' 1"'$  (1.854 m)   Wt 186 lb (84.4 kg)   SpO2 96%   BMI 24.54 kg/m  Wt Readings from Last 3 Encounters:  08/27/20 186 lb (84.4 kg)  08/15/20 190 lb 4 oz (86.3 kg)  06/21/20 195 lb 9.6 oz (88.7 kg)       Assessment & Plan:   Problem List Items Addressed This Visit       Unprioritized   Anxiety    Recently worsening con't zoloft F/u psych--- Dr Harlene Ramus      Non-recurrent acute suppurative otitis media of left ear without spontaneous rupture of tympanic membrane - Primary    abx per orders Pt has app with ent at [facility]       Relevant Medications   cefdinir (OMNICEF) 300 MG capsule   predniSONE (DELTASONE) 10 MG tablet   Other Relevant Orders   Ambulatory referral to ENT     Meds ordered this encounter  Medications   cefdinir (OMNICEF) 300 MG capsule    Sig: Take 1 capsule (300 mg total) by mouth 2 (two) times daily.    Dispense:  20 capsule    Refill:  0   predniSONE (DELTASONE) 10 MG tablet    Sig: TAKE 3 TABLETS PO QD FOR 3 DAYS THEN TAKE 2 TABLETS PO QD FOR 3 DAYS THEN TAKE 1 TABLET PO QD FOR 3 DAYS THEN TAKE 1/2 TAB PO QD FOR 3 DAYS    Dispense:  20 tablet    Refill:  0    I, Dr. Roma Schanz, DO, personally preformed the services described in this documentation.  All medical record entries made by the scribe were at my direction and in my presence.  I have reviewed the chart and discharge instructions (if applicable) and agree that the record reflects my personal performance and is accurate and complete. 08/27/2020   I,[name],acting as  a scribe for Ann Held, DO.,have documented all relevant documentation on the behalf of Ann Held, DO,as directed by  Ann Held, DO while in the presence of Ann Held, DO.   Ann Held, DO

## 2020-08-27 NOTE — Assessment & Plan Note (Signed)
abx per orders Pt has app with ent at [facility]

## 2020-08-27 NOTE — Patient Instructions (Signed)
Otitis Media, Adult  Otitis media occurs when there is inflammation and fluid in the middle ear space with signs and symptoms of an acute infection. The middle ear is a part of the ear that contains bones for hearing as well as air that helps send sounds to the brain. When infected fluid builds up in this space, it causes pressure and results in symptoms of acute otitis media. The eustachian tube connects the middle ear to the back of the nose (nasopharynx) and normally allows air into the middle ear space. If the eustachian tubebecomes blocked, fluid can build up and become infected. What are the causes? This condition is caused by a blockage in the eustachian tube. This can be caused by an object like mucus, or by swelling (edema) of the tube. Problems that can cause a blockage include: A cold or other upper respiratory infection. Allergies. An irritant, such as tobacco smoke. Enlarged adenoids. The adenoids are areas of soft tissue located high in the back of the throat, behind the nose and the roof of the mouth. They are part of the body's defense system (immune system). A mass in the nasopharynx. Damage to the ear caused by pressure changes (barotrauma). What are the signs or symptoms? Symptoms of this condition include: Ear pain. Fever. Decreased hearing. Tiredness (lethargy). Fluid leaking from the ear, if the eardrum is ruptured or has burst. Ringing in the ear. How is this diagnosed?  This condition is diagnosed with a physical exam. During the exam, your health care provider will use an instrument called an otoscope to look in your ear and check for redness, swelling, and fluid. He or she will also ask about yoursymptoms. Your health care provider may also order tests, such as: A pneumatic otoscopy. This is a test to check the movement of the eardrum. It is done by squeezing a small amount of air into the ear. A tympanogram is a test that shows how well the eardrum moves in response  to air pressure in the ear canal. It provides a graph for your health care provider to review. How is this treated? This condition can go away on its own within 3-5 days. But if the condition is caused by a bacterial infection and does not go away on its own, or if it keeps coming back, your health care provider may: Prescribe antibiotic medicine to treat the infection. Prescribe or recommend medicines to control pain. Follow these instructions at home: Take over-the-counter and prescription medicines only as told by your health care provider. If you were prescribed an antibiotic medicine, take it as told by your health care provider. Do not stop taking the antibiotic even if you start to feel better. Keep all follow-up visits as told by your health care provider. This is important. Contact a health care provider if: You have bleeding from your nose. There is a lump on your neck. You are not feeling better in 5 days. You feel worse instead of better. Get help right away if: You have severe pain that is not controlled with medicine. You have swelling, redness, or pain around your ear. You have stiffness in your neck. A part of your face is not moving (paralyzed). The bone behind your ear (mastoid) is tender when you touch it. You develop a severe headache. Summary Otitis media is redness, soreness, and swelling of the middle ear, usually resulting in pain. This condition can go away on its own within 3-5 days. If the problem does not go   away in 3-5 days, your health care provider may prescribe or recommend medicines to treat the infection or your symptoms. If you were prescribed an antibiotic medicine, take it as told by your health care provider. Follow all instructions you were given by your health care provider. This information is not intended to replace advice given to you by your health care provider. Make sure you discuss any questions you have with your healthcare  provider. Document Revised: 11/24/2018 Document Reviewed: 11/24/2018 Elsevier Patient Education  2022 [employer] Inc.  

## 2020-08-27 NOTE — Assessment & Plan Note (Signed)
Recently worsening con't zoloft F/u psych--- Dr Harlene Ramus

## 2020-08-29 ENCOUNTER — Telehealth: Payer: Self-pay | Admitting: Family Medicine

## 2020-08-29 NOTE — Telephone Encounter (Signed)
Patient called with questions regarding his predniSONE (DELTASONE) 10 MG tablet  medication. He is not sure how he is supposed to take it. He states he's been taking 1 for breakfast, 1 for lunch, and 1 for dinner. He wants to know if he is supposed to take them all at once. Please advice

## 2020-08-29 NOTE — Telephone Encounter (Signed)
Spoke with patient. Pt advised how he was supposed to be taking medication. Pt advised to call back if his ears are not better

## 2020-09-02 DIAGNOSIS — C61 Malignant neoplasm of prostate: Secondary | ICD-10-CM | POA: Diagnosis not present

## 2020-09-04 ENCOUNTER — Other Ambulatory Visit: Payer: Self-pay | Admitting: Family Medicine

## 2020-09-13 DIAGNOSIS — H6592 Unspecified nonsuppurative otitis media, left ear: Secondary | ICD-10-CM | POA: Diagnosis not present

## 2020-09-13 DIAGNOSIS — R1314 Dysphagia, pharyngoesophageal phase: Secondary | ICD-10-CM | POA: Diagnosis not present

## 2020-09-25 DIAGNOSIS — R1314 Dysphagia, pharyngoesophageal phase: Secondary | ICD-10-CM | POA: Diagnosis not present

## 2020-09-25 DIAGNOSIS — K219 Gastro-esophageal reflux disease without esophagitis: Secondary | ICD-10-CM | POA: Diagnosis not present

## 2020-09-25 DIAGNOSIS — K449 Diaphragmatic hernia without obstruction or gangrene: Secondary | ICD-10-CM | POA: Diagnosis not present

## 2020-10-15 DIAGNOSIS — H9190 Unspecified hearing loss, unspecified ear: Secondary | ICD-10-CM | POA: Diagnosis not present

## 2020-10-15 DIAGNOSIS — K219 Gastro-esophageal reflux disease without esophagitis: Secondary | ICD-10-CM | POA: Diagnosis not present

## 2020-11-15 ENCOUNTER — Other Ambulatory Visit: Payer: Self-pay | Admitting: Family Medicine

## 2020-11-25 DIAGNOSIS — H401231 Low-tension glaucoma, bilateral, mild stage: Secondary | ICD-10-CM | POA: Diagnosis not present

## 2020-11-25 DIAGNOSIS — H04123 Dry eye syndrome of bilateral lacrimal glands: Secondary | ICD-10-CM | POA: Diagnosis not present

## 2020-11-25 DIAGNOSIS — Z961 Presence of intraocular lens: Secondary | ICD-10-CM | POA: Diagnosis not present

## 2020-12-10 ENCOUNTER — Encounter: Payer: Self-pay | Admitting: Internal Medicine

## 2021-01-13 ENCOUNTER — Ambulatory Visit: Payer: Medicare PPO | Admitting: Internal Medicine

## 2021-01-13 ENCOUNTER — Encounter: Payer: Self-pay | Admitting: Internal Medicine

## 2021-01-13 VITALS — BP 110/86 | HR 81 | Ht 70.25 in | Wt 193.0 lb

## 2021-01-13 DIAGNOSIS — Z86010 Personal history of colonic polyps: Secondary | ICD-10-CM | POA: Diagnosis not present

## 2021-01-13 DIAGNOSIS — R1310 Dysphagia, unspecified: Secondary | ICD-10-CM | POA: Diagnosis not present

## 2021-01-13 DIAGNOSIS — K219 Gastro-esophageal reflux disease without esophagitis: Secondary | ICD-10-CM | POA: Diagnosis not present

## 2021-01-13 NOTE — Patient Instructions (Signed)
If you are age 78 or older, your body mass index should be between 23-30. Your Body mass index is 27.5 kg/m. If this is out of the aforementioned range listed, please consider follow up with your Primary Care Provider.  If you are age 14 or younger, your body mass index should be between 19-25. Your Body mass index is 27.5 kg/m. If this is out of the aformentioned range listed, please consider follow up with your Primary Care Provider.   ________________________________________________________  The [facility] providers would like to encourage you to use [facility] to communicate with providers for non-urgent requests or questions.  Due to long hold times on the telephone, sending your provider a message by [facility] may be a faster and more efficient way to get a response.  Please allow 48 business hours for a response.  Please remember that this is for non-urgent requests.  _______________________________________________________  I will call you to schedule your endoscopy/colonoscopy

## 2021-01-15 ENCOUNTER — Encounter: Payer: Self-pay | Admitting: Internal Medicine

## 2021-01-15 NOTE — Progress Notes (Signed)
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  He presents today regarding problems with GERD, dysphagia, and the need for surveillance colonoscopy.  Patient was last evaluated June 2015.  At that time he underwent colonoscopy.  In addition to sigmoid diverticulosis he was noted to have 3 tubular adenomas which were removed.  Follow-up in 5 years recommended.  Patient denies any lower GI complaints.  He does have chronic have a history of chronic cough and regurgitation.  He was diagnosed with GERD and placed on Protonix.  This seemed to help.  He also mentions mild intermittent solid food dysphagia.  No weight loss.  No family history of gastrointestinal malignancy.  Review of outside blood work from March 2022 shows normal comprehensive metabolic panel and normal CBC with hemoglobin 14.1.  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   Depression    GERD (gastroesophageal reflux disease)    Glaucoma    bilateral   Hyperlipidemia    Hypertension    Prostate cancer ([place]) 2016   Urinary retention     Past Surgical History:  Procedure Laterality Date   COLONOSCOPY     HIP RESURFACING Right 11/04/2015   inguinal Herniorrhaphy     left   KNEE ARTHROSCOPY  04/2005   right   KNEE SURGERY  09-2009   partial replacemente , R   PARTIAL KNEE ARTHROPLASTY Left 10.10.16   PROSTATE BIOPSY  02/2016   TOTAL HIP ARTHROPLASTY  12/16/09   left hip   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 His smoking use included cigarettes. He has never used smokeless tobacco. He  reports current alcohol use of about 7.0 - 10.0 standard drinks per week. He reports that he does not use drugs.  family history includes Alzheimer's disease (age of onset: 57) in his mother; Breast cancer in his sister; Cancer (age of onset: 72) in his sister; Cancer (age of onset: 44) in his father; Hyperlipidemia in his paternal grandfather.  No Known Allergies     PHYSICAL EXAMINATION: Vital signs: BP 110/86    Pulse 81    Ht 5' 10.25" (1.784 m)    Wt 193 lb (87.5 kg)    SpO2 99%    BMI 27.50 kg/m   Constitutional: generally well-appearing, no acute distress Psychiatric: alert and oriented x3, cooperative Eyes: extraocular movements intact, anicteric, conjunctiva pink Mouth: oral pharynx moist, no lesions Neck: supple no lymphadenopathy Cardiovascular: heart regular rate and rhythm, no murmur Lungs: clear to auscultation bilaterally Abdomen: soft, nontender, nondistended, no obvious ascites, no peritoneal signs, normal bowel sounds, no organomegaly Rectal: Deferred till colonoscopy Extremities: no clubbing, cyanosis, or lower extremity edema bilaterally Skin: no lesions on visible extremities Neuro: No focal deficits.  Cranial nerves intact  ASSESSMENT:  1.  Cough and regurgitation improved with PPI.  Question GERD equivalent 2.  Intermittent solid food dysphagia.  Rule out peptic stricture 3.  History of multiple adenomatous colon polyps.  Overdue for surveillance   PLAN:  1.  Reflux  precautions 2.  Continue PPI 3.  Upper endoscopy with possible esophageal dilation.The nature of the procedure, as well as the risks, benefits, and alternatives were carefully and thoroughly reviewed with the patient. Ample time for discussion and questions allowed. The patient understood, was satisfied, and agreed to proceed.  4.  Surveillance colonoscopy with polypectomy if necessary.The nature of the procedure, as well as the risks, benefits, and alternatives were carefully and thoroughly  reviewed with the patient. Ample time for discussion and questions allowed. The patient understood, was satisfied, and agreed to proceed.

## 2021-01-27 ENCOUNTER — Telehealth: Payer: Self-pay | Admitting: Internal Medicine

## 2021-01-27 DIAGNOSIS — Z86010 Personal history of colonic polyps: Secondary | ICD-10-CM

## 2021-01-27 DIAGNOSIS — R1310 Dysphagia, unspecified: Secondary | ICD-10-CM

## 2021-01-27 NOTE — Telephone Encounter (Signed)
Inbound call from patient states he would like a call back to schedule appt. Would not schedule with me only wanted to speak with Dr. Henrene Pastor nurse who he spoke with before

## 2021-01-28 ENCOUNTER — Other Ambulatory Visit: Payer: Self-pay

## 2021-01-28 MED ORDER — SUTAB 1479-225-188 MG PO TABS: 1.0000 | ORAL_TABLET | Freq: Once | ORAL | 0 refills | Status: AC

## 2021-01-28 NOTE — Telephone Encounter (Signed)
Spoke with patient and clarified that I would mail his instructions.  He is to call me with any questions.

## 2021-01-29 ENCOUNTER — Encounter: Payer: Self-pay | Admitting: Family Medicine

## 2021-01-29 DIAGNOSIS — D229 Melanocytic nevi, unspecified: Secondary | ICD-10-CM

## 2021-02-19 DIAGNOSIS — L430 Hypertrophic lichen planus: Secondary | ICD-10-CM | POA: Diagnosis not present

## 2021-02-19 DIAGNOSIS — L821 Other seborrheic keratosis: Secondary | ICD-10-CM | POA: Diagnosis not present

## 2021-02-19 DIAGNOSIS — D485 Neoplasm of uncertain behavior of skin: Secondary | ICD-10-CM | POA: Diagnosis not present

## 2021-02-19 DIAGNOSIS — D1801 Hemangioma of skin and subcutaneous tissue: Secondary | ICD-10-CM | POA: Diagnosis not present

## 2021-02-19 DIAGNOSIS — L814 Other melanin hyperpigmentation: Secondary | ICD-10-CM | POA: Diagnosis not present

## 2021-03-03 DIAGNOSIS — C61 Malignant neoplasm of prostate: Secondary | ICD-10-CM | POA: Diagnosis not present

## 2021-03-19 ENCOUNTER — Encounter: Payer: Medicare PPO | Admitting: Internal Medicine

## 2021-04-01 DIAGNOSIS — M170 Bilateral primary osteoarthritis of knee: Secondary | ICD-10-CM | POA: Diagnosis not present

## 2021-04-03 ENCOUNTER — Other Ambulatory Visit: Payer: Self-pay | Admitting: Internal Medicine

## 2021-04-03 ENCOUNTER — Other Ambulatory Visit: Payer: Self-pay | Admitting: Family Medicine

## 2021-04-30 ENCOUNTER — Encounter: Payer: Self-pay | Admitting: Internal Medicine

## 2021-05-07 ENCOUNTER — Ambulatory Visit (AMBULATORY_SURGERY_CENTER): Payer: Medicare PPO | Admitting: Internal Medicine

## 2021-05-07 ENCOUNTER — Encounter: Payer: Self-pay | Admitting: Internal Medicine

## 2021-05-07 VITALS — BP 105/63 | HR 79 | Temp 98.1°F | Resp 14 | Ht 70.0 in | Wt 193.0 lb

## 2021-05-07 DIAGNOSIS — Z860100 Personal history of colon polyps, unspecified: Secondary | ICD-10-CM

## 2021-05-07 DIAGNOSIS — R1310 Dysphagia, unspecified: Secondary | ICD-10-CM

## 2021-05-07 DIAGNOSIS — K2900 Acute gastritis without bleeding: Secondary | ICD-10-CM | POA: Diagnosis not present

## 2021-05-07 DIAGNOSIS — K2970 Gastritis, unspecified, without bleeding: Secondary | ICD-10-CM | POA: Diagnosis not present

## 2021-05-07 DIAGNOSIS — K2950 Unspecified chronic gastritis without bleeding: Secondary | ICD-10-CM | POA: Diagnosis not present

## 2021-05-07 DIAGNOSIS — I10 Essential (primary) hypertension: Secondary | ICD-10-CM | POA: Diagnosis not present

## 2021-05-07 DIAGNOSIS — D123 Benign neoplasm of transverse colon: Secondary | ICD-10-CM

## 2021-05-07 DIAGNOSIS — K219 Gastro-esophageal reflux disease without esophagitis: Secondary | ICD-10-CM

## 2021-05-07 DIAGNOSIS — D122 Benign neoplasm of ascending colon: Secondary | ICD-10-CM | POA: Diagnosis not present

## 2021-05-07 MED ORDER — SODIUM CHLORIDE 0.9 % IV SOLN: 500.0000 mL | Freq: Once | INTRAVENOUS | Status: DC

## 2021-05-07 NOTE — Progress Notes (Signed)
Patient states he is not taking Xarelto, just Aspirin 81 mg ?

## 2021-05-07 NOTE — Progress Notes (Signed)
Called to room to assist during endoscopic procedure.  Patient ID and intended procedure confirmed with present staff. Received instructions for my participation in the procedure from the performing physician.  

## 2021-05-07 NOTE — Op Note (Signed)
[place] ?Patient Name: Antonio Stephens ?Procedure Date: 05/07/2021 11:00 AM ?MRN: [id number] ?Endoscopist: Docia Chuck. Henrene Pastor , MD ?Age: 76 ?Referring MD:  ?Date of Birth: 21-Aug-1945 ?Gender: Male ?Account #: [id number] ?Procedure:                Colonoscopy with cold snare polypectomy x 3; with  ?                          biopsy polypectomy x 1 ?Indications:              High risk colon cancer surveillance: Personal  ?                          history of multiple (3 or more) adenomas. Previous  ?                          examinations 2005 (elsewhere) and June 2015 ?Medicines:                Monitored Anesthesia Care ?Procedure:                Pre-Anesthesia Assessment: ?                          - Prior to the procedure, a History and Physical  ?                          was performed, and patient medications and  ?                          allergies were reviewed. The patient's tolerance of  ?                          previous anesthesia was also reviewed. The risks  ?                          and benefits of the procedure and the sedation  ?                          options and risks were discussed with the patient.  ?                          All questions were answered, and informed consent  ?                          was obtained. Prior Anticoagulants: The patient has  ?                          taken no previous anticoagulant or antiplatelet  ?                          agents. ASA Grade Assessment: II - A patient with  ?                          mild systemic disease. After reviewing the risks  ?  and benefits, the patient was deemed in  ?                          satisfactory condition to undergo the procedure. ?                          After obtaining informed consent, the colonoscope  ?                          was passed under direct vision. Throughout the  ?                          procedure, the patient's blood pressure, pulse, and  ?                          oxygen  saturations were monitored continuously. The  ?                          CF [id number] #[id number] was introduced through the anus  ?                          and advanced to the the cecum, identified by  ?                          appendiceal orifice and ileocecal valve. The  ?                          ileocecal valve, appendiceal orifice, and rectum  ?                          were photographed. The quality of the bowel  ?                          preparation was excellent. The colonoscopy was  ?                          performed without difficulty. The patient tolerated  ?                          the procedure well. The bowel preparation used was  ?                          SUPREP via split dose instruction. ?Scope In: 11:11:15 AM ?Scope Out: 11:24:13 AM ?Scope Withdrawal Time: 0 hours 10 minutes 43 seconds  ?Total Procedure Duration: 0 hours 12 minutes 58 seconds  ?Findings:                 Three polyps were found in the ascending colon. The  ?                          polyps were 1 to 3 mm in size. These polyps were  ?                          removed with a cold  snare. Resection and retrieval  ?                          were complete. ?                          A less than 1 mm polyp was found in the hepatic  ?                          flexure. The polyp was removed with a jumbo cold  ?                          forceps. Resection and retrieval were complete. ?                          Multiple small and large-mouthed diverticula were  ?                          found in the left colon and right colon. ?                          The exam was otherwise without abnormality on  ?                          direct and retroflexion views. ?Complications:            No immediate complications. Estimated blood loss:  ?                          None. ?Estimated Blood Loss:     Estimated blood loss: none. ?Impression:               - Three 1 to 3 mm polyps in the ascending colon,  ?                          removed with a cold  snare. Resected and retrieved. ?                          - One less than 1 mm polyp at the hepatic flexure,  ?                          removed with a jumbo cold forceps. Resected and  ?                          retrieved. ?                          - Diverticulosis in the left colon and in the right  ?                          colon. ?                          - The examination was otherwise normal on direct  ?  and retroflexion views. ?Recommendation:           - Repeat colonoscopy is not recommended for  ?                          surveillance. ?                          - Patient has a contact number available for  ?                          emergencies. The signs and symptoms of potential  ?                          delayed complications were discussed with the  ?                          patient. Return to normal activities tomorrow.  ?                          Written discharge instructions were provided to the  ?                          patient. ?                          - Resume previous diet. ?                          - Continue present medications. ?                          - Await pathology results. ?Docia Chuck. Henrene Pastor, MD ?05/07/2021 11:30:28 AM ?This report has been signed electronically. ?

## 2021-05-07 NOTE — Op Note (Signed)
[place] ?Patient Name: Antonio Stephens ?Procedure Date: 05/07/2021 11:00 AM ?MRN: [id number] ?Endoscopist: Docia Chuck. Henrene Pastor , MD ?Age: 76 ?Referring MD:  ?Date of Birth: [birth year] ?Gender: Male ?Account #: [id number] ?Procedure:                Upper GI endoscopy with biopsies; balloon dilation  ?                          of the esophagus. 20 mm max ?Indications:              Dysphagia, Esophageal reflux ?Medicines:                Monitored Anesthesia Care ?Procedure:                Pre-Anesthesia Assessment: ?                          - Prior to the procedure, a History and Physical  ?                          was performed, and patient medications and  ?                          allergies were reviewed. The patient's tolerance of  ?                          previous anesthesia was also reviewed. The risks  ?                          and benefits of the procedure and the sedation  ?                          options and risks were discussed with the patient.  ?                          All questions were answered, and informed consent  ?                          was obtained. Prior Anticoagulants: The patient has  ?                          taken no previous anticoagulant or antiplatelet  ?                          agents. ASA Grade Assessment: II - A patient with  ?                          mild systemic disease. After reviewing the risks  ?                          and benefits, the patient was deemed in  ?                          satisfactory condition to undergo the procedure. ?  After obtaining informed consent, the endoscope was  ?                          passed under direct vision. Throughout the  ?                          procedure, the patient's blood pressure, pulse, and  ?                          oxygen saturations were monitored continuously. The  ?                          Endoscope was introduced through the mouth, and  ?                          advanced to the  second part of duodenum. The upper  ?                          GI endoscopy was accomplished without difficulty.  ?                          The patient tolerated the procedure well. ?Scope In: ?Scope Out: ?Findings:                 The esophagus was normal. Due to complaints of  ?                          intermittent solid food dysphagia A TTS dilator was  ?                          passed through the scope. Dilation with an 18-19-20  ?                          mm balloon dilator was performed in the distal  ?                          esophagus and gastroesophageal junction to 20 mm.  ?                          No mucosal disruption. ?                          The stomach revealed mild gastritis with a few  ?                          superficial erosions. Biopsies were taken with a  ?                          cold forceps for histology. ?                          The examined duodenum was normal. ?                          The cardia  and gastric fundus were normal on  ?                          retroflexion, save small hiatal hernia. ?Complications:            No immediate complications. ?Estimated Blood Loss:     Estimated blood loss: none. ?Impression:               1. Endoscopically normal esophagus status post  ?                          empiric dilation to 20 mm ?                          2. Few gastric erosions status post biopsies ?                          3. Otherwise unremarkable EGD ?                          4. GERD ?Recommendation:           - Patient has a contact number available for  ?                          emergencies. The signs and symptoms of potential  ?                          delayed complications were discussed with the  ?                          patient. Return to normal activities tomorrow.  ?                          Written discharge instructions were provided to the  ?                          patient. ?                          -Post dilation diet. ?                          -  Continue present medications. ?                          - Await pathology results. ?                          ??? Return to care with primary provider. GI  ?                          follow-up as needed ?Docia Chuck. Henrene Pastor, MD ?05/07/2021 11:45:50 AM ?This report has been signed electronically. ?

## 2021-05-07 NOTE — Progress Notes (Signed)
Sedate, gd SR, tolerated procedure well, VSS, report to RN 

## 2021-05-07 NOTE — Patient Instructions (Signed)
Read all of the handouts given to you by your recovery room nurse. ? ?YOU HAD AN ENDOSCOPIC PROCEDURE TODAY AT [facility]:   Refer to the procedure report that was given to you for any specific questions about what was found during the examination.  If the procedure report does not answer your questions, please call your gastroenterologist to clarify.  If you requested that your care partner not be given the details of your procedure findings, then the procedure report has been included in a sealed envelope for you to review at your convenience later. ? ?YOU SHOULD EXPECT: Some feelings of bloating in the abdomen. Passage of more gas than usual.  Walking can help get rid of the air that was put into your GI tract during the procedure and reduce the bloating. If you had a lower endoscopy (such as a colonoscopy or flexible sigmoidoscopy) you may notice spotting of blood in your stool or on the toilet paper. If you underwent a bowel prep for your procedure, you may not have a normal bowel movement for a few days. ? ?Please Note:  You might notice some irritation and congestion in your nose or some drainage.  This is from the oxygen used during your procedure.  There is no need for concern and it should clear up in a day or so. ? ?SYMPTOMS TO REPORT IMMEDIATELY: ? ?Following lower endoscopy (colonoscopy or flexible sigmoidoscopy): ? Excessive amounts of blood in the stool ? Significant tenderness or worsening of abdominal pains ? Swelling of the abdomen that is new, acute ? Fever of 100?F or higher ? ?Following upper endoscopy (EGD) ? Vomiting of blood or coffee ground material ? New chest pain or pain under the shoulder blades ? Painful or persistently difficult swallowing ? New shortness of breath ? Fever of 100?F or higher ? Black, tarry-looking stools ? ?For urgent or emergent issues, a gastroenterologist can be reached at any hour by calling [phone]. ?Do not use MyChart messaging for urgent  concerns.  ? ? ?DIET:  We do recommend clear liquids until 1:30 p m. Then a soft diet for the rest of today. You may resume a regular diet tomorrow.  Drink plenty of fluids but you should avoid alcoholic beverages for 24 hours. ? ?ACTIVITY:  You should plan to take it easy for the rest of today and you should NOT DRIVE or use heavy machinery until tomorrow (because of the sedation medicines used during the test).   ? ?FOLLOW UP: ?Our staff will call the number listed on your records 48-72 hours following your procedure to check on you and address any questions or concerns that you may have regarding the information given to you following your procedure. If we do not reach you, we will leave a message.  We will attempt to reach you two times.  During this call, we will ask if you have developed any symptoms of COVID 19. If you develop any symptoms (ie: fever, flu-like symptoms, shortness of breath, cough etc.) before then, please call [phone].  If you test positive for Covid 19 in the 2 weeks post procedure, please call and report this information to [place].   ? ?If any biopsies were taken you will be contacted by phone or by letter within the next 1-3 weeks.  Please call us at [phone] if you have not heard about the biopsies in 3 weeks.  ? ? ?SIGNATURES/CONFIDENTIALITY: ?You and/or your care partner have signed paperwork which will  be entered into your electronic medical record.  These signatures attest to the fact that that the information above on your After Visit Summary has been reviewed and is understood.  Full responsibility of the confidentiality of this discharge information lies with you and/or your care-partner.  ?

## 2021-05-07 NOTE — Progress Notes (Signed)
HISTORY OF PRESENT ILLNESS: ?  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  He presents today regarding problems with GERD, dysphagia, and the need for surveillance colonoscopy.  Patient was last evaluated June 2015.  At that time he underwent colonoscopy.  In addition to sigmoid diverticulosis he was noted to have 3 tubular adenomas which were removed.  Follow-up in 5 years recommended.  Patient denies any lower GI complaints.  He does have chronic have a history of chronic cough and regurgitation.  He was diagnosed with GERD and placed on Protonix.  This seemed to help.  He also mentions mild intermittent solid food dysphagia.  No weight loss.  No family history of gastrointestinal malignancy.  Review of outside blood work from March 2022 shows normal comprehensive metabolic panel and normal CBC with hemoglobin 14.1. ?  ?REVIEW OF SYSTEMS: ?  ?All non-GI ROS negative unless otherwise stated in the HPI except for back pain ?  ?    ?Past Medical History:  ?Diagnosis Date  ? Back pain    ? COVID-19 10/2018  ? CVA (cerebral vascular accident) [facility])    ?  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 Depression    ? GERD (gastroesophageal reflux disease)    ? Glaucoma    ?  bilateral  ? Hyperlipidemia    ? Hypertension    ? Prostate cancer ([place]) 2016  ? Urinary retention    ?  ?  ?     ?Past Surgical History:  ?Procedure Laterality Date  ? COLONOSCOPY      ? HIP RESURFACING Right 11/04/2015  ? inguinal Herniorrhaphy      ?  left  ? KNEE ARTHROSCOPY   04/2005  ?  right  ? KNEE SURGERY   09-2009  ?  partial replacemente , R  ? PARTIAL KNEE ARTHROPLASTY Left 10.10.16  ? PROSTATE BIOPSY   02/2016  ? TOTAL HIP ARTHROPLASTY   12/16/09  ?  left hip  ? 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      ?  benign/fatty tumor  ?  ?  ?Social History ?Kelsey C Yazdi  reports that he quit smoking about 53 years ago. His smoking use included cigarettes.  He has never used smokeless tobacco. He reports current alcohol use of about 7.0 - 10.0 standard drinks per week. He reports that he does not use drugs. ?  ?family history includes Alzheimer's disease (age of onset: 44) in his mother; Breast cancer in his sister; Cancer (age of onset: 42) in his sister; Cancer (age of onset: 53) in his father; Hyperlipidemia in his paternal grandfather. ?  ?No Known Allergies ?  ?  ?  ?PHYSICAL EXAMINATION: ?Vital signs: BP 110/86   Pulse 81   Ht 5' 10.25" (1.784 m)   Wt 193 lb (87.5 kg)   SpO2 99%   BMI 27.50 kg/m?   ?Constitutional: generally well-appearing, no acute distress ?Psychiatric: alert and oriented x3, cooperative ?Eyes: extraocular movements intact, anicteric, conjunctiva pink ?Mouth: oral pharynx moist, no lesions ?Neck: supple no lymphadenopathy ?Cardiovascular: heart regular rate and rhythm, no murmur ?Lungs: clear to auscultation bilaterally ?Abdomen: soft, nontender, nondistended, no obvious ascites, no peritoneal signs, normal bowel sounds, no organomegaly ?Rectal: Deferred till colonoscopy ?Extremities: no clubbing, cyanosis, or lower extremity edema bilaterally ?Skin: no lesions on visible extremities ?Neuro: No focal deficits.  Cranial nerves intact ?  ?ASSESSMENT: ?  ?  1.  Cough and regurgitation improved with PPI.  Question GERD equivalent ?2.  Intermittent solid food dysphagia.  Rule out peptic stricture ?3.  History of multiple adenomatous colon polyps.  Overdue for surveillance ?  ?  ?PLAN: ?  ?1.  Reflux precautions ?2.  Continue PPI ?3.  Upper endoscopy with possible esophageal dilation.The nature of the procedure, as well as the risks, benefits, and alternatives were carefully and thoroughly reviewed with the patient. Ample time for discussion and questions allowed. The patient understood, was satisfied, and agreed to proceed.  ?4.  Surveillance colonoscopy with polypectomy if necessary.The nature of the procedure, as well as the risks, benefits,  and alternatives were carefully and thoroughly reviewed with the patient. Ample time for discussion and questions allowed. The patient understood, was satisfied, and agreed to proceed.  ? ?Patient was seen in the office as outlined above.  Presents today for colonoscopy and upper endoscopy esophageal dilation.  No interval change in his clinical history or physical exam ?  ?

## 2021-05-09 ENCOUNTER — Telehealth: Payer: Self-pay | Admitting: *Deleted

## 2021-05-09 NOTE — Telephone Encounter (Signed)
No answer on second attempt follow up call.  ? ?

## 2021-05-09 NOTE — Telephone Encounter (Signed)
Attempted to call patient for their post-procedure follow-up call. No answer. Left voicemail.   

## 2021-05-11 ENCOUNTER — Other Ambulatory Visit: Payer: Self-pay | Admitting: Family Medicine

## 2021-05-12 ENCOUNTER — Encounter: Payer: Self-pay | Admitting: Internal Medicine

## 2021-05-12 ENCOUNTER — Other Ambulatory Visit: Payer: Self-pay | Admitting: Family Medicine

## 2021-06-12 ENCOUNTER — Other Ambulatory Visit: Payer: Self-pay | Admitting: Family Medicine

## 2021-06-16 ENCOUNTER — Encounter: Payer: Self-pay | Admitting: Family Medicine

## 2021-06-16 ENCOUNTER — Ambulatory Visit (INDEPENDENT_AMBULATORY_CARE_PROVIDER_SITE_OTHER): Payer: Medicare PPO | Admitting: Family Medicine

## 2021-06-16 VITALS — BP 118/80 | HR 82 | Temp 98.1°F | Resp 18 | Ht 70.0 in | Wt 191.0 lb

## 2021-06-16 DIAGNOSIS — Z0000 Encounter for general adult medical examination without abnormal findings: Secondary | ICD-10-CM | POA: Diagnosis not present

## 2021-06-16 DIAGNOSIS — E785 Hyperlipidemia, unspecified: Secondary | ICD-10-CM | POA: Diagnosis not present

## 2021-06-16 DIAGNOSIS — E559 Vitamin D deficiency, unspecified: Secondary | ICD-10-CM

## 2021-06-16 DIAGNOSIS — I10 Essential (primary) hypertension: Secondary | ICD-10-CM | POA: Diagnosis not present

## 2021-06-16 DIAGNOSIS — C61 Malignant neoplasm of prostate: Secondary | ICD-10-CM

## 2021-06-16 MED ORDER — LISINOPRIL 20 MG PO TABS: 20.0000 mg | ORAL_TABLET | Freq: Every day | ORAL | 1 refills | Status: DC

## 2021-06-16 NOTE — Progress Notes (Signed)
Subjective:   By signing my name below, I, Antonio Stephens, attest that this documentation has been prepared under the direction and in the presence of Antonio Held DO 06/16/2021   Patient ID: Antonio Stephens, male    DOB: August 10, 1945, 76 y.o.   MRN: [id number]  Chief Complaint  Patient presents with   Annual Exam    Pt states not fasting     HPI Patient is in today for a comprehensive physical exam.  He is requesting a refill of 20 Mg of Lisinopril.   He complains of itchiness on his lower legs. He also has associated symptoms of dryness on the area. He uses his wifes lotion. He states that during the winter, he would have itching but during the summer, symptoms usually improved. However, as of recently his dryness is more severe than usually. He also has mild swelling in his right lower leg. He does not wear compression socks.   He is regularly following up with his radiology oncologist. He is not seeing a urologist. He reports his PSA levels are improving.   He reports that he has lower back pain. He regularly stretches.   He denies having any fever, new muscle pain, joint pain , new moles, congestion, sinus pain, sore throat, palpations, cough, SOB ,wheezing,n/v/d constipation, blood in stool, dysuria, frequency, hematuria, at this time  He denies of any changes to his family history Colonoscopy last completed on 05/07/2021 PSA last completed on 03/21/2020 He has received two Covid 19 vaccines. He is not interested in receiving boosters. He is UTD on the shingles and pneumonia vaccine.  He regularly exercises and stretches.  He is UTD on dental exams.  He is UTD on vision exams.   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   Depression    GERD (gastroesophageal reflux disease)    Glaucoma    bilateral    Hyperlipidemia    Hypertension    Prostate cancer ([place]) 2016   Urinary retention     Past Surgical History:  Procedure Laterality Date   COLONOSCOPY     HIP RESURFACING Right 11/04/2015   inguinal Herniorrhaphy     left   KNEE ARTHROSCOPY  04/2005   right   KNEE SURGERY  09-2009   partial replacemente , R   PARTIAL KNEE ARTHROPLASTY Left 10.10.16   PROSTATE BIOPSY  02/2016   TOTAL HIP ARTHROPLASTY  12/16/09   left hip   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     benign/fatty tumor    Family History  Problem Relation Age of Onset   Alzheimer's disease Mother 29   Cancer Father 77       sm cell carcinoma lung   Cancer Sister 65       breast   Breast cancer Sister    Hyperlipidemia Paternal Grandfather    Colon cancer Neg Hx    Stomach cancer Neg Hx    Esophageal cancer Neg Hx    Rectal cancer Neg Hx     Social History   Socioeconomic History   Marital status: Married    Spouse name: Not on file   Number of children: Not on file   Years of education: Not on file   Highest education level: Not on file  Occupational History   Occupation: Press photographer  textiles  Tobacco Use   Smoking status: Former    Years: 0.20    Types: Cigarettes    Quit date: 09/15/1967    Years since quitting: 53.7   Smokeless tobacco: Never  Vaping Use   Vaping Use: Never used  Substance and Sexual Activity   Alcohol use: Not Currently    Comment: occasionally   Drug use: No   Sexual activity: Yes  Other Topics Concern   Not on file  Social History Narrative   Not on file   Social Determinants of Health   Financial Resource Strain: Not on file  Food Insecurity: Not on file  Transportation Needs: Not on file  Physical Activity: Not on file  Stress: Not on file  Social Connections: Not on file  Intimate Partner Violence: Not on file    Outpatient Medications Prior to Visit  Medication Sig Dispense Refill   aspirin 81 MG tablet Take 81 mg by mouth daily.     Calcium Carbonate-Vit D-Min (CALCIUM  1200 PO) Take 4 tablets by mouth daily. Raw calcium     Cholecalciferol (VITAMIN D3) 2000 units TABS Take 1 capsule by mouth daily.     GLUCOSAMINE-CHONDROITIN-MSM PO Take 3,200 mg by mouth 2 (two) times daily.     multivitamin (THERAGRAN) per tablet Take 1 tablet by mouth daily.     Omega 3 1200 MG CAPS Take 1 capsule by mouth 2 (two) times daily.     pantoprazole (PROTONIX) 40 MG tablet Take 1 tablet by mouth daily.     rosuvastatin (CRESTOR) 20 MG tablet TAKE 1 TABLET(20 MG) BY MOUTH DAILY 90 tablet 1   sertraline (ZOLOFT) 100 MG tablet Take 100 mg by mouth daily.     timolol (TIMOPTIC) 0.5 % ophthalmic solution Place 1 drop into the left eye 2 (two) times daily.      TURMERIC PO Take 940 mg by mouth 2 (two) times daily.     lisinopril (ZESTRIL) 20 MG tablet Take 1 tablet (20 mg total) by mouth daily. Pt needs office visit for further refills 30 tablet 0   No facility-administered medications prior to visit.    No Known Allergies  Review of Systems  Constitutional:  Negative for fever.  HENT:  Negative for congestion, sinus pain and sore throat.   Respiratory:  Negative for wheezing.   Cardiovascular:  Positive for leg swelling (Right Lower Leg). Negative for palpitations.  Gastrointestinal:  Negative for blood in stool, constipation, diarrhea, nausea and vomiting.  Genitourinary:  Negative for dysuria, frequency and hematuria.  Musculoskeletal:  Positive for back pain (Lower). Negative for joint pain and myalgias.  Skin:  Positive for itching (Lower Right Leg).       (-) New Moles (+) Dryness in Lower Right Leg       Objective:    Physical Exam Constitutional:      General: He is not in acute distress.    Appearance: Normal appearance. He is not ill-appearing.  HENT:     Head: Normocephalic and atraumatic.     Right Ear: Tympanic membrane, ear canal and external ear normal.     Left Ear: Tympanic membrane, ear canal and external ear normal.  Eyes:     Extraocular  Movements: Extraocular movements intact.     Pupils: Pupils are equal, round, and reactive to light.  Cardiovascular:     Rate and Rhythm: Normal rate and regular rhythm.     Heart sounds: Normal heart sounds. No murmur heard.  No gallop.  Pulmonary:     Effort: Pulmonary effort is normal. No respiratory distress.     Breath sounds: Normal breath sounds. No wheezing or rales.  Abdominal:     General: Bowel sounds are normal. There is no distension.     Palpations: Abdomen is soft.     Tenderness: There is no abdominal tenderness. There is no guarding.  Skin:    General: Skin is warm and dry.  Neurological:     Mental Status: He is alert and oriented to person, place, and time.  Psychiatric:        Judgment: Judgment normal.     BP 118/80 (BP Location: Left Arm, Patient Position: Sitting, Cuff Size: Normal)   Pulse 82   Temp 98.1 F (36.7 C) (Oral)   Resp 18   Ht '5\' 10"'$  (1.778 m)   Wt 191 lb (86.6 kg)   SpO2 95%   BMI 27.41 kg/m  Wt Readings from Last 3 Encounters:  06/16/21 191 lb (86.6 kg)  05/07/21 193 lb (87.5 kg)  01/13/21 193 lb (87.5 kg)    Diabetic Foot Exam - Simple   No data filed    Lab Results  Component Value Date   WBC 7.6 06/16/2021   HGB 13.8 06/16/2021   HCT 41.2 06/16/2021   PLT 188.0 06/16/2021   GLUCOSE 95 06/16/2021   CHOL 153 06/16/2021   TRIG 244.0 (H) 06/16/2021   HDL 46.20 06/16/2021   LDLDIRECT 83.0 06/16/2021   LDLCALC 82 03/21/2020   ALT 18 06/16/2021   AST 22 06/16/2021   NA 139 06/16/2021   K 4.4 06/16/2021   CL 101 06/16/2021   CREATININE 1.07 06/16/2021   BUN 23 06/16/2021   CO2 30 06/16/2021   TSH 2.28 06/16/2021   PSA 0.30 06/16/2021   INR 0.91 07/29/2009   HGBA1C 5.6 12/06/2018   MICROALBUR 1.2 12/10/2009    Lab Results  Component Value Date   TSH 2.28 06/16/2021   Lab Results  Component Value Date   WBC 7.6 06/16/2021   HGB 13.8 06/16/2021   HCT 41.2 06/16/2021   MCV 94.4 06/16/2021   PLT 188.0  06/16/2021   Lab Results  Component Value Date   NA 139 06/16/2021   K 4.4 06/16/2021   CO2 30 06/16/2021   GLUCOSE 95 06/16/2021   BUN 23 06/16/2021   CREATININE 1.07 06/16/2021   BILITOT 0.8 06/16/2021   ALKPHOS 60 06/16/2021   AST 22 06/16/2021   ALT 18 06/16/2021   PROT 6.1 06/16/2021   ALBUMIN 4.2 06/16/2021   CALCIUM 9.4 06/16/2021   ANIONGAP 10 11/29/2015   GFR 66.89 06/16/2021   Lab Results  Component Value Date   CHOL 153 06/16/2021   Lab Results  Component Value Date   HDL 46.20 06/16/2021   Lab Results  Component Value Date   LDLCALC 82 03/21/2020   Lab Results  Component Value Date   TRIG 244.0 (H) 06/16/2021   Lab Results  Component Value Date   CHOLHDL 3 06/16/2021   Lab Results  Component Value Date   HGBA1C 5.6 12/06/2018       Assessment & Plan:   Problem List Items Addressed This Visit       Unprioritized   Prostate cancer ([place])   Relevant Orders   PSA (Completed)   Preventative health care - Primary    ghm utd Check lab  See avs      Relevant Orders   CBC with  Differential/Platelet (Completed)   Comprehensive metabolic panel (Completed)   Lipid panel (Completed)   TSH (Completed)   Hyperlipidemia    Encourage heart healthy diet such as MIND or DASH diet, increase exercise, avoid trans fats, simple carbohydrates and processed foods, consider a krill or fish or flaxseed oil cap daily.       Relevant Medications   lisinopril (ZESTRIL) 20 MG tablet   Other Relevant Orders   CBC with Differential/Platelet (Completed)   Comprehensive metabolic panel (Completed)   Lipid panel (Completed)   TSH (Completed)   Essential hypertension    Well controlled, no changes to meds. Encouraged heart healthy diet such as the DASH diet and exercise as tolerated.       Relevant Medications   lisinopril (ZESTRIL) 20 MG tablet   Other Visit Diagnoses     Primary hypertension       Relevant Medications   lisinopril (ZESTRIL) 20 MG tablet    Other Relevant Orders   CBC with Differential/Platelet (Completed)   Comprehensive metabolic panel (Completed)   Lipid panel (Completed)   TSH (Completed)   Vitamin D deficiency       Relevant Orders   VITAMIN D 25 Hydroxy (Vit-D Deficiency, Fractures) (Completed)       Meds ordered this encounter  Medications   lisinopril (ZESTRIL) 20 MG tablet    Sig: Take 1 tablet (20 mg total) by mouth daily.    Dispense:  90 tablet    Refill:  1    I, Antonio Held, DO, personally preformed the services described in this documentation.  All medical record entries made by the scribe were at my direction and in my presence.  I have reviewed the chart and discharge instructions (if applicable) and agree that the record reflects my personal performance and is accurate and complete. 06/16/2021  I,Amber Collins,acting as a scribe for [employer], DO.,have documented all relevant documentation on the behalf of Antonio Held, DO,as directed by  Antonio Held, DO while in the presence of Antonio Held, DO.  Antonio Held, DO

## 2021-06-16 NOTE — Patient Instructions (Signed)
Preventive Care 65 Years and Older, Male Preventive care refers to lifestyle choices and visits with your health care provider that can promote health and wellness. Preventive care visits are also called wellness exams. What can I expect for my preventive care visit? Counseling During your preventive care visit, your health care provider may ask about your: Medical history, including: Past medical problems. Family medical history. History of falls. Current health, including: Emotional well-being. Home life and relationship well-being. Sexual activity. Memory and ability to understand (cognition). Lifestyle, including: Alcohol, nicotine or tobacco, and drug use. Access to firearms. Diet, exercise, and sleep habits. Work and work environment. Sunscreen use. Safety issues such as seatbelt and bike helmet use. Physical exam Your health care provider will check your: Height and weight. These may be used to calculate your BMI (body mass index). BMI is a measurement that tells if you are at a healthy weight. Waist circumference. This measures the distance around your waistline. This measurement also tells if you are at a healthy weight and may help predict your risk of certain diseases, such as type 2 diabetes and high blood pressure. Heart rate and blood pressure. Body temperature. Skin for abnormal spots. What immunizations do I need?  Vaccines are usually given at various ages, according to a schedule. Your health care provider will recommend vaccines for you based on your age, medical history, and lifestyle or other factors, such as travel or where you work. What tests do I need? Screening Your health care provider may recommend screening tests for certain conditions. This may include: Lipid and cholesterol levels. Diabetes screening. This is done by checking your blood sugar (glucose) after you have not eaten for a while (fasting). Hepatitis C test. Hepatitis B test. HIV (human  immunodeficiency virus) test. STI (sexually transmitted infection) testing, if you are at risk. Lung cancer screening. Colorectal cancer screening. Prostate cancer screening. Abdominal aortic aneurysm (AAA) screening. You may need this if you are a current or former smoker. Talk with your health care provider about your test results, treatment options, and if necessary, the need for more tests. Follow these instructions at home: Eating and drinking  Eat a diet that includes fresh fruits and vegetables, whole grains, lean protein, and low-fat dairy products. Limit your intake of foods with high amounts of sugar, saturated fats, and salt. Take vitamin and mineral supplements as recommended by your health care provider. Do not drink alcohol if your health care provider tells you not to drink. If you drink alcohol: Limit how much you have to 0-2 drinks a day. Know how much alcohol is in your drink. In the [place] one drink equals one 12 oz bottle of beer (355 mL), one 5 oz glass of wine (148 mL), or one 1 oz glass of hard liquor (44 mL). Lifestyle Brush your teeth every morning and night with fluoride toothpaste. Floss one time each day. Exercise for at least 30 minutes 5 or more days each week. Do not use any products that contain nicotine or tobacco. These products include cigarettes, chewing tobacco, and vaping devices, such as e-cigarettes. If you need help quitting, ask your health care provider. Do not use drugs. If you are sexually active, practice safe sex. Use a condom or other form of protection to prevent STIs. Take aspirin only as told by your health care provider. Make sure that you understand how much to take and what form to take. Work with your health care provider to find out whether it is safe   and beneficial for you to take aspirin daily. Ask your health care provider if you need to take a cholesterol-lowering medicine (statin). Find healthy ways to manage stress, such  as: Meditation, yoga, or listening to music. Journaling. Talking to a trusted person. Spending time with friends and family. Safety Always wear your seat belt while driving or riding in a vehicle. Do not drive: If you have been drinking alcohol. Do not ride with someone who has been drinking. When you are tired or distracted. While texting. If you have been using any mind-altering substances or drugs. Wear a helmet and other protective equipment during sports activities. If you have firearms in your house, make sure you follow all gun safety procedures. Minimize exposure to UV radiation to reduce your risk of skin cancer. What's next? Visit your health care provider once a year for an annual wellness visit. Ask your health care provider how often you should have your eyes and teeth checked. Stay up to date on all vaccines. This information is not intended to replace advice given to you by your health care provider. Make sure you discuss any questions you have with your health care provider. Document Revised: 06/19/2020 Document Reviewed: 06/19/2020 Elsevier Patient Education  2023 Elsevier Inc.  

## 2021-06-17 LAB — CBC WITH DIFFERENTIAL/PLATELET
Basophils Absolute: 0.1 10*3/uL (ref 0.0–0.1)
Basophils Relative: 1.1 % (ref 0.0–3.0)
Eosinophils Absolute: 0.2 10*3/uL (ref 0.0–0.7)
Eosinophils Relative: 2.9 % (ref 0.0–5.0)
HCT: 41.2 % (ref 39.0–52.0)
Hemoglobin: 13.8 g/dL (ref 13.0–17.0)
Lymphocytes Relative: 27.8 % (ref 12.0–46.0)
Lymphs Abs: 2.1 10*3/uL (ref 0.7–4.0)
MCHC: 33.4 g/dL (ref 30.0–36.0)
MCV: 94.4 fl (ref 78.0–100.0)
Monocytes Absolute: 0.7 10*3/uL (ref 0.1–1.0)
Monocytes Relative: 9.2 % (ref 3.0–12.0)
Neutro Abs: 4.5 10*3/uL (ref 1.4–7.7)
Neutrophils Relative %: 59 % (ref 43.0–77.0)
Platelets: 188 10*3/uL (ref 150.0–400.0)
RBC: 4.37 Mil/uL (ref 4.22–5.81)
RDW: 13.6 % (ref 11.5–15.5)
WBC: 7.6 10*3/uL (ref 4.0–10.5)

## 2021-06-17 LAB — LIPID PANEL
Cholesterol: 153 mg/dL (ref 0–200)
HDL: 46.2 mg/dL (ref >39.00)
NonHDL: 106.75
Total CHOL/HDL Ratio: 3
Triglycerides: 244 mg/dL — ABNORMAL HIGH (ref 0.0–149.0)
VLDL: 48.8 mg/dL — ABNORMAL HIGH (ref 0.0–40.0)

## 2021-06-17 LAB — COMPREHENSIVE METABOLIC PANEL
ALT: 18 U/L (ref 0–53)
AST: 22 U/L (ref 0–37)
Albumin: 4.2 g/dL (ref 3.5–5.2)
Alkaline Phosphatase: 60 U/L (ref 39–117)
BUN: 23 mg/dL (ref 6–23)
CO2: 30 mEq/L (ref 19–32)
Calcium: 9.4 mg/dL (ref 8.4–10.5)
Chloride: 101 mEq/L (ref 96–112)
Creatinine, Ser: 1.07 mg/dL (ref 0.40–1.50)
GFR: 66.89 mL/min (ref >60.00)
Glucose, Bld: 95 mg/dL (ref 70–99)
Potassium: 4.4 mEq/L (ref 3.5–5.1)
Sodium: 139 mEq/L (ref 135–145)
Total Bilirubin: 0.8 mg/dL (ref 0.2–1.2)
Total Protein: 6.1 g/dL (ref 6.0–8.3)

## 2021-06-17 LAB — VITAMIN D 25 HYDROXY (VIT D DEFICIENCY, FRACTURES): VITD: 50.03 ng/mL (ref 30.00–100.00)

## 2021-06-17 LAB — PSA: PSA: 0.3 ng/mL (ref 0.10–4.00)

## 2021-06-17 LAB — TSH: TSH: 2.28 u[IU]/mL (ref 0.35–5.50)

## 2021-06-17 LAB — LDL CHOLESTEROL, DIRECT: Direct LDL: 83 mg/dL

## 2021-06-17 NOTE — Assessment & Plan Note (Signed)
Encourage heart healthy diet such as MIND or DASH diet, increase exercise, avoid trans fats, simple carbohydrates and processed foods, consider a krill or fish or flaxseed oil cap daily.  °

## 2021-06-17 NOTE — Assessment & Plan Note (Signed)
Well controlled, no changes to meds. Encouraged heart healthy diet such as the DASH diet and exercise as tolerated.  °

## 2021-06-17 NOTE — Assessment & Plan Note (Signed)
ghm utd Check lab  See avs  

## 2021-09-02 DIAGNOSIS — C61 Malignant neoplasm of prostate: Secondary | ICD-10-CM | POA: Diagnosis not present

## 2021-09-23 ENCOUNTER — Telehealth: Payer: Self-pay | Admitting: Family Medicine

## 2021-09-23 NOTE — Telephone Encounter (Signed)
Pt declined OV

## 2021-09-23 NOTE — Telephone Encounter (Signed)
Patient called to advise he has a lingering cough. He is taking mucinex but he is still coughing. Patient wants to know if there is something else he can do to help with the coughing. Patient declined seeing another provider this week.

## 2021-09-24 ENCOUNTER — Ambulatory Visit: Payer: Medicare PPO | Admitting: Family Medicine

## 2021-09-25 ENCOUNTER — Encounter: Payer: Self-pay | Admitting: Family Medicine

## 2021-09-25 ENCOUNTER — Ambulatory Visit: Payer: Medicare PPO | Admitting: Family Medicine

## 2021-09-25 VITALS — BP 98/70 | HR 84 | Temp 98.7°F | Resp 18 | Ht 70.0 in | Wt 186.2 lb

## 2021-09-25 DIAGNOSIS — B9689 Other specified bacterial agents as the cause of diseases classified elsewhere: Secondary | ICD-10-CM

## 2021-09-25 DIAGNOSIS — J208 Acute bronchitis due to other specified organisms: Secondary | ICD-10-CM

## 2021-09-25 MED ORDER — AZITHROMYCIN 250 MG PO TABS: ORAL_TABLET | ORAL | 0 refills | Status: DC

## 2021-09-25 NOTE — Progress Notes (Signed)
Chief Complaint  Patient presents with   Cough    X2 weeks, pt states having productive cough, fatigue, no headache or sore throat. Negative COVID 2 weeks ago. Pt states otc meds     Zed C Spease here for URI complaints.  Duration: 2 weeks; was improving then worsened Associated symptoms:  productive cough Denies: sinus congestion, sinus pain, rhinorrhea, itchy watery eyes, ear pain, ear drainage, sore throat, wheezing, shortness of breath, myalgia, and fevers, N/V/D Treatment to date: Mucinex Sick contacts: No Tested ne for covid.   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   Depression    GERD (gastroesophageal reflux disease)    Glaucoma    bilateral   Hyperlipidemia    Hypertension    Prostate cancer ([place]) 2016   Urinary retention     Objective BP 98/70 (BP Location: Right Arm, Patient Position: Sitting)   Pulse 84   Temp 98.7 F (37.1 C) (Oral)   Resp 18   Ht '5\' 10"'$  (1.778 m)   Wt 186 lb 3.2 oz (84.5 kg)   SpO2 96%   BMI 26.72 kg/m  General: Awake, alert, appears stated age HEENT: AT, [place], ears patent b/l and TM's neg, nares patent w/o discharge, pharynx pink and without exudates, MMM Neck: No masses or asymmetry Heart: RRR Lungs: CTAB, no accessory muscle use Psych: Age appropriate judgment and insight, normal mood and affect  Acute bacterial bronchitis - Plan: azithromycin (ZITHROMAX) 250 MG tablet  Continue to push fluids, practice good hand hygiene, cover mouth when coughing. Cont Mucinex.  F/u prn. If starting to experience fevers, shaking, or shortness of breath, seek immediate care. Pt voiced understanding and agreement to the plan.  [place], DO 09/25/21 3:24 PM

## 2021-09-25 NOTE — Patient Instructions (Signed)
Continue to push fluids, practice good hand hygiene, and cover your mouth if you cough. ? ?If you start having fevers, shaking or shortness of breath, seek immediate care. ? ?OK to take Tylenol 1000 mg (2 extra strength tabs) or 975 mg (3 regular strength tabs) every 6 hours as needed. ? ?Let us know if you need anything. ?

## 2021-10-09 ENCOUNTER — Ambulatory Visit: Payer: Medicare PPO | Admitting: Family Medicine

## 2021-12-01 DIAGNOSIS — I10 Essential (primary) hypertension: Secondary | ICD-10-CM | POA: Diagnosis not present

## 2021-12-01 DIAGNOSIS — H401231 Low-tension glaucoma, bilateral, mild stage: Secondary | ICD-10-CM | POA: Diagnosis not present

## 2021-12-01 DIAGNOSIS — H04123 Dry eye syndrome of bilateral lacrimal glands: Secondary | ICD-10-CM | POA: Diagnosis not present

## 2021-12-21 ENCOUNTER — Other Ambulatory Visit: Payer: Self-pay | Admitting: Family Medicine

## 2021-12-21 DIAGNOSIS — I10 Essential (primary) hypertension: Secondary | ICD-10-CM

## 2022-01-24 ENCOUNTER — Other Ambulatory Visit: Payer: Self-pay | Admitting: Family Medicine

## 2022-01-24 DIAGNOSIS — I10 Essential (primary) hypertension: Secondary | ICD-10-CM

## 2022-02-20 DIAGNOSIS — H9190 Unspecified hearing loss, unspecified ear: Secondary | ICD-10-CM | POA: Diagnosis not present

## 2022-02-28 ENCOUNTER — Other Ambulatory Visit: Payer: Self-pay | Admitting: Family Medicine

## 2022-02-28 DIAGNOSIS — I10 Essential (primary) hypertension: Secondary | ICD-10-CM

## 2022-03-03 DIAGNOSIS — C61 Malignant neoplasm of prostate: Secondary | ICD-10-CM | POA: Diagnosis not present

## 2022-03-20 ENCOUNTER — Encounter: Payer: Self-pay | Admitting: Family Medicine

## 2022-03-20 ENCOUNTER — Ambulatory Visit (INDEPENDENT_AMBULATORY_CARE_PROVIDER_SITE_OTHER): Payer: Medicare HMO | Admitting: Family Medicine

## 2022-03-20 VITALS — BP 134/80 | HR 84 | Temp 98.7°F | Resp 18 | Ht 70.0 in | Wt 189.2 lb

## 2022-03-20 DIAGNOSIS — I10 Essential (primary) hypertension: Secondary | ICD-10-CM

## 2022-03-20 DIAGNOSIS — E785 Hyperlipidemia, unspecified: Secondary | ICD-10-CM | POA: Diagnosis not present

## 2022-03-20 DIAGNOSIS — M722 Plantar fascial fibromatosis: Secondary | ICD-10-CM

## 2022-03-20 DIAGNOSIS — G5762 Lesion of plantar nerve, left lower limb: Secondary | ICD-10-CM | POA: Diagnosis not present

## 2022-03-20 MED ORDER — LISINOPRIL 20 MG PO TABS: 20.0000 mg | ORAL_TABLET | Freq: Every day | ORAL | 3 refills | Status: DC

## 2022-03-20 NOTE — Progress Notes (Signed)
Subjective:   By signing my name below, I, Marlana Latus, attest that this documentation has been prepared under the direction and in the presence of Ann Held, DO 03/22/22   Patient ID: Antonio Stephens, male    DOB: 10-28-1945, 77 y.o.   MRN: [id number]  Chief Complaint  Patient presents with   Foot Problem    Left foot, pt states having foot pain and some swelling      HPI Patient is in today for an office visit.   He complains of pain and swelling in the heel of his left foot beginning last week. He believes it may be tendonitis or plantar fasciitis. He reports pain when sitting down or getting up. He has been taking Voltaren and states it has been helping. He went on a work trip to [place] over the weekend and did a lot of walking while there. He has received injections in his foot before, which he notes did not help.  He also complains of his right tear feeling "clogged up." He has experienced this before in his left ear in the fall of 2023. He has been using ear drops.   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   Depression    GERD (gastroesophageal reflux disease)    Glaucoma    bilateral   Hyperlipidemia    Hypertension    Prostate cancer ([place]) 2016   Urinary retention     Past Surgical History:  Procedure Laterality Date   COLONOSCOPY     HIP RESURFACING Right 11/04/2015   inguinal Herniorrhaphy     left   KNEE ARTHROSCOPY  04/2005   right   KNEE SURGERY  09-2009   partial replacemente , R   PARTIAL KNEE ARTHROPLASTY Left 10.10.16   PROSTATE BIOPSY  02/2016   TOTAL HIP ARTHROPLASTY  12/16/09   left hip   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     benign/fatty tumor    Family History  Problem Relation Age of Onset   Alzheimer's disease Mother 66   Cancer Father 50       sm cell carcinoma lung   Cancer  Sister 47       breast   Breast cancer Sister    Hyperlipidemia Paternal Grandfather    Colon cancer Neg Hx    Stomach cancer Neg Hx    Esophageal cancer Neg Hx    Rectal cancer Neg Hx     Social History   Socioeconomic History   Marital status: Married    Spouse name: Not on file   Number of children: Not on file   Years of education: Not on file   Highest education level: Not on file  Occupational History   Occupation: Teacher, early years/pre  Tobacco Use   Smoking status: Former    Years: .2    Types: Cigarettes    Quit date: 09/15/1967    Years since quitting: 54.5   Smokeless tobacco: Never  Vaping Use   Vaping Use: Never used  Substance and Sexual Activity   Alcohol use: Not Currently    Comment: occasionally   Drug use: No   Sexual activity: Yes  Other Topics Concern   Not on file  Social History Narrative   Not on file   Social Determinants of Health  Financial Resource Strain: Not on file  Food Insecurity: Not on file  Transportation Needs: Not on file  Physical Activity: Not on file  Stress: Not on file  Social Connections: Not on file  Intimate Partner Violence: Not on file    Outpatient Medications Prior to Visit  Medication Sig Dispense Refill   aspirin 81 MG tablet Take 81 mg by mouth daily.     Calcium Carbonate-Vit D-Min (CALCIUM 1200 PO) Take 4 tablets by mouth daily. Raw calcium     Cholecalciferol (VITAMIN D3) 2000 units TABS Take 1 capsule by mouth daily.     GLUCOSAMINE-CHONDROITIN-MSM PO Take 3,200 mg by mouth 2 (two) times daily.     multivitamin (THERAGRAN) per tablet Take 1 tablet by mouth daily.     Omega 3 1200 MG CAPS Take 1 capsule by mouth 2 (two) times daily.     pantoprazole (PROTONIX) 40 MG tablet Take 1 tablet by mouth daily.     rosuvastatin (CRESTOR) 20 MG tablet Take 1 tablet (20 mg total) by mouth daily. 30 tablet 0   sertraline (ZOLOFT) 100 MG tablet Take 100 mg by mouth daily.     timolol (TIMOPTIC) 0.5 % ophthalmic solution  Place 1 drop into the left eye 2 (two) times daily.      TURMERIC PO Take 940 mg by mouth 2 (two) times daily.     azithromycin (ZITHROMAX) 250 MG tablet Take 2 tabs the first day and then 1 tab daily until you run out. 6 tablet 0   lisinopril (ZESTRIL) 20 MG tablet Take 1 tablet (20 mg total) by mouth daily. Pt needs office visit for further refills. 30 tablet 0   No facility-administered medications prior to visit.    No Known Allergies  Review of Systems  Constitutional:  Negative for fever and malaise/fatigue.  HENT:  Negative for congestion.   Eyes:  Negative for blurred vision.  Respiratory:  Negative for shortness of breath.   Cardiovascular:  Negative for chest pain, palpitations and leg swelling.  Gastrointestinal:  Negative for abdominal pain, blood in stool and nausea.  Genitourinary:  Negative for dysuria and frequency.  Musculoskeletal:  Negative for falls.       (+) pain in left foot/heel  Skin:  Negative for rash.  Neurological:  Negative for dizziness, loss of consciousness and headaches.  Endo/Heme/Allergies:  Negative for environmental allergies.  Psychiatric/Behavioral:  Negative for depression. The patient is not nervous/anxious.        Objective:    Physical Exam Vitals and nursing note reviewed.  Constitutional:      Appearance: He is well-developed.  HENT:     Head: Normocephalic and atraumatic.  Eyes:     Pupils: Pupils are equal, round, and reactive to light.  Neck:     Thyroid: No thyromegaly.  Cardiovascular:     Rate and Rhythm: Normal rate and regular rhythm.     Heart sounds: No murmur heard. Pulmonary:     Effort: Pulmonary effort is normal. No respiratory distress.     Breath sounds: Normal breath sounds. No wheezing or rales.  Chest:     Chest wall: No tenderness.  Musculoskeletal:     Cervical back: Normal range of motion and neck supple.     Right hip: Tenderness present. Normal range of motion. Normal strength.     Left hip:  Tenderness present. Normal range of motion. Normal strength.     Right foot: Bony tenderness present. No swelling.  Left foot: Bony tenderness present. No swelling.  Skin:    General: Skin is warm and dry.  Neurological:     Mental Status: He is alert and oriented to person, place, and time.  Psychiatric:        Behavior: Behavior normal.        Thought Content: Thought content normal.        Judgment: Judgment normal.     BP 134/80 (BP Location: Left Arm, Patient Position: Sitting, Cuff Size: Normal)   Pulse 84   Temp 98.7 F (37.1 C) (Oral)   Resp 18   Ht 5\' 10"  (1.778 m)   Wt 189 lb 3.2 oz (85.8 kg)   SpO2 97%   BMI 27.15 kg/m  Wt Readings from Last 3 Encounters:  03/20/22 189 lb 3.2 oz (85.8 kg)  09/25/21 186 lb 3.2 oz (84.5 kg)  06/16/21 191 lb (86.6 kg)       Assessment & Plan:  Hyperlipidemia, unspecified hyperlipidemia type -     Comprehensive metabolic panel -     Lipid panel  Primary hypertension -     Lisinopril; Take 1 tablet (20 mg total) by mouth daily.  Dispense: 90 tablet; Refill: 3 -     Comprehensive metabolic panel -     Lipid panel  Plantar fasciitis Assessment & Plan: Refer to podiatry   Orders: -     Ambulatory referral to Podiatry  Morton's neuralgia, left Assessment & Plan: Refer to podiatry   Orders: -     Ambulatory referral to Podiatry     I,Rachel Rivera,acting as a scribe for Ann Held, DO.,have documented all relevant documentation on the behalf of Ann Held, DO,as directed by  Ann Held, DO while in the presence of Ann Held, DO.   I, Ann Held, DO, personally preformed the services described in this documentation.  All medical record entries made by the scribe were at my direction and in my presence.  I have reviewed the chart and discharge instructions (if applicable) and agree that the record reflects my personal performance and is accurate and complete. 03/22/22    Ann Held, DO

## 2022-03-20 NOTE — Patient Instructions (Signed)
Plantar Fasciitis  Plantar fasciitis is a painful foot condition that affects the heel. It occurs when the band of tissue that connects the toes to the heel bone (plantar fascia) becomes irritated. This can happen as the result of exercising too much or doing other repetitive activities (overuse injury). Plantar fasciitis can cause mild irritation to severe pain that makes it difficult to walk or move. The pain is usually worse in the morning after sleeping, or after sitting or lying down for a period of time. Pain may also be worse after long periods of walking or standing. What are the causes? This condition may be caused by: Standing for long periods of time. Wearing shoes that do not have good arch support. Doing activities that put stress on joints (high-impact activities). This includes ballet and exercise that makes your heart beat faster (aerobic exercise), such as running. Being overweight. An abnormal way of walking (gait). Tight muscles in the back of your lower leg (calf). High arches in your feet or flat feet. Starting a new athletic activity. What are the signs or symptoms? The main symptom of this condition is heel pain. Pain may get worse after the following: Taking the first steps after a time of rest, especially in the morning after awakening, or after you have been sitting or lying down for a while. Long periods of standing still. Pain may decrease after 30-45 minutes of activity, such as gentle walking. How is this diagnosed? This condition may be diagnosed based on your medical history, a physical exam, and your symptoms. Your health care provider will check for: A tender area on the bottom of your foot. A high arch in your foot or flat feet. Pain when you move your foot. Difficulty moving your foot. You may have imaging tests to confirm the diagnosis, such as: X-rays. Ultrasound. MRI. How is this treated? Treatment for plantar fasciitis depends on how severe your  condition is. Treatment may include: Rest, ice, pressure (compression), and raising (elevating) the affected foot. This is called RICE therapy. Your health care provider may recommend RICE therapy along with over-the-counter pain medicines to manage your pain. Exercises to stretch your calves and your plantar fascia. A splint that holds your foot in a stretched, upward position while you sleep (night splint). Physical therapy to relieve symptoms and prevent problems in the future. Injections of steroid medicine (cortisone) to relieve pain and inflammation. Stimulating your plantar fascia with electrical impulses (extracorporeal shock wave therapy). This is usually the last treatment option before surgery. Surgery, if other treatments have not worked after 12 months. Follow these instructions at home: Managing pain, stiffness, and swelling  If directed, put ice on the painful area. To do this: Put ice in a plastic bag, or use a frozen bottle of water. Place a towel between your skin and the bag or bottle. Roll the bottom of your foot over the bag or bottle. Do this for 20 minutes, 2-3 times a day. Wear athletic shoes that have air-sole or gel-sole cushions, or try soft shoe inserts that are designed for plantar fasciitis. Elevate your foot above the level of your heart while you are sitting or lying down. Activity Avoid activities that cause pain. Ask your health care provider what activities are safe for you. Do physical therapy exercises and stretches as told by your health care provider. Try activities and forms of exercise that are easier on your joints (low impact). Examples include swimming, water aerobics, and biking. General instructions Take over-the-counter   and prescription medicines only as told by your health care provider. Wear a night splint while sleeping, if told by your health care provider. Loosen the splint if your toes tingle, become numb, or turn cold and blue. Maintain a  healthy weight, or work with your health care provider to lose weight as needed. Keep all follow-up visits. This is important. Contact a health care provider if you have: Symptoms that do not go away with home treatment. Pain that gets worse. Pain that affects your ability to move or do daily activities. Summary Plantar fasciitis is a painful foot condition that affects the heel. It occurs when the band of tissue that connects the toes to the heel bone (plantar fascia) becomes irritated. Heel pain is the main symptom of this condition. It may get worse after exercising too much or standing still for a long time. Treatment varies, but it usually starts with rest, ice, pressure (compression), and raising (elevating) the affected foot. This is called RICE therapy. Over-the-counter medicines can also be used to manage pain. This information is not intended to replace advice given to you by your health care provider. Make sure you discuss any questions you have with your health care provider. Document Revised: 04/10/2019 Document Reviewed: 04/10/2019 Elsevier Patient Education  2023 Else[employer] Inc.  

## 2022-03-21 LAB — LIPID PANEL
Cholesterol: 183 mg/dL (ref <200)
HDL: 46 mg/dL (ref >=40)
LDL Cholesterol (Calc): 110 mg/dL (calc) — ABNORMAL HIGH
Non-HDL Cholesterol (Calc): 137 mg/dL (calc) — ABNORMAL HIGH (ref <130)
Total CHOL/HDL Ratio: 4 (calc) (ref <5.0)
Triglycerides: 154 mg/dL — ABNORMAL HIGH (ref <150)

## 2022-03-21 LAB — COMPREHENSIVE METABOLIC PANEL
AG Ratio: 2.3 (calc) (ref 1.0–2.5)
ALT: 15 U/L (ref 9–46)
AST: 20 U/L (ref 10–35)
Albumin: 4.4 g/dL (ref 3.6–5.1)
Alkaline phosphatase (APISO): 50 U/L (ref 35–144)
BUN: 22 mg/dL (ref 7–25)
CO2: 28 mmol/L (ref 20–32)
Calcium: 8.9 mg/dL (ref 8.6–10.3)
Chloride: 105 mmol/L (ref 98–110)
Creat: 1.03 mg/dL (ref 0.70–1.28)
Globulin: 1.9 g/dL (calc) (ref 1.9–3.7)
Glucose, Bld: 105 mg/dL — ABNORMAL HIGH (ref 65–99)
Potassium: 4.2 mmol/L (ref 3.5–5.3)
Sodium: 141 mmol/L (ref 135–146)
Total Bilirubin: 0.8 mg/dL (ref 0.2–1.2)
Total Protein: 6.3 g/dL (ref 6.1–8.1)

## 2022-03-22 ENCOUNTER — Other Ambulatory Visit: Payer: Self-pay | Admitting: Family Medicine

## 2022-03-22 DIAGNOSIS — G5762 Lesion of plantar nerve, left lower limb: Secondary | ICD-10-CM | POA: Insufficient documentation

## 2022-03-22 DIAGNOSIS — E785 Hyperlipidemia, unspecified: Secondary | ICD-10-CM

## 2022-03-22 DIAGNOSIS — M722 Plantar fascial fibromatosis: Secondary | ICD-10-CM | POA: Insufficient documentation

## 2022-03-22 NOTE — Assessment & Plan Note (Signed)
Refer to podiatry

## 2022-03-23 ENCOUNTER — Encounter: Payer: Self-pay | Admitting: Family Medicine

## 2022-03-26 ENCOUNTER — Ambulatory Visit: Payer: Medicare HMO | Admitting: Podiatry

## 2022-03-26 DIAGNOSIS — M722 Plantar fascial fibromatosis: Secondary | ICD-10-CM | POA: Diagnosis not present

## 2022-03-26 DIAGNOSIS — M7752 Other enthesopathy of left foot: Secondary | ICD-10-CM | POA: Diagnosis not present

## 2022-03-26 NOTE — Progress Notes (Signed)
Subjective:  Patient ID: Antonio Stephens, male    DOB: 1945-04-08,  MRN: [id number]  Chief Complaint  Patient presents with   Plantar Fasciitis    77 y.o. male presents with the above complaint.  Patient presents with left heel pain that has been going for quite some time is progressive gotten worse worse with ambulation worse with pressure he has not seen MRIs prior to seeing me for this pain scale 7 out of 10 dull achy in nature secondary complaint of left second metatarsophalangeal joint hurts with ambulation worse with pressure he would like to discuss treatment options for this is not a diabetic pain scale 7 out of 10 dull achy in nature.   Review of Systems: Negative except as noted in the HPI. Denies N/V/F/Ch.  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 Raw calcium, Disp: , Rfl:    Cholecalciferol (VITAMIN D3) 2000 units TABS, Take 1 capsule by mouth daily., Disp: , Rfl:    GLUCOSAMINE-CHONDROITIN-MSM PO, Take 3,200 mg by mouth 2 (two) times daily., Disp: , Rfl:    lisinopril (ZESTRIL) 20 MG tablet, Take 1 tablet (20 mg total) by mouth daily., Disp: 90 tablet, Rfl: 3   multivitamin (THERAGRAN) per tablet, Take 1 tablet by mouth daily., Disp: , Rfl:    Omega 3 1200 MG CAPS, Take 1 capsule by mouth 2 (two) times daily., Disp: , Rfl:    pantoprazole (PROTONIX) 40 MG tablet, Take 1 tablet by mouth daily., Disp: , Rfl:    rosuvastatin (CRESTOR) 20 MG tablet, Take 1 tablet (20 mg total)  by mouth daily., Disp: 30 tablet, Rfl: 0   sertraline (ZOLOFT) 100 MG tablet, Take 100 mg by mouth daily., Disp: , Rfl:    timolol (TIMOPTIC) 0.5 % ophthalmic solution, Place 1 drop into the left eye 2 (two) times daily. , Disp: , Rfl:    TURMERIC PO, Take 940 mg by mouth 2 (two) times daily., Disp: , Rfl:   Social History   Tobacco Use  Smoking Status Former   Years: .2   Types: Cigarettes   Quit date: 09/15/1967   Years since quitting: 54.5  Smokeless Tobacco Never    No Known Allergies Objective:  There were no vitals filed for this visit. There is no height or weight on file to calculate BMI. Constitutional Well developed. Well nourished.  Vascular Dorsalis pedis pulses palpable bilaterally. Posterior tibial pulses palpable bilaterally. Capillary refill normal to all digits.  No cyanosis or clubbing noted. Pedal hair growth normal.  Neurologic Normal speech. Oriented to person, place, and time. Epicritic sensation to light touch grossly present bilaterally.  Dermatologic Nails well groomed and normal in appearance. No open wounds. No skin lesions.  Orthopedic: Normal joint ROM without pain or crepitus bilaterally. No visible deformities. Tender to palpation at the calcaneal tuber left. No pain with calcaneal squeeze left. Ankle ROM diminished range of motion  left. Silfverskiold Test: positive left. Pain on palpation left second metatarsophalangeal joint pain with range of motion of the joint no deep intra-articular pain noted.   Radiographs: None  Assessment:   1. Plantar fasciitis of left foot   2. Capsulitis of metatarsophalangeal (MTP) joint of left foot    Plan:  Patient was evaluated and treated and all questions answered.  Left second MTP capsulitis -I explained to the patient the etiology of capsulitis and worse treatment options were discussed given the amount of pain that is having benefit from a steroid injection of decrease inflammatory component  associate with pain.  Patient agrees with plan like to proceed with steroid injection -A steroid injection was performed at left second MTP using 1% plain Lidocaine and 10 mg of Kenalog. This was well tolerated.   Plantar Fasciitis, left - XR reviewed as above.  - Educated on icing and stretching. Instructions given.  - Injection delivered to the plantar fascia as below. - DME: Plantar fascial brace dispensed to support the medial longitudinal arch of the foot and offload pressure from the heel and prevent arch collapse during weightbearing - Pharmacologic management: None    Procedure: Injection Tendon/Ligament Location: Left plantar fascia at the glabrous junction; medial approach. Skin Prep: alcohol Injectate: 0.5 cc 0.5% marcaine plain, 0.5 cc of 1% Lidocaine, 0.5 cc kenalog 10. Disposition: Patient tolerated procedure well. Injection site dressed with a band-aid.  No follow-ups on file.

## 2022-04-30 ENCOUNTER — Ambulatory Visit: Payer: Medicare HMO | Admitting: Podiatry

## 2022-04-30 DIAGNOSIS — M7752 Other enthesopathy of left foot: Secondary | ICD-10-CM | POA: Diagnosis not present

## 2022-04-30 DIAGNOSIS — M722 Plantar fascial fibromatosis: Secondary | ICD-10-CM | POA: Diagnosis not present

## 2022-04-30 NOTE — Progress Notes (Signed)
Subjective:  Patient ID: Antonio Stephens, male    DOB: 1945/04/26,  MRN: [id number]  Chief Complaint  Patient presents with   Plantar Fasciitis    Pt stated that it is doing a Boehm better still has some soreness in his heel     77 y.o. male presents with the above complaint.  Patient presents for follow-up of left Planter fasciitis.  He states is doing better the injection helped.  His second MTP pain is completely gone.  Denies any other acute complaints would like to discuss next treatment plan  Review of Systems: Negative except as noted in the HPI. Denies N/V/F/Ch.  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 Raw calcium, Disp: , Rfl:    Cholecalciferol (VITAMIN D3) 2000 units TABS, Take 1 capsule by mouth daily., Disp: , Rfl:    GLUCOSAMINE-CHONDROITIN-MSM PO, Take 3,200 mg by mouth 2 (two) times daily., Disp: , Rfl:    lisinopril (ZESTRIL) 20 MG tablet, Take 1 tablet (20 mg total) by mouth daily., Disp: 90 tablet, Rfl: 3   multivitamin (THERAGRAN) per tablet, Take 1 tablet by mouth daily., Disp: , Rfl:    Omega 3 1200 MG CAPS, Take 1 capsule by mouth 2 (two) times daily., Disp: , Rfl:    pantoprazole (PROTONIX) 40 MG tablet, Take 1 tablet by mouth daily., Disp: , Rfl:    rosuvastatin (CRESTOR) 20 MG tablet, Take 1 tablet (20 mg total) by mouth daily., Disp: 30 tablet, Rfl: 0   sertraline (ZOLOFT) 100 MG tablet, Take 100 mg by mouth daily., Disp: , Rfl:    timolol (TIMOPTIC) 0.5 %  ophthalmic solution, Place 1 drop into the left eye 2 (two) times daily. , Disp: , Rfl:    TURMERIC PO, Take 940 mg by mouth 2 (two) times daily., Disp: , Rfl:   Social History   Tobacco Use  Smoking Status Former   Years: .2   Types: Cigarettes   Quit date: 09/15/1967   Years since quitting: 54.6  Smokeless Tobacco Never    No Known Allergies Objective:  There were no vitals filed for this visit. There is no height or weight on file to calculate BMI. Constitutional Well developed. Well nourished.  Vascular Dorsalis pedis pulses palpable bilaterally. Posterior tibial pulses palpable bilaterally. Capillary refill normal to all digits.  No cyanosis or clubbing noted. Pedal hair growth normal.  Neurologic Normal speech. Oriented to person, place, and time. Epicritic sensation to light touch grossly present bilaterally.  Dermatologic Nails well groomed and normal in appearance. No open wounds. No skin lesions.  Orthopedic: Normal joint ROM without pain or crepitus bilaterally. No visible deformities. Tender to palpation at the calcaneal tuber left. No pain with calcaneal squeeze left. Ankle ROM diminished range of motion left. Silfverskiold Test: positive left. No further pain on palpation left second metatarsophalangeal joint pain with range of motion of the joint no deep  intra-articular pain noted.   Radiographs: None  Assessment:   1. Capsulitis of metatarsophalangeal (MTP) joint of left foot   2. Plantar fasciitis of left foot     Plan:  Patient was evaluated and treated and all questions answered.  Left second MTP capsulitis -Clinically doing a lot better.  Will hold off on further injection.  I discussed shoe gear modification with him.  He states understanding   Plantar Fasciitis, left - XR reviewed as above.  - Educated on icing and stretching. Instructions given.  -Second injection delivered to the plantar fascia as below. - DME: Plantar fascial brace  dispensed to support the medial longitudinal arch of the foot and offload pressure from the heel and prevent arch collapse during weightbearing - Pharmacologic management: None    Procedure: Injection Tendon/Ligament Location: Left plantar fascia at the glabrous junction; medial approach. Skin Prep: alcohol Injectate: 0.5 cc 0.5% marcaine plain, 0.5 cc of 1% Lidocaine, 0.5 cc kenalog 10. Disposition: Patient tolerated procedure well. Injection site dressed with a band-aid.  No follow-ups on file.

## 2022-05-19 ENCOUNTER — Other Ambulatory Visit: Payer: Self-pay | Admitting: Family Medicine

## 2022-06-03 ENCOUNTER — Emergency Department (HOSPITAL_BASED_OUTPATIENT_CLINIC_OR_DEPARTMENT_OTHER)
Admission: EM | Admit: 2022-06-03 | Discharge: 2022-06-03 | Disposition: A | Discharge status: Home / Self Care | Payer: Medicare HMO | Attending: Emergency Medicine | Admitting: Emergency Medicine

## 2022-06-03 ENCOUNTER — Emergency Department (HOSPITAL_BASED_OUTPATIENT_CLINIC_OR_DEPARTMENT_OTHER): Payer: Medicare HMO

## 2022-06-03 ENCOUNTER — Encounter (HOSPITAL_BASED_OUTPATIENT_CLINIC_OR_DEPARTMENT_OTHER): Payer: Self-pay

## 2022-06-03 ENCOUNTER — Other Ambulatory Visit: Payer: Self-pay

## 2022-06-03 DIAGNOSIS — R0602 Shortness of breath: Secondary | ICD-10-CM

## 2022-06-03 DIAGNOSIS — R051 Acute cough: Secondary | ICD-10-CM

## 2022-06-03 DIAGNOSIS — Z8546 Personal history of malignant neoplasm of prostate: Secondary | ICD-10-CM | POA: Insufficient documentation

## 2022-06-03 DIAGNOSIS — R509 Fever, unspecified: Secondary | ICD-10-CM | POA: Diagnosis not present

## 2022-06-03 DIAGNOSIS — Z7982 Long term (current) use of aspirin: Secondary | ICD-10-CM | POA: Insufficient documentation

## 2022-06-03 DIAGNOSIS — I10 Essential (primary) hypertension: Secondary | ICD-10-CM | POA: Diagnosis not present

## 2022-06-03 DIAGNOSIS — U071 COVID-19: Secondary | ICD-10-CM | POA: Insufficient documentation

## 2022-06-03 DIAGNOSIS — Z8673 Personal history of transient ischemic attack (TIA), and cerebral infarction without residual deficits: Secondary | ICD-10-CM | POA: Insufficient documentation

## 2022-06-03 DIAGNOSIS — Z79899 Other long term (current) drug therapy: Secondary | ICD-10-CM | POA: Insufficient documentation

## 2022-06-03 DIAGNOSIS — R059 Cough, unspecified: Secondary | ICD-10-CM | POA: Diagnosis not present

## 2022-06-03 LAB — COMPREHENSIVE METABOLIC PANEL
ALT: 16 U/L (ref 0–44)
AST: 21 U/L (ref 15–41)
Albumin: 3.9 g/dL (ref 3.5–5.0)
Alkaline Phosphatase: 48 U/L (ref 38–126)
Anion gap: 9 (ref 5–15)
BUN: 21 mg/dL (ref 8–23)
CO2: 26 mmol/L (ref 22–32)
Calcium: 8.8 mg/dL — ABNORMAL LOW (ref 8.9–10.3)
Chloride: 97 mmol/L — ABNORMAL LOW (ref 98–111)
Creatinine, Ser: 1.01 mg/dL (ref 0.61–1.24)
GFR, Estimated: >60 mL/min (ref >60)
Glucose, Bld: 125 mg/dL — ABNORMAL HIGH (ref 70–99)
Potassium: 3.9 mmol/L (ref 3.5–5.1)
Sodium: 132 mmol/L — ABNORMAL LOW (ref 135–145)
Total Bilirubin: 0.9 mg/dL (ref 0.3–1.2)
Total Protein: 6.7 g/dL (ref 6.5–8.1)

## 2022-06-03 LAB — CBC WITH DIFFERENTIAL/PLATELET
Abs Immature Granulocytes: 0.03 10*3/uL (ref 0.00–0.07)
Basophils Absolute: 0.1 10*3/uL (ref 0.0–0.1)
Basophils Relative: 1 %
Eosinophils Absolute: 0 10*3/uL (ref 0.0–0.5)
Eosinophils Relative: 0 %
HCT: 39.2 % (ref 39.0–52.0)
Hemoglobin: 13.3 g/dL (ref 13.0–17.0)
Immature Granulocytes: 0 %
Lymphocytes Relative: 10 %
Lymphs Abs: 0.8 10*3/uL (ref 0.7–4.0)
MCH: 31.4 pg (ref 26.0–34.0)
MCHC: 33.9 g/dL (ref 30.0–36.0)
MCV: 92.5 fL (ref 80.0–100.0)
Monocytes Absolute: 0.9 10*3/uL (ref 0.1–1.0)
Monocytes Relative: 11 %
Neutro Abs: 6.1 10*3/uL (ref 1.7–7.7)
Neutrophils Relative %: 78 %
Platelets: 165 10*3/uL (ref 150–400)
RBC: 4.24 MIL/uL (ref 4.22–5.81)
RDW: 13.5 % (ref 11.5–15.5)
WBC: 7.9 10*3/uL (ref 4.0–10.5)
nRBC: 0 % (ref 0.0–0.2)

## 2022-06-03 LAB — URINALYSIS, W/ REFLEX TO CULTURE (INFECTION SUSPECTED)
Bilirubin Urine: NEGATIVE
Glucose, UA: NEGATIVE mg/dL
Hgb urine dipstick: NEGATIVE
Ketones, ur: NEGATIVE mg/dL
Leukocytes,Ua: NEGATIVE
Nitrite: NEGATIVE
Protein, ur: 100 mg/dL — AB
Specific Gravity, Urine: 1.025 (ref 1.005–1.030)
WBC, UA: NONE SEEN WBC/hpf (ref 0–5)
pH: 6 (ref 5.0–8.0)

## 2022-06-03 LAB — LACTIC ACID, PLASMA: Lactic Acid, Venous: 0.8 mmol/L (ref 0.5–1.9)

## 2022-06-03 LAB — SARS CORONAVIRUS 2 BY RT PCR: SARS Coronavirus 2 by RT PCR: POSITIVE — AB

## 2022-06-03 LAB — LIPASE, BLOOD: Lipase: 36 U/L (ref 11–51)

## 2022-06-03 MED ORDER — ONDANSETRON 4 MG PO TBDP: 4.0000 mg | ORAL_TABLET | Freq: Three times a day (TID) | ORAL | 0 refills | Status: DC | PRN

## 2022-06-03 MED ORDER — SODIUM CHLORIDE 0.9 % IV BOLUS
500.0000 mL | Freq: Once | INTRAVENOUS | Status: AC
  Administered 2022-06-03: 500 mL via INTRAVENOUS

## 2022-06-03 MED ORDER — BENZONATATE 100 MG PO CAPS: 100.0000 mg | ORAL_CAPSULE | Freq: Three times a day (TID) | ORAL | 0 refills | Status: DC | PRN

## 2022-06-03 MED ORDER — PAXLOVID (300/100) 20 X 150 MG & 10 X 100MG PO TBPK: 3.0000 | ORAL_TABLET | Freq: Two times a day (BID) | ORAL | 0 refills | Status: AC

## 2022-06-03 NOTE — ED Provider Notes (Signed)
[name] AT [facility] Provider Note   CSN: [id number] Arrival date & time: 06/03/22  1819     History  Chief Complaint  Patient presents with   Nasal Congestion   Cough   Weakness    Antonio Stephens is a 77 y.o. male.  The history is provided by the patient and medical records. No language interpreter was used.  Cough Cough characteristics:  Non-productive Sputum characteristics:  Nondescript Severity:  Severe Onset quality:  Gradual Duration:  2 days Timing:  Constant Chronicity:  New Context: sick contacts (wife)   Relieved by:  Nothing Worsened by:  Nothing Associated symptoms: chills, fever and rhinorrhea   Associated symptoms: no chest pain, no diaphoresis, no headaches, no rash, no shortness of breath and no wheezing   Weakness Associated symptoms: cough, fever, frequency and nausea   Associated symptoms: no abdominal pain, no chest pain, no diarrhea, no dizziness, no dysuria, no headaches and no shortness of breath        Home Medications Prior to Admission medications   Medication Sig Start Date End Date Taking? Authorizing Provider  aspirin 81 MG tablet Take 81 mg by mouth daily.    [provider]  Calcium Carbonate-Vit D-Min (CALCIUM 1200 PO) Take 4 tablets by mouth daily. Raw calcium    [provider]  Cholecalciferol (VITAMIN D3) 2000 units TABS Take 1 capsule by mouth daily.    [provider]  GLUCOSAMINE-CHONDROITIN-MSM PO Take 3,200 mg by mouth 2 (two) times daily.    [provider]  lisinopril (ZESTRIL) 20 MG tablet Take 1 tablet (20 mg total) by mouth daily. 03/20/22   Donato Schultz, DO  multivitamin [facility]) per tablet Take 1 tablet by mouth daily.    [provider]  Omega 3 1200 MG CAPS Take 1 capsule by mouth 2 (two) times daily.    [provider]  pantoprazole (PROTONIX) 40 MG tablet Take 1 tablet by mouth daily. 10/15/20   [provider]  rosuvastatin (CRESTOR) 20 MG tablet TAKE 1 TABLET(20 MG) BY MOUTH DAILY 05/20/22   Zola Button, Grayling Congress, DO  sertraline (ZOLOFT) 100 MG tablet Take 100 mg by mouth daily.    [provider]  timolol (TIMOPTIC) 0.5 % ophthalmic solution Place 1 drop into the left eye 2 (two) times daily.  05/01/13   [provider]  TURMERIC PO Take 940 mg by mouth 2 (two) times daily.    [provider]      Allergies    Patient has no known allergies.    Review of Systems   Review of Systems  Constitutional:  Positive for chills, fatigue and fever. Negative for diaphoresis.  HENT:  Positive for rhinorrhea. Negative for congestion and voice change.   Respiratory:  Positive for cough. Negative for shortness of breath and wheezing.   Cardiovascular:  Negative for chest pain, palpitations and leg swelling.  Gastrointestinal:  Positive for nausea. Negative for abdominal pain, constipation and diarrhea.  Genitourinary:  Positive for frequency. Negative for dysuria.  Musculoskeletal:  Negative for back pain, gait problem (chronic and unchanged per pt), neck pain and neck stiffness.  Skin:  Negative for rash.  Neurological:  Positive for weakness. Negative for dizziness, facial asymmetry, light-headedness, numbness and headaches.  Psychiatric/Behavioral:  Negative for agitation.   All other systems reviewed and are negative.   Physical Exam Updated Vital Signs BP 99/65 (BP Location: Right Arm)   Pulse 97  Temp (!) 101 F (38.3 C)   Resp 20   Ht 6' (1.829 m)   Wt 83.9 kg   SpO2 95%   BMI 25.09 kg/m  Physical Exam Vitals and nursing note reviewed.  Constitutional:      General: He is not in acute distress.    Appearance: He is well-developed. He is not ill-appearing, toxic-appearing or diaphoretic.  HENT:     Head: Normocephalic and atraumatic.     Nose: No congestion or rhinorrhea.     Mouth/Throat:     Mouth: Mucous membranes are moist.     Pharynx:  No oropharyngeal exudate or posterior oropharyngeal erythema.  Eyes:     Extraocular Movements: Extraocular movements intact.     Conjunctiva/sclera: Conjunctivae normal.     Pupils: Pupils are equal, round, and reactive to light.  Cardiovascular:     Rate and Rhythm: Normal rate and regular rhythm.     Heart sounds: No murmur heard. Pulmonary:     Effort: Pulmonary effort is normal. No respiratory distress.     Breath sounds: Normal breath sounds. No wheezing, rhonchi or rales.  Chest:     Chest wall: No tenderness.  Abdominal:     General: Abdomen is flat. There is no distension.     Palpations: Abdomen is soft.     Tenderness: There is no abdominal tenderness. There is no right CVA tenderness, left CVA tenderness, guarding or rebound.  Musculoskeletal:        General: No swelling or tenderness.     Cervical back: Neck supple. No tenderness.     Right lower leg: No edema.     Left lower leg: No edema.  Skin:    General: Skin is warm and dry.     Capillary Refill: Capillary refill takes less than 2 seconds.     Findings: No erythema or rash.  Neurological:     General: No focal deficit present.     Mental Status: He is alert.  Psychiatric:        Mood and Affect: Mood normal.     ED Results / Procedures / Treatments   Labs (all labs ordered are listed, but only abnormal results are displayed) Labs Reviewed  SARS CORONAVIRUS 2 BY RT PCR - Abnormal; Notable for the following components:      Result Value   SARS Coronavirus 2 by RT PCR POSITIVE (*)    All other components within normal limits  COMPREHENSIVE METABOLIC PANEL - Abnormal; Notable for the following components:   Sodium 132 (*)    Chloride 97 (*)    Glucose, Bld 125 (*)    Calcium 8.8 (*)    All other components within normal limits  URINALYSIS, W/ REFLEX TO CULTURE (INFECTION SUSPECTED) - Abnormal; Notable for the following components:   Protein, ur 100 (*)    Bacteria, UA RARE (*)    All other components  within normal limits  CBC WITH DIFFERENTIAL/PLATELET  LIPASE, BLOOD  LACTIC ACID, PLASMA    EKG EKG Interpretation  Date/Time:  Wednesday Jun 03 2022 20:01:56 EDT Ventricular Rate:  91 PR Interval:  216 QRS Duration: 106 QT Interval:  364 QTC Calculation: 448 R Axis:   -72 Text Interpretation: Sinus rhythm Prolonged PR interval Incomplete RBBB and LAFB Consider right ventricular hypertrophy when comapred to prior, more wandering baseline. No STEMI Confirmed by Theda Belfast ([id number]) on 06/03/2022 8:50:37 PM  Radiology DG Chest Portable 1 View  Result Date: 06/03/2022 CLINICAL DATA:  Cough fever EXAM: PORTABLE CHEST 1 VIEW COMPARISON:  04/08/2020 FINDINGS: The heart size and mediastinal contours are within normal limits. Both lungs are clear. The visualized skeletal structures are unremarkable. Aortic atherosclerosis. IMPRESSION: No active disease. Electronically Signed   By: Jasmine Pang M.D.   On: 06/03/2022 20:11    Procedures Procedures    Medications Ordered in ED Medications  sodium chloride 0.9 % bolus 500 mL (0 mLs Intravenous Stopped 06/03/22 2134)    ED Course/ Medical Decision Making/ A&P                             Medical Decision Making Amount and/or Complexity of Data Reviewed Labs: ordered. Radiology: ordered.  Risk Prescription drug management.    Antonio Stephens is a 77 y.o. male with a past medical history significant for hypertension, hyperlipidemia, previous stroke with some chronic waxing and waning balance troubles, GERD, and previous prostate cancer who presents with 2 days of congestion, cough, rhinorrhea, chills, nausea, vomiting, fatigue, and continued balance troubles.  According to patient, he has been with his wife who has also had URI symptoms for the last few days.  Patient reports today he was having more difficulty with ambulation which she chronically has been battling with physical therapy.  He does not think that it is remarkably worse  but with his history of previous stroke he was concerned about that.  He does report he had a nonproductive cough with subjective fevers and chills.  Temperature was 101 on arrival.  He reports he has not has much to eat or drink today with vomiting and denies constipation or diarrhea.  Reports she has been urinating more but denies dysuria.  Denies any extremity pains.  Denies headache or neck pain.  Denies chest pain abdominal pain or back pain or flank pain.  Denies trauma.  On exam, lungs clear.  Chest nontender.  Abdomen nontender.  No focal neurologic deficits.  Intact sensation and strength and pulses in extremities.  Normal finger-nose-finger testing and normal heel shin testing.  Clear speech.  Normal sensation of the face.  Pupils symmetric and reactive with normal extraocular movements.  Patient otherwise well-appearing but is warm to the touch.  Patient had a COVID swab in triage that was positive.  Suspect COVID causing his symptoms and is likely causing some dehydration and fatigue as well.  Given his lack of new focal neurologic deficits we agreed to hold on imaging of the head at this time.  Suspect mild recrudescence of his chronic unsteadiness in the setting of his new COVID diagnosis.  Without focal finding on initial exam we agreed to hold on head imaging or transfer for MRI.  Will have her get chest x-ray with his cough, urinalysis, and some screening labs.  Will ambulate to make sure he is not hypoxic.  Oxygen saturation might was initially seen and was in the low 90s at rest.  Will make sure he does not get in the 80s.  Will give a small fluids as he does have dry mucous membranes and has had the nausea and vomiting.  Anticipate reassessment after workup to determine disposition in the setting of COVID-19.  Patient's workup returned and aside from the COVID-positive finding workup was relatively reassuring.  Patient did have episodes of hypoxia going down to 86 at the lowest but is  now maintaining sats in the 90s.  We offered admission for oxygen supplementation and COVID monitoring  but he would like to go home.  He requested cough medicines will give Tessalon and requested some nausea medicine so we will give prescription for some Zofran.  He will be given Paxlovid and I spoke to pharmacy about this and they recommended a course of Paxlovid and have him stop the statin during it.  Patient and I had a shared decision-making conversation again offering admission but he and family would like to go home.  They will return if symptoms change or worsen or he feels that his short of breath is not being managed at home.  They understood return precautions and follow-up instructions and were discharged in what appeared to be stable condition.        Final Clinical Impression(s) / ED Diagnoses Final diagnoses:  COVID-19  Acute cough  Shortness of breath    Rx / DC Orders ED Discharge Orders          Ordered    nirmatrelvir & ritonavir (PAXLOVID, 300/100,) 20 x 150 MG & 10 x 100MG  TBPK  2 times daily        06/03/22 2305    ondansetron (ZOFRAN-ODT) 4 MG disintegrating tablet  Every 8 hours PRN        06/03/22 2305    benzonatate (TESSALON) 100 MG capsule  3 times daily PRN        06/03/22 2305            Clinical Impression: 1. COVID-19   2. Acute cough   3. Shortness of breath     Disposition: Discharge  Condition: Good  I have discussed the results, Dx and Tx plan with the pt(& family if present). He/she/they expressed understanding and agree(s) with the plan. Discharge instructions discussed at great length. Strict return precautions discussed and pt &/or family have verbalized understanding of the instructions. No further questions at time of discharge.    Discharge Medication List as of 06/03/2022 11:06 PM     START taking these medications   Details  benzonatate (TESSALON) 100 MG capsule Take 1 capsule (100 mg total) by mouth 3 (three) times daily  as needed for cough., Starting Wed 06/03/2022, Print    nirmatrelvir & ritonavir (PAXLOVID, 300/100,) 20 x 150 MG & 10 x 100MG  TBPK Take 3 tablets by mouth 2 (two) times daily for 5 days., Starting Wed 06/03/2022, Until Mon 06/08/2022, Print    ondansetron (ZOFRAN-ODT) 4 MG disintegrating tablet Take 1 tablet (4 mg total) by mouth every 8 (eight) hours as needed for nausea or vomiting., Starting Wed 06/03/2022, Print        Follow Up: Donato Schultz, DO [address] [place] [address] [phone]     [facility] Emergency Department at [facility] [address] [id number] mc [address] [place] [address] [phone]       Cristino Degroff, Canary Brim, MD 06/03/22 2340

## 2022-06-03 NOTE — Discharge Instructions (Signed)
Your history, exam, workup today revealed you are positive for COVID-19.  I suspect this is led to your constellation of symptoms.  Your exam did not seem consistent with acute stroke and we agreed together with a shared decision-making conversation to hold on CT imaging or advanced imaging of your head.  Your x-ray did not show pneumonia and your labs were overall reassuring aside from some degree of dehydration and the positive COVID test.  You did have intermittent hypoxia with oxygen saturations dropping into the 80s but it quickly resolved.  We offered to admit for oxygen supplementation and further management and monitoring but you would prefer to go home.  Please take the Paxlovid as recommended by the pharmacy team and hold your Crestor while you are taking the Paxlovid.  Please use the nausea medicine to help with nausea and cough medicine to help with cough.  Please rest.  Please follow-up with your primary doctor.  If any symptoms change or worsen acutely, please turn to the nearest emergency department for likely admission.

## 2022-06-03 NOTE — ED Triage Notes (Addendum)
Pt reports cough, runny nose, chills, vomiting, and weakness since yesterday. Pt also reports balance issues. Pt states that he "wants to make sure he isn't having another stroke". Stroke scale negative. Pt ambulatory to triage.

## 2022-06-12 ENCOUNTER — Encounter: Payer: Self-pay | Admitting: Family Medicine

## 2022-06-16 ENCOUNTER — Ambulatory Visit (INDEPENDENT_AMBULATORY_CARE_PROVIDER_SITE_OTHER): Payer: Medicare HMO | Admitting: Family Medicine

## 2022-06-16 ENCOUNTER — Encounter: Payer: Self-pay | Admitting: Family Medicine

## 2022-06-16 VITALS — BP 94/62 | HR 83 | Ht 72.0 in | Wt 182.0 lb

## 2022-06-16 DIAGNOSIS — R5383 Other fatigue: Secondary | ICD-10-CM | POA: Diagnosis not present

## 2022-06-16 DIAGNOSIS — E871 Hypo-osmolality and hyponatremia: Secondary | ICD-10-CM

## 2022-06-16 LAB — BASIC METABOLIC PANEL
BUN: 31 mg/dL — ABNORMAL HIGH (ref 6–23)
CO2: 29 mEq/L (ref 19–32)
Calcium: 9.4 mg/dL (ref 8.4–10.5)
Chloride: 96 mEq/L (ref 96–112)
Creatinine, Ser: 1.08 mg/dL (ref 0.40–1.50)
GFR: 65.69 mL/min (ref >60.00)
Glucose, Bld: 102 mg/dL — ABNORMAL HIGH (ref 70–99)
Potassium: 4.6 mEq/L (ref 3.5–5.1)
Sodium: 133 mEq/L — ABNORMAL LOW (ref 135–145)

## 2022-06-16 NOTE — Patient Instructions (Signed)
Labs today     Stay well hydrated

## 2022-06-16 NOTE — Progress Notes (Signed)
   Acute Office Visit  Subjective:     Patient ID: Antonio Stephens, male    DOB: [birth year], 77 y.o.   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   Patient reports he had COVID about 2 weeks ago. States his cough has improved and is now only having rare productive cough, maybe once every 2-3 days; improving overall. He denies chest pain, dyspnea, wheezing, fever, hemoptysis. Reports his only concern is that he still feels more fatigued than normal, generalized weakness, and decreased stamina. His only different medication is recently increasing sertraline with his [facility] provider.      ROS All review of systems negative except what is listed in the HPI      Objective:    BP 94/62   Pulse 83   Ht 6' (1.829 m)   Wt 182 lb (82.6 kg)   SpO2 96%   BMI 24.68 kg/m    Physical Exam Vitals reviewed.  Constitutional:      Appearance: Normal appearance.  Cardiovascular:     Rate and Rhythm: Normal rate and regular rhythm.     Pulses: Normal pulses.     Heart sounds: Normal heart sounds.  Pulmonary:     Effort: Pulmonary effort is normal.     Breath sounds: Normal breath sounds. No wheezing, rhonchi or rales.  Skin:    General: Skin is warm and dry.  Neurological:     Mental Status: He is alert and oriented to person, place, and time.  Psychiatric:        Mood and Affect: Mood normal.        Behavior: Behavior normal.        Thought Content: Thought content normal.        Judgment: Judgment normal.     No results found for any visits on 06/16/22.      Assessment & Plan:   Problem List Items Addressed This Visit   None Visit Diagnoses     Fatigue, unspecified type    -  Primary   Relevant Orders   Basic Metabolic Panel (BMET)   Hyponatremia       Relevant Orders   Basic Metabolic Panel (BMET)      Stable exam today. Discussed symptoms that commonly linger  following COVID infections. Blood pressure is slightly decreased today - we discussed adequate hydration and hold parameters for antihypertensives. Increase oral fluids, rest, activity as tolerated, healthy diet, repeat metabolic panel today. Patient aware of signs/symptoms requiring further/urgent evaluation.       No orders of the defined types were placed in this encounter.   Return if symptoms worsen or fail to improve.  Clayborne Dana, NP

## 2022-06-19 ENCOUNTER — Encounter: Payer: Self-pay | Admitting: Family Medicine

## 2022-06-19 DIAGNOSIS — R799 Abnormal finding of blood chemistry, unspecified: Secondary | ICD-10-CM

## 2022-07-20 DIAGNOSIS — H04123 Dry eye syndrome of bilateral lacrimal glands: Secondary | ICD-10-CM | POA: Diagnosis not present

## 2022-07-20 DIAGNOSIS — I10 Essential (primary) hypertension: Secondary | ICD-10-CM | POA: Diagnosis not present

## 2022-07-20 DIAGNOSIS — H401231 Low-tension glaucoma, bilateral, mild stage: Secondary | ICD-10-CM | POA: Diagnosis not present

## 2022-09-01 DIAGNOSIS — C61 Malignant neoplasm of prostate: Secondary | ICD-10-CM | POA: Diagnosis not present

## 2022-10-08 ENCOUNTER — Encounter: Payer: Self-pay | Admitting: Family Medicine

## 2022-10-08 ENCOUNTER — Ambulatory Visit (INDEPENDENT_AMBULATORY_CARE_PROVIDER_SITE_OTHER): Payer: Medicare HMO | Admitting: Family Medicine

## 2022-10-08 VITALS — BP 108/70 | HR 74 | Temp 98.5°F | Resp 18 | Ht 72.0 in | Wt 186.4 lb

## 2022-10-08 DIAGNOSIS — Z0000 Encounter for general adult medical examination without abnormal findings: Secondary | ICD-10-CM | POA: Diagnosis not present

## 2022-10-08 DIAGNOSIS — E785 Hyperlipidemia, unspecified: Secondary | ICD-10-CM | POA: Diagnosis not present

## 2022-10-08 DIAGNOSIS — I10 Essential (primary) hypertension: Secondary | ICD-10-CM | POA: Diagnosis not present

## 2022-10-08 DIAGNOSIS — C61 Malignant neoplasm of prostate: Secondary | ICD-10-CM | POA: Diagnosis not present

## 2022-10-08 NOTE — Progress Notes (Signed)
Established Patient Office Visit  Subjective   Patient ID: Antonio Stephens, male    DOB: 1945-10-25  Age: 77 y.o. MRN: [id number]  Chief Complaint  Patient presents with   Annual Exam    Pt states not fasting     HPI Discussed the use of AI scribe software for clinical note transcription with the patient, who gave verbal consent to proceed.  History of Present Illness   The patient, with a history of prostate cancer and foot issues, presents with a black and blue toe. The discoloration appeared spontaneously and is not associated with any trauma or pain. The patient reports that the discoloration seems to be growing out. The patient also mentions that he has stopped taking aspirin due to bleeding issues when he gets minor injuries.  The patient's wife, who is not present, has been dealing with a non-healing wound on her heel. The patient reports that his wife was not taking the prescribed medication due to its taste, but he has encouraged her to start taking it again. The patient believes that the wound is improving, with less dead skin and more redness visible.  The patient also discusses his satisfaction with previous advice from the doctor, including a referral for a prostate check that led to the diagnosis and treatment of an aggressive prostate cancer, and a recommendation for shoe inserts that have significantly improved his foot comfort.      Patient Active Problem List   Diagnosis Date Noted   Plantar fasciitis 03/22/2022   Morton's neuralgia, left 03/22/2022   Non-recurrent acute suppurative otitis media of left ear without spontaneous rupture of tympanic membrane 08/27/2020   Anxiety 08/27/2020   Change in mole 06/21/2020   COVID-19 06/06/2020   Spider bite 09/23/2018   Bronchitis 12/07/2017   Prostate cancer (HCC) 04/02/2016   Preventative health care 04/02/2016   Frequent headaches 11/14/2015   Olecranon bursitis of right elbow 04/11/2013   Effusion of olecranon  bursa 04/10/2013   Soft tissue mass, right subscapular 02/13/2013   Onychomycosis 10/16/2011   LOC OSTEOARTHROS NOT SPEC PRIM/SEC PELV RGN&THI 12/10/2009   HYPERGLYCEMIA, FASTING 12/10/2009   FREQUENCY, URINARY 09/05/2009   Primary hypertension 07/19/2009   Osteoarthritis 07/19/2009   Depression 02/05/2009   DIASTOLIC DYSFUNCTION 01/18/2009   History of cardiovascular disorder 12/25/2008   Hyperlipidemia 11/18/2006   Backache 10/14/2006   Past Medical History:  Diagnosis Date   Back pain    COVID-19 10/2018   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, 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   Depression    GERD (gastroesophageal reflux disease)    Glaucoma    bilateral   Hyperlipidemia    Hypertension    Prostate cancer (HCC) 2016   Urinary retention    Past Surgical History:  Procedure Laterality Date   COLONOSCOPY     HIP RESURFACING Right 11/04/2015   inguinal Herniorrhaphy     left   KNEE ARTHROSCOPY  04/2005   right   KNEE SURGERY  09-2009   partial replacemente , R   PARTIAL KNEE ARTHROPLASTY Left 10.10.16   PROSTATE BIOPSY  02/2016   TOTAL HIP ARTHROPLASTY  12/16/09   left hip   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     benign/fatty tumor   Social History   Tobacco Use   Smoking status: Former    Current packs/day: 0.00    Types: Cigarettes    Start date: 07/04/1967  Quit date: 09/15/1967    Years since quitting: 55.1   Smokeless tobacco: Never  Vaping Use   Vaping status: Never Used  Substance Use Topics   Alcohol use: Not Currently    Comment: occasionally   Drug use: No   Social History   Socioeconomic History   Marital status: Married    Spouse name: Not on file   Number of children: Not on file   Years of education: Not on file   Highest education level: Bachelor's degree (e.g., BA, AB, BS)  Occupational History   Occupation: Systems analyst  Tobacco Use   Smoking status: Former    Current packs/day: 0.00     Types: Cigarettes    Start date: 07/04/1967    Quit date: 09/15/1967    Years since quitting: 55.1   Smokeless tobacco: Never  Vaping Use   Vaping status: Never Used  Substance and Sexual Activity   Alcohol use: Not Currently    Comment: occasionally   Drug use: No   Sexual activity: Yes  Other Topics Concern   Not on file  Social History Narrative   Not on file   Social Determinants of Health   Financial Resource Strain: Low Risk  (06/15/2022)   Overall Financial Resource Strain (CARDIA)    Difficulty of Paying Living Expenses: Not hard at all  Food Insecurity: No Food Insecurity (06/15/2022)   Hunger Vital Sign    Worried About Running Out of Food in the Last Year: Never true    Ran Out of Food in the Last Year: Never true  Transportation Needs: No Transportation Needs (06/15/2022)   PRAPARE - Administrator, Civil Service (Medical): No    Lack of Transportation (Non-Medical): No  Physical Activity: Insufficiently Active (06/15/2022)   Exercise Vital Sign    Days of Exercise per Week: 3 days    Minutes of Exercise per Session: 30 min  Stress: No Stress Concern Present (06/15/2022)   [employer] of Occupational Health - Occupational Stress Questionnaire    Feeling of Stress : Not at all  Social Connections: Socially Integrated (06/15/2022)   Social Connection and Isolation Panel [NHANES]    Frequency of Communication with Friends and Family: More than three times a week    Frequency of Social Gatherings with Friends and Family: Once a week    Attends Religious Services: More than 4 times per year    Active Member of [employer] or Organizations: Yes    Attends Banker Meetings: 1 to 4 times per year    Marital Status: Married  Catering manager Violence: Not on file   Family Status  Relation Name Status   Mother  Deceased at age 6   Father  Deceased   Sister  Alive   Brother  Alive   MGM  Deceased   MGF  Deceased   PGM  Deceased   PGF   Deceased   Neg Hx  (Not Specified)  No partnership data on file   Family History  Problem Relation Age of Onset   Alzheimer's disease Mother 13   Cancer Father 36       sm cell carcinoma lung   Cancer Sister 28       breast   Breast cancer Sister    Hyperlipidemia Paternal Grandfather    Colon cancer Neg Hx    Stomach cancer Neg Hx    Esophageal cancer Neg Hx    Rectal cancer Neg Hx  No Known Allergies    Review of Systems  Constitutional:  Negative for chills, fever and malaise/fatigue.  HENT:  Negative for congestion and hearing loss.   Eyes:  Negative for blurred vision and discharge.  Respiratory:  Negative for cough, sputum production and shortness of breath.   Cardiovascular:  Negative for chest pain, palpitations and leg swelling.  Gastrointestinal:  Negative for abdominal pain, blood in stool, constipation, diarrhea, heartburn, nausea and vomiting.  Genitourinary:  Negative for dysuria, frequency, hematuria and urgency.  Musculoskeletal:  Negative for back pain, falls and myalgias.  Skin:  Negative for rash.  Neurological:  Negative for dizziness, sensory change, loss of consciousness, weakness and headaches.  Endo/Heme/Allergies:  Negative for environmental allergies. Does not bruise/bleed easily.  Psychiatric/Behavioral:  Negative for depression and suicidal ideas. The patient is not nervous/anxious and does not have insomnia.       Objective:     BP 108/70 (BP Location: Right Arm, Patient Position: Sitting, Cuff Size: Normal)   Pulse 74   Temp 98.5 F (36.9 C) (Oral)   Resp 18   Ht 6' (1.829 m)   Wt 186 lb 6.4 oz (84.6 kg)   SpO2 96%   BMI 25.28 kg/m  BP Readings from Last 3 Encounters:  10/08/22 108/70  06/16/22 94/62  06/03/22 102/70   Wt Readings from Last 3 Encounters:  10/08/22 186 lb 6.4 oz (84.6 kg)  06/16/22 182 lb (82.6 kg)  06/03/22 185 lb (83.9 kg)   SpO2 Readings from Last 3 Encounters:  10/08/22 96%  06/16/22 96%  06/03/22 95%       Physical Exam Vitals and nursing note reviewed.  Constitutional:      General: He is not in acute distress.    Appearance: Normal appearance. He is well-developed.  HENT:     Head: Normocephalic and atraumatic.     Right Ear: Tympanic membrane, ear canal and external ear normal. There is no impacted cerumen.     Left Ear: Tympanic membrane, ear canal and external ear normal. There is no impacted cerumen.     Nose: Nose normal.     Mouth/Throat:     Mouth: Mucous membranes are moist.     Pharynx: Oropharynx is clear. No oropharyngeal exudate or posterior oropharyngeal erythema.  Eyes:     General: No scleral icterus.       Right eye: No discharge.        Left eye: No discharge.     Conjunctiva/sclera: Conjunctivae normal.     Pupils: Pupils are equal, round, and reactive to light.  Neck:     Thyroid: No thyromegaly.     Vascular: No JVD.  Cardiovascular:     Rate and Rhythm: Normal rate and regular rhythm.     Heart sounds: Normal heart sounds. No murmur heard. Pulmonary:     Effort: Pulmonary effort is normal. No respiratory distress.     Breath sounds: Normal breath sounds.  Abdominal:     General: Bowel sounds are normal. There is no distension.     Palpations: Abdomen is soft. There is no mass.     Tenderness: There is no abdominal tenderness. There is no guarding or rebound.  Musculoskeletal:        General: Normal range of motion.     Cervical back: Normal range of motion and neck supple.     Right lower leg: No edema.     Left lower leg: No edema.  Lymphadenopathy:  Cervical: No cervical adenopathy.  Skin:    General: Skin is warm and dry.     Findings: No erythema or rash.  Neurological:     Mental Status: He is alert and oriented to person, place, and time.     Cranial Nerves: No cranial nerve deficit.     Motor: No abnormal muscle tone.     Deep Tendon Reflexes: Reflexes are normal and symmetric. Reflexes normal.  Psychiatric:        Mood and  Affect: Mood normal.        Behavior: Behavior normal.        Thought Content: Thought content normal.        Judgment: Judgment normal.      Results for orders placed or performed in visit on 10/08/22  Lipid panel  Result Value Ref Range   Cholesterol 252 (H) 0 - 200 mg/dL   Triglycerides 166.0 (H) 0.0 - 149.0 mg/dL   HDL 63.01 >60.10 mg/dL   VLDL 93.2 (H) 0.0 - 35.5 mg/dL   LDL Cholesterol 732 (H) 0 - 99 mg/dL   Total CHOL/HDL Ratio 5    NonHDL 198.64   PSA  Result Value Ref Range   PSA 0.28 0.10 - 4.00 ng/mL  TSH  Result Value Ref Range   TSH 2.11 0.35 - 5.50 uIU/mL  Comprehensive metabolic panel  Result Value Ref Range   Sodium 141 135 - 145 mEq/L   Potassium 4.3 3.5 - 5.1 mEq/L   Chloride 100 96 - 112 mEq/L   CO2 32 19 - 32 mEq/L   Glucose, Bld 80 70 - 99 mg/dL   BUN 22 6 - 23 mg/dL   Creatinine, Ser 2.02 0.40 - 1.50 mg/dL   Total Bilirubin 0.6 0.2 - 1.2 mg/dL   Alkaline Phosphatase 84 39 - 117 U/L   AST 15 0 - 37 U/L   ALT 11 0 - 53 U/L   Total Protein 6.2 6.0 - 8.3 g/dL   Albumin 4.2 3.5 - 5.2 g/dL   GFR 54.27 >06.23 mL/min   Calcium 9.3 8.4 - 10.5 mg/dL  CBC with Differential/Platelet  Result Value Ref Range   WBC 7.9 4.0 - 10.5 K/uL   RBC 4.60 4.22 - 5.81 Mil/uL   Hemoglobin 14.7 13.0 - 17.0 g/dL   HCT 76.2 83.1 - 51.7 %   MCV 95.1 78.0 - 100.0 fl   MCHC 33.5 30.0 - 36.0 g/dL   RDW 61.6 07.3 - 71.0 %   Platelets 208.0 150.0 - 400.0 K/uL   Neutrophils Relative % 56.0 43.0 - 77.0 %   Lymphocytes Relative 31.5 12.0 - 46.0 %   Monocytes Relative 8.8 3.0 - 12.0 %   Eosinophils Relative 2.6 0.0 - 5.0 %   Basophils Relative 1.1 0.0 - 3.0 %   Neutro Abs 4.5 1.4 - 7.7 K/uL   Lymphs Abs 2.5 0.7 - 4.0 K/uL   Monocytes Absolute 0.7 0.1 - 1.0 K/uL   Eosinophils Absolute 0.2 0.0 - 0.7 K/uL   Basophils Absolute 0.1 0.0 - 0.1 K/uL  Apolipoprotein B  Result Value Ref Range   Apolipoprotein B 142 (H) <90 mg/dL    Last CBC Lab Results  Component Value Date    WBC 7.9 10/08/2022   HGB 14.7 10/08/2022   HCT 43.7 10/08/2022   MCV 95.1 10/08/2022   MCH 31.4 06/03/2022   RDW 14.2 10/08/2022   PLT 208.0 10/08/2022   Last metabolic panel Lab Results  Component Value Date  GLUCOSE 80 10/08/2022   NA 141 10/08/2022   K 4.3 10/08/2022   CL 100 10/08/2022   CO2 32 10/08/2022   BUN 22 10/08/2022   CREATININE 1.01 10/08/2022   GFR 71.03 10/08/2022   CALCIUM 9.3 10/08/2022   PHOS 3.8 05/04/2012   PROT 6.2 10/08/2022   ALBUMIN 4.2 10/08/2022   BILITOT 0.6 10/08/2022   ALKPHOS 84 10/08/2022   AST 15 10/08/2022   ALT 11 10/08/2022   ANIONGAP 9 06/03/2022   Last lipids Lab Results  Component Value Date   CHOL 252 (H) 10/08/2022   HDL 53.40 10/08/2022   LDLCALC 146 (H) 10/08/2022   LDLDIRECT 83.0 06/16/2021   TRIG 262.0 (H) 10/08/2022   CHOLHDL 5 10/08/2022   Last hemoglobin A1c Lab Results  Component Value Date   HGBA1C 5.6 12/06/2018   Last thyroid functions Lab Results  Component Value Date   TSH 2.11 10/08/2022   Last vitamin D Lab Results  Component Value Date   VD25OH 50.03 06/16/2021   Last vitamin B12 and Folate Lab Results  Component Value Date   VITAMINB12 305 03/21/2020    The ASCVD Risk score (Arnett DK, et al., 2019) failed to calculate for the following reasons:   The patient has a prior MI or stroke diagnosis    Assessment & Plan:   Problem List Items Addressed This Visit       Unprioritized   Prostate cancer [facility])    Per urology      Primary hypertension    Well controlled, no changes to meds. Encouraged heart healthy diet such as the DASH diet and exercise as tolerated.        Preventative health care - Primary    Ghm utd Check labs  See AVS Health Maintenance  Topic Date Due   Medicare Annual Wellness (AWV)  04/02/2017   INFLUENZA VACCINE  08/06/2022   COVID-19 Vaccine (3 - Moderna risk series) 06/16/2023 (Originally 04/14/2020)   Colonoscopy  05/08/2026   DTaP/Tdap/Td (3 - Tdap)  09/22/2028   Pneumonia Vaccine 73+ Years old  Completed   Hepatitis C Screening  Completed   Zoster Vaccines- Shingrix  Completed   HPV VACCINES  Aged Out         Relevant Orders   Lipid panel (Completed)   PSA (Completed)   TSH (Completed)   Comprehensive metabolic panel (Completed)   CBC with Differential/Platelet (Completed)   Hyperlipidemia    Encourage heart healthy diet such as MIND or DASH diet, increase exercise, avoid trans fats, simple carbohydrates and processed foods, consider a krill or fish or flaxseed oil cap daily.        Relevant Orders   Lipid panel (Completed)   Comprehensive metabolic panel (Completed)   Apolipoprotein B (Completed)   Lipoprotein A (LPA)  Assessment and Plan    Discolored Toenail Black and blue discoloration of the toenail without any known trauma. No pain reported. Possible subungual hematoma or subungual melanoma. -Advise to monitor for changes and growth. -Recommend evaluation by a podiatrist or dermatologist if discoloration does not grow out.  Foot Pain Improvement noted with use of inserts and sneakers recommended by a shoe specialist. -Continue current management.  Prostate Cancer History of aggressive prostate cancer, now resolved. -Continue regular follow-ups with urologist.  General Health Maintenance -Declined flu shot. -Plan to review blood work results, including PSA. -Continue current medications.        No follow-ups on file.    Donato Schultz, DO

## 2022-10-09 LAB — PSA: PSA: 0.28 ng/mL (ref 0.10–4.00)

## 2022-10-09 LAB — LIPID PANEL
Cholesterol: 252 mg/dL — ABNORMAL HIGH (ref 0–200)
HDL: 53.4 mg/dL (ref >39.00)
LDL Cholesterol: 146 mg/dL — ABNORMAL HIGH (ref 0–99)
NonHDL: 198.64
Total CHOL/HDL Ratio: 5
Triglycerides: 262 mg/dL — ABNORMAL HIGH (ref 0.0–149.0)
VLDL: 52.4 mg/dL — ABNORMAL HIGH (ref 0.0–40.0)

## 2022-10-09 LAB — COMPREHENSIVE METABOLIC PANEL
ALT: 11 U/L (ref 0–53)
AST: 15 U/L (ref 0–37)
Albumin: 4.2 g/dL (ref 3.5–5.2)
Alkaline Phosphatase: 84 U/L (ref 39–117)
BUN: 22 mg/dL (ref 6–23)
CO2: 32 meq/L (ref 19–32)
Calcium: 9.3 mg/dL (ref 8.4–10.5)
Chloride: 100 meq/L (ref 96–112)
Creatinine, Ser: 1.01 mg/dL (ref 0.40–1.50)
GFR: 71.03 mL/min (ref >60.00)
Glucose, Bld: 80 mg/dL (ref 70–99)
Potassium: 4.3 meq/L (ref 3.5–5.1)
Sodium: 141 meq/L (ref 135–145)
Total Bilirubin: 0.6 mg/dL (ref 0.2–1.2)
Total Protein: 6.2 g/dL (ref 6.0–8.3)

## 2022-10-09 LAB — CBC WITH DIFFERENTIAL/PLATELET
Basophils Absolute: 0.1 10*3/uL (ref 0.0–0.1)
Basophils Relative: 1.1 % (ref 0.0–3.0)
Eosinophils Absolute: 0.2 10*3/uL (ref 0.0–0.7)
Eosinophils Relative: 2.6 % (ref 0.0–5.0)
HCT: 43.7 % (ref 39.0–52.0)
Hemoglobin: 14.7 g/dL (ref 13.0–17.0)
Lymphocytes Relative: 31.5 % (ref 12.0–46.0)
Lymphs Abs: 2.5 10*3/uL (ref 0.7–4.0)
MCHC: 33.5 g/dL (ref 30.0–36.0)
MCV: 95.1 fL (ref 78.0–100.0)
Monocytes Absolute: 0.7 10*3/uL (ref 0.1–1.0)
Monocytes Relative: 8.8 % (ref 3.0–12.0)
Neutro Abs: 4.5 10*3/uL (ref 1.4–7.7)
Neutrophils Relative %: 56 % (ref 43.0–77.0)
Platelets: 208 10*3/uL (ref 150.0–400.0)
RBC: 4.6 Mil/uL (ref 4.22–5.81)
RDW: 14.2 % (ref 11.5–15.5)
WBC: 7.9 10*3/uL (ref 4.0–10.5)

## 2022-10-09 LAB — APOLIPOPROTEIN B: Apolipoprotein B: 142 mg/dL — ABNORMAL HIGH (ref <90)

## 2022-10-09 LAB — TSH: TSH: 2.11 u[IU]/mL (ref 0.35–5.50)

## 2022-10-09 NOTE — Assessment & Plan Note (Signed)
Ghm utd Check labs  See AVS Health Maintenance  Topic Date Due   Medicare Annual Wellness (AWV)  04/02/2017   INFLUENZA VACCINE  08/06/2022   COVID-19 Vaccine (3 - Moderna risk series) 06/16/2023 (Originally 04/14/2020)   Colonoscopy  05/08/2026   DTaP/Tdap/Td (3 - Tdap) 09/22/2028   Pneumonia Vaccine 25+ Years old  Completed   Hepatitis C Screening  Completed   Zoster Vaccines- Shingrix  Completed   HPV VACCINES  Aged Out

## 2022-10-09 NOTE — Assessment & Plan Note (Signed)
Well controlled, no changes to meds. Encouraged heart healthy diet such as the DASH diet and exercise as tolerated.  °

## 2022-10-09 NOTE — Assessment & Plan Note (Signed)
Encourage heart healthy diet such as MIND or DASH diet, increase exercise, avoid trans fats, simple carbohydrates and processed foods, consider a krill or fish or flaxseed oil cap daily.  °

## 2022-10-09 NOTE — Assessment & Plan Note (Signed)
Per u rology 

## 2022-10-12 LAB — LIPOPROTEIN A (LPA): Lipoprotein (a): <10 nmol/L (ref <75)

## 2022-10-14 ENCOUNTER — Encounter: Payer: Self-pay | Admitting: Family Medicine

## 2022-10-14 ENCOUNTER — Other Ambulatory Visit: Payer: Self-pay | Admitting: Family Medicine

## 2022-10-14 DIAGNOSIS — E785 Hyperlipidemia, unspecified: Secondary | ICD-10-CM

## 2022-10-15 MED ORDER — ROSUVASTATIN CALCIUM 10 MG PO TABS: 10.0000 mg | ORAL_TABLET | Freq: Every day | ORAL | 2 refills | Status: DC

## 2023-01-13 ENCOUNTER — Other Ambulatory Visit: Payer: Self-pay | Admitting: Family Medicine

## 2023-03-02 DIAGNOSIS — C61 Malignant neoplasm of prostate: Secondary | ICD-10-CM | POA: Diagnosis not present

## 2023-04-29 ENCOUNTER — Other Ambulatory Visit: Payer: Self-pay | Admitting: Pharmacist

## 2023-04-29 ENCOUNTER — Encounter: Payer: Self-pay | Admitting: Pharmacist

## 2023-04-29 DIAGNOSIS — I10 Essential (primary) hypertension: Secondary | ICD-10-CM

## 2023-04-29 MED ORDER — LISINOPRIL 20 MG PO TABS
20.0000 mg | ORAL_TABLET | Freq: Every day | ORAL | 1 refills | Status: DC
Start: 2023-04-29 — End: 2023-11-08

## 2023-04-29 NOTE — Progress Notes (Signed)
 Pharmacy Quality Measure Review  This patient is appearing on a report for being at risk of failing the adherence measure for hypertension (ACEi/ARB) medications this calendar year.   Medication: lisinopril  10mg  Last fill date: 01/13/2023 for 30 day supply  Spoke with Mr Antonio Stephens and he reports he had requested a refill from his pharmacy but had not gotten notification that lisinopril  had been filled. He assumed that the pharmacy had reached out to us  to get a new Rx.   I also noticed that Mr. Antonio Stephens is past due to have lipids rechecked after restarting rosuvastatin  in October 2024.   Assessment:  Low adherence to lisinopril  - need for updated Rx.   Plan:  Discussed barriers to adherence, which included need for updated Rx. Sent in lisinopril  10mg  once daily #90 with 1refill.  Reviewed medication indication, dosing, and goals of therapy.    Cecilie Coffee, PharmD Clinical Pharmacist [facility]  Population Health [phone]

## 2023-05-03 ENCOUNTER — Other Ambulatory Visit

## 2023-05-10 ENCOUNTER — Other Ambulatory Visit (INDEPENDENT_AMBULATORY_CARE_PROVIDER_SITE_OTHER)

## 2023-05-10 DIAGNOSIS — E785 Hyperlipidemia, unspecified: Secondary | ICD-10-CM

## 2023-05-10 LAB — LIPID PANEL
Cholesterol: 165 mg/dL (ref 0–200)
HDL: 44.4 mg/dL (ref >39.00)
LDL Cholesterol: 105 mg/dL — ABNORMAL HIGH (ref 0–99)
NonHDL: 120.22
Total CHOL/HDL Ratio: 4
Triglycerides: 74 mg/dL (ref 0.0–149.0)
VLDL: 14.8 mg/dL (ref 0.0–40.0)

## 2023-05-10 LAB — COMPREHENSIVE METABOLIC PANEL WITH GFR
ALT: 12 U/L (ref 0–53)
AST: 18 U/L (ref 0–37)
Albumin: 4 g/dL (ref 3.5–5.2)
Alkaline Phosphatase: 53 U/L (ref 39–117)
BUN: 23 mg/dL (ref 6–23)
CO2: 29 meq/L (ref 19–32)
Calcium: 8.8 mg/dL (ref 8.4–10.5)
Chloride: 103 meq/L (ref 96–112)
Creatinine, Ser: 0.94 mg/dL (ref 0.40–1.50)
GFR: 77.11 mL/min (ref >60.00)
Glucose, Bld: 95 mg/dL (ref 70–99)
Potassium: 4.3 meq/L (ref 3.5–5.1)
Sodium: 139 meq/L (ref 135–145)
Total Bilirubin: 0.8 mg/dL (ref 0.2–1.2)
Total Protein: 5.9 g/dL — ABNORMAL LOW (ref 6.0–8.3)

## 2023-05-11 ENCOUNTER — Other Ambulatory Visit: Payer: Self-pay | Admitting: Pharmacist

## 2023-05-11 ENCOUNTER — Encounter: Payer: Self-pay | Admitting: Pharmacist

## 2023-05-11 MED ORDER — ROSUVASTATIN CALCIUM 10 MG PO TABS: 10.0000 mg | ORAL_TABLET | Freq: Every day | ORAL | 0 refills | Status: DC

## 2023-05-11 NOTE — Telephone Encounter (Signed)
 Patient had lipids checked this week. LDL was slightly > 100 but improved. Recommended he continue rosuvastatin  10mg  daily (he did not tolerate 20mg  daily in past) and try to limit intake of fat / cholesterol and exercise more.  Recommended he follow up with PCP in 2 to 3 months.   Send in Rx for rosuvastatin  10mg  daily for #90  / 0 RF.

## 2023-05-12 ENCOUNTER — Encounter: Payer: Self-pay | Admitting: Family Medicine

## 2023-06-13 ENCOUNTER — Other Ambulatory Visit: Payer: Self-pay | Admitting: Family Medicine

## 2023-06-18 ENCOUNTER — Encounter: Payer: Self-pay | Admitting: Pharmacist

## 2023-06-18 NOTE — Progress Notes (Signed)
 Pharmacy Quality Measure Review  This patient is appearing on a report for being at risk of failing the adherence measure for cholesterol (statin) and hypertension (ACEi/ARB) medications this calendar year.   Medication: rosuvastatin  Last fill date: 04/12/2023 for 30 day supply per adherence report Reviewed Dr Anson Basta database - patient filled for 90 day supply on 05/15/2023  Medication: lisinopril  Last fill date: 05/11/2023 for 90 day supply  Insurance report was not up to date. No action needed at this time.   Cecilie Coffee, PharmD Clinical Pharmacist [facility]  Population Health [phone]

## 2023-07-23 DIAGNOSIS — H02839 Dermatochalasis of unspecified eye, unspecified eyelid: Secondary | ICD-10-CM | POA: Diagnosis not present

## 2023-07-23 DIAGNOSIS — I10 Essential (primary) hypertension: Secondary | ICD-10-CM | POA: Diagnosis not present

## 2023-07-23 DIAGNOSIS — H04123 Dry eye syndrome of bilateral lacrimal glands: Secondary | ICD-10-CM | POA: Diagnosis not present

## 2023-07-23 DIAGNOSIS — H401231 Low-tension glaucoma, bilateral, mild stage: Secondary | ICD-10-CM | POA: Diagnosis not present

## 2023-09-14 DIAGNOSIS — R102 Pelvic and perineal pain: Secondary | ICD-10-CM | POA: Diagnosis not present

## 2023-09-14 DIAGNOSIS — M25561 Pain in right knee: Secondary | ICD-10-CM | POA: Diagnosis not present

## 2023-09-14 DIAGNOSIS — M25562 Pain in left knee: Secondary | ICD-10-CM | POA: Diagnosis not present

## 2023-10-11 ENCOUNTER — Ambulatory Visit (INDEPENDENT_AMBULATORY_CARE_PROVIDER_SITE_OTHER): Admitting: Family Medicine

## 2023-10-11 ENCOUNTER — Encounter: Payer: Self-pay | Admitting: Family Medicine

## 2023-10-11 ENCOUNTER — Ambulatory Visit (HOSPITAL_BASED_OUTPATIENT_CLINIC_OR_DEPARTMENT_OTHER)
Admission: RE | Admit: 2023-10-11 | Discharge: 2023-10-11 | Disposition: A | Discharge status: Home / Self Care | Source: Ambulatory Visit | Attending: Family Medicine | Admitting: Family Medicine

## 2023-10-11 VITALS — BP 120/86 | HR 47 | Temp 98.1°F | Resp 16 | Ht 72.0 in | Wt 181.2 lb

## 2023-10-11 DIAGNOSIS — M25512 Pain in left shoulder: Secondary | ICD-10-CM

## 2023-10-11 DIAGNOSIS — R413 Other amnesia: Secondary | ICD-10-CM | POA: Diagnosis not present

## 2023-10-11 DIAGNOSIS — R29890 Loss of height: Secondary | ICD-10-CM

## 2023-10-11 DIAGNOSIS — M2042 Other hammer toe(s) (acquired), left foot: Secondary | ICD-10-CM

## 2023-10-11 DIAGNOSIS — Z0000 Encounter for general adult medical examination without abnormal findings: Secondary | ICD-10-CM | POA: Diagnosis not present

## 2023-10-11 DIAGNOSIS — R5383 Other fatigue: Secondary | ICD-10-CM

## 2023-10-11 DIAGNOSIS — R4781 Slurred speech: Secondary | ICD-10-CM

## 2023-10-11 DIAGNOSIS — G8929 Other chronic pain: Secondary | ICD-10-CM | POA: Diagnosis not present

## 2023-10-11 DIAGNOSIS — M19012 Primary osteoarthritis, left shoulder: Secondary | ICD-10-CM | POA: Diagnosis not present

## 2023-10-11 DIAGNOSIS — E785 Hyperlipidemia, unspecified: Secondary | ICD-10-CM

## 2023-10-11 DIAGNOSIS — Z8546 Personal history of malignant neoplasm of prostate: Secondary | ICD-10-CM | POA: Diagnosis not present

## 2023-10-11 DIAGNOSIS — I10 Essential (primary) hypertension: Secondary | ICD-10-CM

## 2023-10-11 LAB — CBC WITH DIFFERENTIAL/PLATELET
Basophils Absolute: 0 K/uL (ref 0.0–0.1)
Basophils Relative: 0.6 % (ref 0.0–3.0)
Eosinophils Absolute: 0.2 K/uL (ref 0.0–0.7)
Eosinophils Relative: 2.5 % (ref 0.0–5.0)
HCT: 42.4 % (ref 39.0–52.0)
Hemoglobin: 14.1 g/dL (ref 13.0–17.0)
Lymphocytes Relative: 32.7 % (ref 12.0–46.0)
Lymphs Abs: 2.4 K/uL (ref 0.7–4.0)
MCHC: 33.3 g/dL (ref 30.0–36.0)
MCV: 95.2 fl (ref 78.0–100.0)
Monocytes Absolute: 0.6 K/uL (ref 0.1–1.0)
Monocytes Relative: 8.5 % (ref 3.0–12.0)
Neutro Abs: 4.1 K/uL (ref 1.4–7.7)
Neutrophils Relative %: 55.7 % (ref 43.0–77.0)
Platelets: 184 K/uL (ref 150.0–400.0)
RBC: 4.45 Mil/uL (ref 4.22–5.81)
RDW: 14.5 % (ref 11.5–15.5)
WBC: 7.4 K/uL (ref 4.0–10.5)

## 2023-10-11 LAB — PSA: PSA: 0.2 ng/mL (ref 0.10–4.00)

## 2023-10-11 LAB — VITAMIN D 25 HYDROXY (VIT D DEFICIENCY, FRACTURES): VITD: 39.72 ng/mL (ref 30.00–100.00)

## 2023-10-11 LAB — TSH: TSH: 2.2 u[IU]/mL (ref 0.35–5.50)

## 2023-10-11 LAB — VITAMIN B12: Vitamin B-12: 462 pg/mL (ref 211–911)

## 2023-10-11 NOTE — Assessment & Plan Note (Signed)
 Encourage heart healthy diet such as MIND or DASH diet, increase exercise, avoid trans fats, simple carbohydrates and processed foods, consider a krill or fish or flaxseed oil cap daily.

## 2023-10-11 NOTE — Progress Notes (Signed)
 Subjective:    Patient ID: Antonio Stephens, male    DOB: 05/12/1945, 78 y.o.   MRN: [id number]  Chief Complaint  Patient presents with   Annual Exam    Pt states fasting     HPI Patient is in today for cpe.  Discussed the use of AI scribe software for clinical note transcription with the patient, who gave verbal consent to proceed.  History of Present Illness Antonio Stephens is a 78 year old male who presents with foot pain and shoulder discomfort.  He has persistent issues with his left foot, specifically a bump on the top of his left toe, which has worsened over time. He reports a bump on the top of his left toe, which has worsened over time and causes pain. He wonders if his toe problem could be similar to his wife's, who had surgery for a hammer toe.  He experiences discomfort in his shoulder, particularly a bump on the back, which he refers to as a 'side tumor' or lipoma. There is a crunching sensation during pushups and when petting a cat, though it is not consistently painful. The shoulder pops and crunches, especially during certain movements, but the pain subsides after some activity.  He has a history of depression and anxiety, managed with sertraline. He attempted to discontinue the medication in the past, which led to a significant decline in mood, prompting him to resume the medication. He feels much better while on sertraline. He reports that any feelings of being 'down' are transient and improve with coffee.  He mentions a decrease in energy levels and wonders if it could be related to aging or lingering COVID-19 symptoms. He experienced weight loss following a COVID-19 infection last year, dropping from 185 pounds to around 175-178 pounds, but has since stabilized his weight. He reports some shuffling of his feet when walking.    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, 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   Depression    GERD (gastroesophageal reflux disease)    Glaucoma    bilateral   Hyperlipidemia    Hypertension    Prostate cancer (HCC) 2016   Urinary retention     Past Surgical History:  Procedure Laterality Date   COLONOSCOPY     HIP RESURFACING Right 11/04/2015   inguinal Herniorrhaphy     left   KNEE ARTHROSCOPY  04/2005   right   KNEE SURGERY  09-2009   partial replacemente , R   PARTIAL KNEE ARTHROPLASTY Left 10.10.16   PROSTATE BIOPSY  02/2016   TOTAL HIP ARTHROPLASTY  12/16/09   left hip   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     benign/fatty tumor    Family History  Problem Relation Age of Onset   Alzheimer's disease Mother 32   Cancer Father 39       sm cell carcinoma lung   Cancer Sister 39       breast   Breast cancer Sister    Hyperlipidemia Paternal Grandfather    Colon cancer Neg Hx    Stomach cancer Neg Hx    Esophageal cancer Neg Hx    Rectal cancer Neg Hx     Social History   Socioeconomic History   Marital status: Married    Spouse name: Not on file   Number of children: Not  on file   Years of education: Not on file   Highest education level: Bachelor's degree (e.g., BA, AB, BS)  Occupational History   Occupation: Systems analyst  Tobacco Use   Smoking status: Former    Current packs/day: 0.00    Types: Cigarettes    Start date: 07/04/1967    Quit date: 09/15/1967    Years since quitting: 56.1   Smokeless tobacco: Never  Vaping Use   Vaping status: Never Used  Substance and Sexual Activity   Alcohol use: Not Currently    Comment: occasionally   Drug use: No   Sexual activity: Yes  Other Topics Concern   Not on file  Social History Narrative   Not on file   Social Drivers of Health   Financial Resource Strain: Low Risk  (10/11/2023)   Overall Financial Resource Strain (CARDIA)    Difficulty of Paying Living Expenses: Not hard at all  Food Insecurity: Unknown (10/11/2023)   Hunger  Vital Sign    Worried About Running Out of Food in the Last Year: Never true    Ran Out of Food in the Last Year: Not on file  Transportation Needs: No Transportation Needs (10/11/2023)   PRAPARE - Administrator, Civil Service (Medical): No    Lack of Transportation (Non-Medical): No  Physical Activity: Insufficiently Active (06/15/2022)   Exercise Vital Sign    Days of Exercise per Week: 3 days    Minutes of Exercise per Session: 30 min  Stress: No Stress Concern Present (06/15/2022)   [employer] of Occupational Health - Occupational Stress Questionnaire    Feeling of Stress : Not at all  Social Connections: Unknown (10/11/2023)   Social Connection and Isolation Panel    Frequency of Communication with Friends and Family: Not on file    Frequency of Social Gatherings with Friends and Family: Not on file    Attends Religious Services: Not on file    Active Member of Clubs or Organizations: Yes    Attends Banker Meetings: More than 4 times per year    Marital Status: Married  Catering manager Violence: Not on file    Outpatient Medications Prior to Visit  Medication Sig Dispense Refill   Calcium  Carbonate-Vit D-Min (CALCIUM  1200 PO) Take 4 tablets by mouth daily. Raw calcium      Cholecalciferol (VITAMIN D3) 2000 units TABS Take 1 capsule by mouth daily.     GLUCOSAMINE-CHONDROITIN-MSM PO Take 3,200 mg by mouth 2 (two) times daily.     lisinopril  (ZESTRIL ) 20 MG tablet Take 1 tablet (20 mg total) by mouth daily. 90 tablet 1   multivitamin (THERAGRAN) per tablet Take 1 tablet by mouth daily.     Omega 3 1200 MG CAPS Take 1 capsule by mouth 2 (two) times daily.     pantoprazole (PROTONIX) 40 MG tablet Take 1 tablet by mouth daily.     rosuvastatin  (CRESTOR ) 10 MG tablet Take 1 tablet (10 mg total) by mouth daily. 30 tablet 0   sertraline (ZOLOFT) 100 MG tablet Take 100 mg by mouth daily.     timolol (TIMOPTIC) 0.5 % ophthalmic solution Place 1 drop into  the left eye 2 (two) times daily.      TURMERIC PO Take 940 mg by mouth 2 (two) times daily.     No facility-administered medications prior to visit.    No Known Allergies  Review of Systems  Constitutional:  Negative for chills, fever and malaise/fatigue.  HENT:  Negative for congestion and hearing loss.   Eyes:  Negative for blurred vision and discharge.  Respiratory:  Negative for cough, sputum production and shortness of breath.   Cardiovascular:  Negative for chest pain, palpitations and leg swelling.  Gastrointestinal:  Negative for abdominal pain, blood in stool, constipation, diarrhea, heartburn, nausea and vomiting.  Genitourinary:  Negative for dysuria, frequency, hematuria and urgency.  Musculoskeletal:  Positive for joint pain. Negative for back pain, falls and myalgias.  Skin:  Negative for rash.  Neurological:  Negative for dizziness, sensory change, loss of consciousness, weakness and headaches.  Endo/Heme/Allergies:  Negative for environmental allergies. Does not bruise/bleed easily.  Psychiatric/Behavioral:  Negative for depression and suicidal ideas. The patient is not nervous/anxious and does not have insomnia.        Objective:    Physical Exam Vitals and nursing note reviewed.  Constitutional:      General: He is not in acute distress.    Appearance: Normal appearance. He is well-developed.  HENT:     Head: Normocephalic and atraumatic.     Right Ear: Tympanic membrane, ear canal and external ear normal. There is no impacted cerumen.     Left Ear: Tympanic membrane, ear canal and external ear normal. There is no impacted cerumen.     Nose: Nose normal.     Mouth/Throat:     Mouth: Mucous membranes are moist.     Pharynx: Oropharynx is clear. No oropharyngeal exudate or posterior oropharyngeal erythema.  Eyes:     General: No scleral icterus.       Right eye: No discharge.        Left eye: No discharge.     Conjunctiva/sclera: Conjunctivae normal.      Pupils: Pupils are equal, round, and reactive to light.  Neck:     Thyroid : No thyromegaly.     Vascular: No JVD.  Cardiovascular:     Rate and Rhythm: Normal rate and regular rhythm.     Heart sounds: Normal heart sounds. No murmur heard. Pulmonary:     Effort: Pulmonary effort is normal. No respiratory distress.     Breath sounds: Normal breath sounds.  Abdominal:     General: Bowel sounds are normal. There is no distension.     Palpations: Abdomen is soft. There is no mass.     Tenderness: There is no abdominal tenderness. There is no guarding or rebound.  Musculoskeletal:        General: Normal range of motion.     Cervical back: Normal range of motion and neck supple.     Right lower leg: No edema.     Left lower leg: No edema.     Comments: Minor bunion Hammer toes l foot   Lymphadenopathy:     Cervical: No cervical adenopathy.  Skin:    General: Skin is warm and dry.     Findings: No erythema or rash.  Neurological:     Mental Status: He is alert and oriented to person, place, and time.     Cranial Nerves: No cranial nerve deficit.     Motor: No abnormal muscle tone.     Deep Tendon Reflexes: Reflexes are normal and symmetric. Reflexes normal.  Psychiatric:        Mood and Affect: Mood normal.        Behavior: Behavior normal.        Thought Content: Thought content normal.        Judgment: Judgment normal.  BP 120/86 (BP Location: Left Arm, Patient Position: Sitting, Cuff Size: Normal)   Pulse (!) 47   Temp 98.1 F (36.7 C) (Oral)   Resp 16   Ht 6' (1.829 m)   Wt 181 lb 3.2 oz (82.2 kg)   SpO2 100%   BMI 24.58 kg/m  Wt Readings from Last 3 Encounters:  10/11/23 181 lb 3.2 oz (82.2 kg)  10/08/22 186 lb 6.4 oz (84.6 kg)  06/16/22 182 lb (82.6 kg)    Diabetic Foot Exam - Simple   No data filed    Lab Results  Component Value Date   WBC 7.9 10/08/2022   HGB 14.7 10/08/2022   HCT 43.7 10/08/2022   PLT 208.0 10/08/2022   GLUCOSE 95 05/10/2023    CHOL 165 05/10/2023   TRIG 74.0 05/10/2023   HDL 44.40 05/10/2023   LDLDIRECT 83.0 06/16/2021   LDLCALC 105 (H) 05/10/2023   ALT 12 05/10/2023   AST 18 05/10/2023   NA 139 05/10/2023   K 4.3 05/10/2023   CL 103 05/10/2023   CREATININE 0.94 05/10/2023   BUN 23 05/10/2023   CO2 29 05/10/2023   TSH 2.11 10/08/2022   PSA 0.28 10/08/2022   INR 0.91 07/29/2009   HGBA1C 5.6 12/06/2018   MICROALBUR 1.2 12/10/2009    Lab Results  Component Value Date   TSH 2.11 10/08/2022   Lab Results  Component Value Date   WBC 7.9 10/08/2022   HGB 14.7 10/08/2022   HCT 43.7 10/08/2022   MCV 95.1 10/08/2022   PLT 208.0 10/08/2022   Lab Results  Component Value Date   NA 139 05/10/2023   K 4.3 05/10/2023   CO2 29 05/10/2023   GLUCOSE 95 05/10/2023   BUN 23 05/10/2023   CREATININE 0.94 05/10/2023   BILITOT 0.8 05/10/2023   ALKPHOS 53 05/10/2023   AST 18 05/10/2023   ALT 12 05/10/2023   PROT 5.9 (L) 05/10/2023   ALBUMIN 4.0 05/10/2023   CALCIUM  8.8 05/10/2023   ANIONGAP 9 06/03/2022   GFR 77.11 05/10/2023   Lab Results  Component Value Date   CHOL 165 05/10/2023   Lab Results  Component Value Date   HDL 44.40 05/10/2023   Lab Results  Component Value Date   LDLCALC 105 (H) 05/10/2023   Lab Results  Component Value Date   TRIG 74.0 05/10/2023   Lab Results  Component Value Date   CHOLHDL 4 05/10/2023   Lab Results  Component Value Date   HGBA1C 5.6 12/06/2018       Assessment & Plan:  Preventative health care Assessment & Plan: Ghm utd Check labs  See AVS Health Maintenance  Topic Date Due   Medicare Annual Wellness (AWV)  02/02/2014   COVID-19 Vaccine (3 - Moderna risk series) 10/27/2023 (Originally 04/14/2020)   Influenza Vaccine  04/04/2024 (Originally 08/06/2023)   Colonoscopy  05/08/2026   DTaP/Tdap/Td (3 - Tdap) 09/22/2028   Pneumococcal Vaccine: 50+ Years  Completed   Hepatitis C Screening  Completed   Zoster Vaccines- Shingrix  Completed    Meningococcal B Vaccine  Aged Out   Mammogram  Discontinued      Hammer toe of left foot -     Ambulatory referral to Orthopedic Surgery  Chronic left shoulder pain -     DG Shoulder Left; Future  Hyperlipidemia, unspecified hyperlipidemia type Assessment & Plan: Encourage heart healthy diet such as MIND or DASH diet, increase exercise, avoid trans fats, simple carbohydrates and processed foods, consider a  krill or fish or flaxseed oil cap daily.    Orders: -     Lipid panel -     Comprehensive metabolic panel with GFR  Primary hypertension Assessment & Plan: Well controlled, no changes to meds. Encouraged heart healthy diet such as the DASH diet and exercise as tolerated.    Orders: -     Lipid panel -     TSH -     Comprehensive metabolic panel with GFR -     CBC with Differential/Platelet  History of prostate cancer -     PSA  Loss of height -     DG Bone Density; Future  Other fatigue -     Vitamin B12 -     VITAMIN D  25 Hydroxy (Vit-D Deficiency, Fractures)  Slurred speech -     MR BRAIN WO CONTRAST; Future -     Ambulatory referral to Neurology  Memory loss -     Ambulatory referral to Neurology  Assessment and Plan Assessment & Plan Left hammer toe   He presents with a bump on the top of the left toe, consistent with early-stage hammer toe, causing discomfort, especially when wearing shoes. Refer to Dr. Kit at [facility] for further evaluation and management.  Left shoulder pain and crepitus, likely osteoarthritis   He reports crepitus and discomfort in the left shoulder, particularly during pushups and certain activities. Symptoms suggest osteoarthritis, though pain subsides after activity. No indication of joint issues beyond arthritis. Order X-ray of the left shoulder to assess for arthritis. Consider referral to Dr. Melita at [facility] if X-ray confirms arthritis.   Depression   His depression is well-managed with sertraline. He reports  significant mood improvement with medication and a noticeable decline when not taking it. He self-adjusts dosage based on how he feels but acknowledges the need for the medication.  Osteopenia (pending bone density evaluation)   He has concerns about bone density due to height loss and weight fluctuations. He takes calcium  supplements regularly. Order bone density test to evaluate for osteopenia.    Vendela Troung R Lowne Chase, DO

## 2023-10-11 NOTE — Assessment & Plan Note (Signed)
 Well controlled, no changes to meds. Encouraged heart healthy diet such as the DASH diet and exercise as tolerated.

## 2023-10-11 NOTE — Assessment & Plan Note (Signed)
 Ghm utd Check labs  See AVS Health Maintenance  Topic Date Due   Medicare Annual Wellness (AWV)  02/02/2014   COVID-19 Vaccine (3 - Moderna risk series) 10/27/2023 (Originally 04/14/2020)   Influenza Vaccine  04/04/2024 (Originally 08/06/2023)   Colonoscopy  05/08/2026   DTaP/Tdap/Td (3 - Tdap) 09/22/2028   Pneumococcal Vaccine: 50+ Years  Completed   Hepatitis C Screening  Completed   Zoster Vaccines- Shingrix  Completed   Meningococcal B Vaccine  Aged Out   Mammogram  Discontinued

## 2023-10-12 LAB — COMPREHENSIVE METABOLIC PANEL WITH GFR
ALT: 14 U/L (ref 0–53)
AST: 20 U/L (ref 0–37)
Albumin: 4.6 g/dL (ref 3.5–5.2)
Alkaline Phosphatase: 66 U/L (ref 39–117)
BUN: 21 mg/dL (ref 6–23)
CO2: 29 meq/L (ref 19–32)
Calcium: 9.7 mg/dL (ref 8.4–10.5)
Chloride: 103 meq/L (ref 96–112)
Creatinine, Ser: 0.87 mg/dL (ref 0.40–1.50)
GFR: 81.83 mL/min (ref >60.00)
Glucose, Bld: 95 mg/dL (ref 70–99)
Potassium: 5.3 meq/L — ABNORMAL HIGH (ref 3.5–5.1)
Sodium: 141 meq/L (ref 135–145)
Total Bilirubin: 0.9 mg/dL (ref 0.2–1.2)
Total Protein: 6.6 g/dL (ref 6.0–8.3)

## 2023-10-12 LAB — LIPID PANEL
Cholesterol: 190 mg/dL (ref 0–200)
HDL: 53.8 mg/dL (ref >39.00)
LDL Cholesterol: 115 mg/dL — ABNORMAL HIGH (ref 0–99)
NonHDL: 136.17
Total CHOL/HDL Ratio: 4
Triglycerides: 104 mg/dL (ref 0.0–149.0)
VLDL: 20.8 mg/dL (ref 0.0–40.0)

## 2023-10-16 ENCOUNTER — Ambulatory Visit: Payer: Self-pay | Admitting: Family Medicine

## 2023-10-17 ENCOUNTER — Ambulatory Visit (HOSPITAL_BASED_OUTPATIENT_CLINIC_OR_DEPARTMENT_OTHER)
Admission: RE | Admit: 2023-10-17 | Discharge: 2023-10-17 | Disposition: A | Discharge status: Home / Self Care | Source: Ambulatory Visit | Attending: Family Medicine | Admitting: Family Medicine

## 2023-10-17 DIAGNOSIS — R4781 Slurred speech: Secondary | ICD-10-CM | POA: Insufficient documentation

## 2023-10-31 ENCOUNTER — Other Ambulatory Visit: Payer: Self-pay | Admitting: Family Medicine

## 2023-11-01 ENCOUNTER — Other Ambulatory Visit (HOSPITAL_COMMUNITY): Payer: Self-pay

## 2023-11-08 ENCOUNTER — Other Ambulatory Visit: Payer: Self-pay

## 2023-11-08 DIAGNOSIS — I10 Essential (primary) hypertension: Secondary | ICD-10-CM

## 2023-11-08 MED ORDER — LISINOPRIL 20 MG PO TABS: 20.0000 mg | ORAL_TABLET | Freq: Every day | ORAL | 1 refills | Status: AC

## 2023-11-23 ENCOUNTER — Ambulatory Visit (HOSPITAL_BASED_OUTPATIENT_CLINIC_OR_DEPARTMENT_OTHER)
Admission: RE | Admit: 2023-11-23 | Discharge: 2023-11-23 | Disposition: A | Discharge status: Home / Self Care | Source: Ambulatory Visit | Attending: Family Medicine | Admitting: Family Medicine

## 2023-11-23 DIAGNOSIS — R29890 Loss of height: Secondary | ICD-10-CM | POA: Insufficient documentation

## 2023-11-23 DIAGNOSIS — Z87310 Personal history of (healed) osteoporosis fracture: Secondary | ICD-10-CM | POA: Diagnosis not present

## 2023-11-30 DIAGNOSIS — L570 Actinic keratosis: Secondary | ICD-10-CM | POA: Diagnosis not present

## 2023-11-30 DIAGNOSIS — L859 Epidermal thickening, unspecified: Secondary | ICD-10-CM | POA: Diagnosis not present

## 2023-11-30 DIAGNOSIS — D485 Neoplasm of uncertain behavior of skin: Secondary | ICD-10-CM | POA: Diagnosis not present

## 2023-12-01 NOTE — Progress Notes (Signed)
 Antonio Stephens                                          MRN: [id number]   12/01/2023   The [facility] Quality Team Specialist reviewed this patient medical record for the purposes of chart review for care gap closure. The following were reviewed: abstraction for care gap closure-controlling blood pressure.    [facility] Quality Team

## 2024-02-21 ENCOUNTER — Ambulatory Visit: Admitting: Neurology
